# Patient Record
Sex: Male | Born: 1940
Health system: Southern US, Community
[De-identification: ages and names within clinical notes are randomized; demographics above are authoritative.]

## PROBLEM LIST (undated history)

## (undated) DIAGNOSIS — M549 Dorsalgia, unspecified: Secondary | ICD-10-CM

## (undated) DIAGNOSIS — G8929 Other chronic pain: Secondary | ICD-10-CM

## (undated) DIAGNOSIS — N189 Chronic kidney disease, unspecified: Secondary | ICD-10-CM

## (undated) DIAGNOSIS — I10 Essential (primary) hypertension: Secondary | ICD-10-CM

## (undated) DIAGNOSIS — I4891 Unspecified atrial fibrillation: Secondary | ICD-10-CM

## (undated) DIAGNOSIS — N182 Chronic kidney disease, stage 2 (mild): Secondary | ICD-10-CM

## (undated) DIAGNOSIS — G459 Transient cerebral ischemic attack, unspecified: Secondary | ICD-10-CM

## (undated) DIAGNOSIS — I639 Cerebral infarction, unspecified: Secondary | ICD-10-CM

## (undated) DIAGNOSIS — N200 Calculus of kidney: Secondary | ICD-10-CM

## (undated) HISTORY — PX: CARDIAC CATHETERIZATION: SHX172

## (undated) HISTORY — PX: TONSILLECTOMY: SUR1361

---

## 2002-10-30 ENCOUNTER — Inpatient Hospital Stay (HOSPITAL_COMMUNITY): Admission: AD | Admit: 2002-10-30 | Discharge: 2002-11-04 | Payer: Self-pay | Admitting: *Deleted

## 2002-11-01 HISTORY — PX: CARDIOVERSION: SHX1299

## 2003-01-18 ENCOUNTER — Ambulatory Visit (HOSPITAL_COMMUNITY): Admission: RE | Admit: 2003-01-18 | Discharge: 2003-01-18 | Payer: Self-pay | Admitting: *Deleted

## 2006-09-14 ENCOUNTER — Emergency Department (HOSPITAL_COMMUNITY): Admission: EM | Admit: 2006-09-14 | Discharge: 2006-09-14 | Payer: Self-pay | Admitting: Emergency Medicine

## 2009-06-24 ENCOUNTER — Ambulatory Visit (HOSPITAL_COMMUNITY): Admission: RE | Admit: 2009-06-24 | Discharge: 2009-06-24 | Payer: Self-pay | Admitting: Cardiology

## 2009-09-09 ENCOUNTER — Emergency Department (HOSPITAL_COMMUNITY): Admission: EM | Admit: 2009-09-09 | Discharge: 2009-09-09 | Payer: Self-pay | Admitting: Emergency Medicine

## 2010-05-17 LAB — URINALYSIS, ROUTINE W REFLEX MICROSCOPIC
Bilirubin Urine: NEGATIVE
Leukocytes, UA: NEGATIVE
Specific Gravity, Urine: 1.024 (ref 1.005–1.030)

## 2010-05-17 LAB — DIFFERENTIAL
Basophils Absolute: 0.1 10*3/uL (ref 0.0–0.1)
Basophils Relative: 1 % (ref 0–1)
Eosinophils Absolute: 0.1 10*3/uL (ref 0.0–0.7)
Lymphs Abs: 1.6 10*3/uL (ref 0.7–4.0)
Monocytes Absolute: 0.7 10*3/uL (ref 0.1–1.0)
Monocytes Relative: 8 % (ref 3–12)

## 2010-05-17 LAB — URINE MICROSCOPIC-ADD ON

## 2010-05-17 LAB — BASIC METABOLIC PANEL
BUN: 20 mg/dL (ref 6–23)
Chloride: 115 mEq/L — ABNORMAL HIGH (ref 96–112)
Creatinine, Ser: 1.66 mg/dL — ABNORMAL HIGH (ref 0.4–1.5)
GFR calc Af Amer: 50 mL/min — ABNORMAL LOW (ref >60)
Glucose, Bld: 118 mg/dL — ABNORMAL HIGH (ref 70–99)
Potassium: 4.6 mEq/L (ref 3.5–5.1)
Sodium: 143 mEq/L (ref 135–145)

## 2010-05-17 LAB — CBC
MCH: 33.8 pg (ref 26.0–34.0)
RBC: 4.42 MIL/uL (ref 4.22–5.81)
WBC: 9.7 10*3/uL (ref 4.0–10.5)

## 2010-07-17 NOTE — Cardiovascular Report (Signed)
NAME:  Ross Lewis, FREDIN NO.:  [id number]   MEDICAL RECORD NO.:  [id number]                   PATIENT TYPE:  AMB   LOCATION:  ENDO                                 FACILITY:  [facility]   PHYSICIAN:  Darlin Priestly, M.D.             DATE OF BIRTH:  08-18-1940   DATE OF PROCEDURE:  01/18/2003  DATE OF DISCHARGE:                              CARDIAC CATHETERIZATION   PROCEDURES PERFORMED:  TEE-guided DC cardioversion.   ATTENDING:  Darlin Priestly, M.D.   COMPLICATIONS:  None.   INDICATIONS:  [name] is a 70 year old male patient of Evelena Peat,  M.D. with history of atrial fibrillation, who has undergone a cardiac  catheterization with no significant CAD and mildly depressed EF.  He has  been rate controlled and maintained on anticoagulation.  He is now brought  in for TEE-guided DC cardioversion in attempt to establish normal sinus  rhythm.   DESCRIPTION OF OPERATION:  After giving informed written consent, patient  brought to endoscopy suite in a fasting state.  Then received 75 mg of  Demerol and 1 mg Versed.  Underwent a successful noncomplicated  transesophageal echocardiogram.  There was normal LV size with mildly  depressed LV systolic function, estimated EF of 40-45% with mild global  hypokinesis.  The aortic valve appears to be structurally normal with  trivial aortic regurgitation.  Structurally normal mitral valve with mild  mitral regurgitation.  Structurally normal tricuspid valve with mild  tricuspid regurgitation.  There is trivial pulmonic regurgitation.  There is  no evidence of intracardiac mass or thrombus noted.  There is a small atrial  septal defect noted with minimal right to left and minimal left to right  shunting by color and contrast echocardiogram.  Normal descending thoracic  aorta.   Following the TEE, biphasic defibrillator pads were then placed on the  anterior and posterior chest wall.  Anesthesia was then  administered by Dr.  Randa Evens.  The patient then received one DC shock at 100 joules with biphasic  defibrillator with conversion to sinus bradycardia at a rate of 68.  He  maintained that and was stable.  He awoke in satisfactory condition and was  transferred to recovery room.   CONCLUSION:  Successful TEE-guided DC cardioversion.                                               Darlin Priestly, M.D.    RHM/MEDQ  D:  01/18/2003  T:  01/18/2003  Job:  [id number]   cc:   Evelena Peat, M.D.  [address]  [place]  [place] [address]  Fax: [phone]

## 2010-07-17 NOTE — Discharge Summary (Signed)
NAME:  Ross Lewis, Ross Lewis NO.:  [id number]   MEDICAL RECORD NO.:  [id number]                   PATIENT TYPE:  INP   LOCATION:  2024                                 FACILITY:  [facility]   PHYSICIAN:  Ross Lewis, M.D.             DATE OF BIRTH:  [birth year]   DATE OF ADMISSION:  10/30/2002  DATE OF DISCHARGE:  11/04/2002                                 DISCHARGE SUMMARY   PRIMARY CARE PHYSICIAN:  Ross Lewis, M.D.   ADMISSION DIAGNOSES:  1. Atrial flutter, newly diagnosed.  2. Status post cardiac catheterization.   DISCHARGE DIAGNOSES:  1. Atrial flutter, newly diagnosed.  2. Status post cardiac catheterization on November 01, 2002, revealing     normal coronary arteries, normal left ventricular function.  3. Mitral regurgitation 2+ at cardiac catheterization.  4. Status post Coumadin load.   HISTORY OF PRESENT ILLNESS:  Ross Lewis is a 70 year old, married white male  who was seen by Ross Lewis in the office for initial evaluation on October 30, 2002.  At that time he was referred by Dr. Evelena Lewis.  At that  point he had no significant past medical history.  He had seen Ross Lewis  on that past Thursday for a routine physical exam and was noted to have an  irregular heart rate.  Rhythm strips were performed and confirmed that the  patient was in atrial flutter with ventricular response of approximately  100.  He was given a prescription for Toprol and Coumadin and an appointment  was set up with Ross Lewis on October 30, 2002.  However the patient elected  not to begin the medications since he had the upcoming appointment with a  cardiologist.  He did not remember the origin of symptoms and was completely  asymptomatic from a cardiovascular stand-point.  He denied any chest pain,  shortness of breath, orthopnea, paroxysmal nocturnal dyspnea.  He had no  history of syncope or presyncope.  There was no known history of coronary  artery  disease, no history of hypothyroidism or hyperlipidemia.  He did take  an aspirin a day.   PHYSICAL EXAMINATION:  GENERAL: He was hemodynamically stable.  VITAL SIGNS: Blood pressure 140/90, heart rate was 111 and irregular.  Exam  is notable for irregular heart rhythm but otherwise was not significant.  EKG revealed atrial fibrillation/flutter, rate of 111 beats per minute and  nonspecific ST-T changes.   At that point he was advised to be seen in the office.  It was felt that Mr.  Lewis had reverted back to his atrial fibrillation/flutter with rapid  ventricular response.  Ross Lewis had a long discussion with him and  discussed the fact that he felt we needed to admit him to [facility] for anticoagulation and probable cardiac catheterization prior to  the Coumadin load and rate control.  He would have labs performed as an  outpatient.  He had some lab work which Ross Lewis did review and it had  been essentially normal.  We discussed our plans for admission to [facility]  for IV heparin, cardiac catheterization, and Coumadin load.  The  risks and benefits of the procedure were discussed with the patient and his  wife and they were willing to proceed.   HOSPITAL COURSE:  On October 31, 2002, Ross Lewis was asymptomatic.  He was  feeling well and was clinically stable.  He remained in atrial flutter on  telemetry.  He was electing cardiac catheterization.   On November 01, 2002, Ross Lewis underwent cardiac catheterization by Dr.  Nanetta Lewis.  He was found to have normal coronary arteries, ejection  fraction was 40 to 45% and he had two plus mitral regurgitation.  It was  felt the LV dysfunction was secondary to his rhythm abnormalities.  Dr.  Allyson Lewis planned to continue IV heparin post-procedure with a Coumadin load.  Plans are for discharge home with INR greater than 2.  Will consider PE  cardioversion in six to eight weeks time.  The patient had no  complications.   The next day Ross Lewis remained stable.  He had no problems with his groin  site.  He remained hemodynamically stable.  He did remain in atrial flutter  in about the 70s.  Medications were adjusted for rate control.  It was found  his insurance would cover for Lovenox and was discussed with the patient and  his wife.  The possibility of being discharged home with Lovenox injections,  Coumadin was loaded.  Ultimately they decided this was the best option.   Therefore on November 04, 2002, he was stable.  Temperature 98.1, pulse 68,  blood pressure 148/80, oxygen saturation 97% on room air.  INR is at 1.3.  CBC stable.  He remains in atrial flutter at this time on telemetry.  Right  groin is well-healed without ecchymosis or hematoma.  At this point he was  seen and evaluated by Ross Lewis who deemed the patient was ready for  discharge home with Lovenox and Coumadin cross-over therapy.   HOSPITAL CONSULTATIONS:  None.   PROCEDURES:  Cardiac catheterization performed on November 01, 2002, by Dr.  Nanetta Lewis.  This revealed normal coronary arteries and 2+ mitral  regurgitation.  Ejection fraction was 40 to 45%.  The patient tolerated the  procedure well.  He had no complications.  It was felt that the mild left  ventricular dysfunction was probably related to the arrhythmia.  Plans for  anticoagulation process and to discharge home.  Possible TE cardioversion in  six to eight week's time.   LABORATORY DATA:  Total cholesterol 142, triglycerides 76, HDL 36, LDL 91.  Cardiac enzymes showed CKs of 54, 69 and 56, MB 1.6, 1.3, 1.2.  Troponins  0.02, 0.01, 0.02.  Sodium 143, potassium 4, chloride 114, CO2 22, glucose  103, BUN 17, creatinine 1.2.  Admission pro time was 14.5, INR 1.2, PTT was  elevated at 147 with intravenous heparin.  INRs were 1.2, 1.2, 1.2, and 1.3 at discharge.  White count 5100, hemoglobin 15.3, hematocrit 44.8, platelets  183,000 and these  remained within that range throughout the hospitalization  with no significant variation.   EKG on November 02, 2002 shows atrial flutter, 88 beats per minute.  No  specific ST-T changes.  Telemetry throughout the hospitalization continued to show atrial flutter.   DISCHARGE MEDICATIONS:  1.  Coumadin 5 mg once daily.  2. Toprol XL 50 mg once daily.  3. Cardizem CD 180 mg daily.  4. Lovenox 85 units subcu q.12h.   To have his blood drawn to check a PT/INR at 0800 hours on Tuesday,  November 06, 2002, get the results by Tuesday at noon for further dosing and  instructions of Coumadin.   On Tuesday call [phone] to make an appointment to see Ross Lewis in two  weeks.          Mary B. Easley, P.A.-C.                   Ross Lewis, M.D.    MBE/MEDQ  D:  11/14/2002  T:  11/15/2002  Job:  [id number]   cc:   Ross Lewis, M.D.  [address]  [place]  [place] [address]  Fax: [phone]

## 2010-07-17 NOTE — Cardiovascular Report (Signed)
NAME:  Ross Lewis, Ross Lewis NO.:  [id number]   MEDICAL RECORD NO.:  [id number]                   PATIENT TYPE:  INP   LOCATION:  2024                                 FACILITY:  [facility]   PHYSICIAN:  Nanetta Batty, M.D.                DATE OF BIRTH:  1940/12/19   DATE OF PROCEDURE:  DATE OF DISCHARGE:                              CARDIAC CATHETERIZATION   d   INDICATIONS:  [name] is a 70 year old gentleman who was admitted on  August 31 with new onset atrial flutter.  He denied chest pain.  He will be  ruled out for myocardial infarction.  He presents for diagnostic coronary  arteriography prior to Coumadin anticoagulation.   PROCEDURE DESCRIPTION:  The patient was brought to the second floor [facility] in the postabsorptive state.  He was  premedicated with p.o. Valium.  His right groin was prepped and shaved in  the usual sterile fashion.  1% Xylocaine was used for local anesthesia.  A 6-  French sheath was inserted into the right femoral artery using standard  Seldinger technique.  6-French right and left Judkins diagnostic catheters  along with a 6-French pigtail catheter were used for selective coronary  angiography, left ventriculography, respectively.  Omnipaque dye was used  for the entirety of the case.  Retrograde aorta, left ventricular, and  pullback pressures were recorded.   HEMODYNAMICS:  1. Aortic systolic pressure 124, diastolic pressure 77.  2. Left ventricular systolic pressure 125 and diastolic pressure 14.   SELECTIVE CORONARY ANGIOGRAPHY:  1. Left main:  Normal.  2. LAD:  Normal.  3. Left circumflex:  Nondominant and normal.  4. Right coronary artery:  Dominant and normal.   LEFT VENTRICULOGRAPHY:  RAO left ventriculogram was performed using 25 mL of  Omnipaque dye at 12 mL/second in each view.  The overall LV EF was estimated  approximately 40-45% with mild global hypokinesia and 2+  mitral  regurgitation.   IMPRESSION:  [name] has new onset atrial flutter, normal coronary  arteries, and mild left ventricular dysfunction with mitral regurgitation.  I believe his left ventricular dysfunction is most likely related to his  arrhythmia.  The sheaths were removed after an ACT was measured.  Pressure  was held on the groin to achieve  hemostasis.  The patient left the laboratory in stable condition.  Plans  will be to restart heparin in four hours and begin ___________  anticoagulation tonight.  He will be discharged home once his INR is greater  than 2.0.  Plans will be for outpatient TEE, DC cardioversion in six to  eight weeks.  He left the laboratory in stable condition.  Nanetta Batty, M.D.    Cordelia Pen  D:  11/01/2002  T:  11/02/2002  Job:  [id number]   cc:   Cath Lab   [facility]   Evelena Peat, M.D.  [address]  [place]  [place] [address]  Fax: [phone]

## 2010-12-14 LAB — URINE MICROSCOPIC-ADD ON

## 2010-12-14 LAB — URINALYSIS, ROUTINE W REFLEX MICROSCOPIC
Leukocytes, UA: NEGATIVE
Specific Gravity, Urine: 1.034 — ABNORMAL HIGH

## 2010-12-14 LAB — BASIC METABOLIC PANEL
BUN: 22
CO2: 24
Calcium: 9.1
Chloride: 104
Creatinine, Ser: 1.75 — ABNORMAL HIGH
GFR calc Af Amer: 47 — ABNORMAL LOW
GFR calc non Af Amer: 39 — ABNORMAL LOW
Glucose, Bld: 135 — ABNORMAL HIGH
Potassium: 4.4
Sodium: 135

## 2011-03-09 DIAGNOSIS — I4891 Unspecified atrial fibrillation: Secondary | ICD-10-CM | POA: Diagnosis not present

## 2011-03-09 DIAGNOSIS — Z7901 Long term (current) use of anticoagulants: Secondary | ICD-10-CM | POA: Diagnosis not present

## 2011-03-10 DIAGNOSIS — N281 Cyst of kidney, acquired: Secondary | ICD-10-CM | POA: Diagnosis not present

## 2011-03-10 DIAGNOSIS — N401 Enlarged prostate with lower urinary tract symptoms: Secondary | ICD-10-CM | POA: Diagnosis not present

## 2011-03-10 DIAGNOSIS — N400 Enlarged prostate without lower urinary tract symptoms: Secondary | ICD-10-CM | POA: Diagnosis not present

## 2011-04-20 DIAGNOSIS — Z7901 Long term (current) use of anticoagulants: Secondary | ICD-10-CM | POA: Diagnosis not present

## 2011-04-20 DIAGNOSIS — I4891 Unspecified atrial fibrillation: Secondary | ICD-10-CM | POA: Diagnosis not present

## 2011-05-13 DIAGNOSIS — E782 Mixed hyperlipidemia: Secondary | ICD-10-CM | POA: Diagnosis not present

## 2011-05-13 DIAGNOSIS — I4891 Unspecified atrial fibrillation: Secondary | ICD-10-CM | POA: Diagnosis not present

## 2011-05-13 DIAGNOSIS — I10 Essential (primary) hypertension: Secondary | ICD-10-CM | POA: Diagnosis not present

## 2011-06-07 DIAGNOSIS — Z7901 Long term (current) use of anticoagulants: Secondary | ICD-10-CM | POA: Diagnosis not present

## 2011-06-07 DIAGNOSIS — I4891 Unspecified atrial fibrillation: Secondary | ICD-10-CM | POA: Diagnosis not present

## 2011-07-19 DIAGNOSIS — Z7901 Long term (current) use of anticoagulants: Secondary | ICD-10-CM | POA: Diagnosis not present

## 2011-07-19 DIAGNOSIS — I4891 Unspecified atrial fibrillation: Secondary | ICD-10-CM | POA: Diagnosis not present

## 2011-08-30 DIAGNOSIS — I4891 Unspecified atrial fibrillation: Secondary | ICD-10-CM | POA: Diagnosis not present

## 2011-08-30 DIAGNOSIS — Z7901 Long term (current) use of anticoagulants: Secondary | ICD-10-CM | POA: Diagnosis not present

## 2011-09-24 ENCOUNTER — Ambulatory Visit (HOSPITAL_COMMUNITY)
Admission: RE | Admit: 2011-09-24 | Discharge: 2011-09-24 | Disposition: A | Discharge status: Home / Self Care | Payer: Medicare Other | Source: Ambulatory Visit | Attending: Internal Medicine | Admitting: Internal Medicine

## 2011-09-24 ENCOUNTER — Other Ambulatory Visit (HOSPITAL_COMMUNITY): Payer: Self-pay | Admitting: Internal Medicine

## 2011-09-24 DIAGNOSIS — R413 Other amnesia: Secondary | ICD-10-CM

## 2011-09-24 DIAGNOSIS — R42 Dizziness and giddiness: Secondary | ICD-10-CM | POA: Insufficient documentation

## 2011-09-24 DIAGNOSIS — I4891 Unspecified atrial fibrillation: Secondary | ICD-10-CM | POA: Diagnosis not present

## 2011-09-24 DIAGNOSIS — Z8673 Personal history of transient ischemic attack (TIA), and cerebral infarction without residual deficits: Secondary | ICD-10-CM | POA: Diagnosis not present

## 2011-09-24 DIAGNOSIS — Z7901 Long term (current) use of anticoagulants: Secondary | ICD-10-CM | POA: Diagnosis not present

## 2011-09-24 DIAGNOSIS — R51 Headache: Secondary | ICD-10-CM | POA: Insufficient documentation

## 2011-09-24 DIAGNOSIS — I6350 Cerebral infarction due to unspecified occlusion or stenosis of unspecified cerebral artery: Secondary | ICD-10-CM | POA: Diagnosis not present

## 2011-10-11 DIAGNOSIS — Z7901 Long term (current) use of anticoagulants: Secondary | ICD-10-CM | POA: Diagnosis not present

## 2011-10-11 DIAGNOSIS — I4891 Unspecified atrial fibrillation: Secondary | ICD-10-CM | POA: Diagnosis not present

## 2011-11-18 DIAGNOSIS — I4891 Unspecified atrial fibrillation: Secondary | ICD-10-CM | POA: Diagnosis not present

## 2011-11-18 DIAGNOSIS — G459 Transient cerebral ischemic attack, unspecified: Secondary | ICD-10-CM | POA: Diagnosis not present

## 2011-11-18 DIAGNOSIS — I10 Essential (primary) hypertension: Secondary | ICD-10-CM | POA: Diagnosis not present

## 2011-11-22 DIAGNOSIS — I4891 Unspecified atrial fibrillation: Secondary | ICD-10-CM | POA: Diagnosis not present

## 2011-11-22 DIAGNOSIS — Z7901 Long term (current) use of anticoagulants: Secondary | ICD-10-CM | POA: Diagnosis not present

## 2011-12-30 DIAGNOSIS — Z7901 Long term (current) use of anticoagulants: Secondary | ICD-10-CM | POA: Diagnosis not present

## 2011-12-30 DIAGNOSIS — I4891 Unspecified atrial fibrillation: Secondary | ICD-10-CM | POA: Diagnosis not present

## 2012-02-10 DIAGNOSIS — Z7901 Long term (current) use of anticoagulants: Secondary | ICD-10-CM | POA: Diagnosis not present

## 2012-02-10 DIAGNOSIS — I4891 Unspecified atrial fibrillation: Secondary | ICD-10-CM | POA: Diagnosis not present

## 2012-03-17 DIAGNOSIS — I4891 Unspecified atrial fibrillation: Secondary | ICD-10-CM | POA: Diagnosis not present

## 2012-03-17 DIAGNOSIS — Z7901 Long term (current) use of anticoagulants: Secondary | ICD-10-CM | POA: Diagnosis not present

## 2012-04-27 DIAGNOSIS — I4891 Unspecified atrial fibrillation: Secondary | ICD-10-CM | POA: Diagnosis not present

## 2012-04-27 DIAGNOSIS — Z7901 Long term (current) use of anticoagulants: Secondary | ICD-10-CM | POA: Diagnosis not present

## 2012-04-29 HISTORY — PX: TRANSTHORACIC ECHOCARDIOGRAM: SHX275

## 2012-05-01 ENCOUNTER — Emergency Department (HOSPITAL_COMMUNITY): Payer: Medicare Other

## 2012-05-01 ENCOUNTER — Encounter (HOSPITAL_COMMUNITY): Payer: Self-pay | Admitting: Vascular Surgery

## 2012-05-01 ENCOUNTER — Inpatient Hospital Stay (HOSPITAL_COMMUNITY)
Admission: EM | Admit: 2012-05-01 | Discharge: 2012-05-03 | DRG: 066 | Disposition: A | Discharge status: Home / Self Care | Payer: Medicare Other | Attending: Internal Medicine | Admitting: Internal Medicine

## 2012-05-01 DIAGNOSIS — M79609 Pain in unspecified limb: Secondary | ICD-10-CM | POA: Diagnosis not present

## 2012-05-01 DIAGNOSIS — M545 Low back pain, unspecified: Secondary | ICD-10-CM | POA: Diagnosis present

## 2012-05-01 DIAGNOSIS — E785 Hyperlipidemia, unspecified: Secondary | ICD-10-CM | POA: Diagnosis present

## 2012-05-01 DIAGNOSIS — I4891 Unspecified atrial fibrillation: Secondary | ICD-10-CM | POA: Diagnosis present

## 2012-05-01 DIAGNOSIS — Z7901 Long term (current) use of anticoagulants: Secondary | ICD-10-CM | POA: Diagnosis not present

## 2012-05-01 DIAGNOSIS — M6281 Muscle weakness (generalized): Secondary | ICD-10-CM | POA: Diagnosis not present

## 2012-05-01 DIAGNOSIS — Z79899 Other long term (current) drug therapy: Secondary | ICD-10-CM

## 2012-05-01 DIAGNOSIS — R5383 Other fatigue: Secondary | ICD-10-CM | POA: Diagnosis not present

## 2012-05-01 DIAGNOSIS — Z8673 Personal history of transient ischemic attack (TIA), and cerebral infarction without residual deficits: Secondary | ICD-10-CM

## 2012-05-01 DIAGNOSIS — I10 Essential (primary) hypertension: Secondary | ICD-10-CM | POA: Diagnosis present

## 2012-05-01 DIAGNOSIS — R29898 Other symptoms and signs involving the musculoskeletal system: Secondary | ICD-10-CM | POA: Diagnosis not present

## 2012-05-01 DIAGNOSIS — I6350 Cerebral infarction due to unspecified occlusion or stenosis of unspecified cerebral artery: Secondary | ICD-10-CM | POA: Diagnosis not present

## 2012-05-01 DIAGNOSIS — G459 Transient cerebral ischemic attack, unspecified: Secondary | ICD-10-CM

## 2012-05-01 DIAGNOSIS — I482 Chronic atrial fibrillation, unspecified: Secondary | ICD-10-CM

## 2012-05-01 DIAGNOSIS — R29818 Other symptoms and signs involving the nervous system: Secondary | ICD-10-CM | POA: Diagnosis not present

## 2012-05-01 DIAGNOSIS — R404 Transient alteration of awareness: Secondary | ICD-10-CM | POA: Diagnosis not present

## 2012-05-01 DIAGNOSIS — R5381 Other malaise: Secondary | ICD-10-CM | POA: Diagnosis not present

## 2012-05-01 HISTORY — DX: Cerebral infarction, unspecified: I63.9

## 2012-05-01 HISTORY — DX: Unspecified atrial fibrillation: I48.91

## 2012-05-01 HISTORY — DX: Other chronic pain: G89.29

## 2012-05-01 HISTORY — DX: Dorsalgia, unspecified: M54.9

## 2012-05-01 HISTORY — DX: Essential (primary) hypertension: I10

## 2012-05-01 HISTORY — DX: Transient cerebral ischemic attack, unspecified: G45.9

## 2012-05-01 HISTORY — DX: Calculus of kidney: N20.0

## 2012-05-01 LAB — POCT I-STAT TROPONIN I: Troponin i, poc: 0.01 ng/mL (ref 0.00–0.08)

## 2012-05-01 LAB — URINALYSIS, ROUTINE W REFLEX MICROSCOPIC
Glucose, UA: NEGATIVE mg/dL
Ketones, ur: NEGATIVE mg/dL
Leukocytes, UA: NEGATIVE
Nitrite: NEGATIVE
Protein, ur: NEGATIVE mg/dL
Urobilinogen, UA: 0.2 mg/dL (ref 0.0–1.0)

## 2012-05-01 LAB — PROTIME-INR: Prothrombin Time: 22.4 seconds — ABNORMAL HIGH (ref 11.6–15.2)

## 2012-05-01 LAB — CBC WITH DIFFERENTIAL/PLATELET
Eosinophils Absolute: 0.3 10*3/uL (ref 0.0–0.7)
Eosinophils Relative: 4 % (ref 0–5)
Hemoglobin: 14.5 g/dL (ref 13.0–17.0)
Lymphocytes Relative: 31 % (ref 12–46)
MCH: 32.5 pg (ref 26.0–34.0)
MCHC: 33.4 g/dL (ref 30.0–36.0)
Monocytes Absolute: 0.6 10*3/uL (ref 0.1–1.0)
Monocytes Relative: 8 % (ref 3–12)
Neutrophils Relative %: 56 % (ref 43–77)
Platelets: 168 10*3/uL (ref 150–400)
RBC: 4.46 MIL/uL (ref 4.22–5.81)

## 2012-05-01 LAB — GLUCOSE, CAPILLARY: Glucose-Capillary: 93 mg/dL (ref 70–99)

## 2012-05-01 LAB — COMPREHENSIVE METABOLIC PANEL
AST: 20 U/L (ref 0–37)
Albumin: 3.4 g/dL — ABNORMAL LOW (ref 3.5–5.2)
BUN: 16 mg/dL (ref 6–23)
Calcium: 9 mg/dL (ref 8.4–10.5)
Chloride: 106 mEq/L (ref 96–112)
Creatinine, Ser: 1.06 mg/dL (ref 0.50–1.35)
Total Protein: 6.6 g/dL (ref 6.0–8.3)

## 2012-05-01 LAB — APTT: aPTT: 39 seconds — ABNORMAL HIGH (ref 24–37)

## 2012-05-01 NOTE — ED Provider Notes (Signed)
Ross Lewis is a 72 y.o. male who states that he "trouble using his right leg" at 4:45 PM today, while at work. His wife feels that he had an unsteady gait, earlier this morning, before he went to work. That was around 6 AM. The patient went home, after he had the trouble at work. He came here by EMS, for evaluation. 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 There is no report of brain CVA, or TIA. The patient has been otherwise well. He ate breakfast and lunch.  Exam alert, calm, cooperative. Heart, irregular rhythm. No murmur. Lungs clear anteriorly. Abdomen soft, nontender. Neurologic alert, oriented x3, behavior and affect is appropriate. Cranial nerves 2-12 are grossly intact. Strength and sensation is normal in the arms, and legs bilaterally. On finger to nose testing there is no dysmetria. On heel-to-shin testing there is no discoordination.  21:00- he does not meet the criteria for code stroke; due to greater than 8 hours, since onset of symptoms.  00:05- he was unable to ambulate normally secondary to clumsiness of the right leg  Assessment: Right leg weakness, possibly due to to acute, left brain stroke. Patient needs further assessment with MRI, carotid Dopplers, and cardiac echo. We'll arrange for admission Findings discussed with family.  Medical screening examination/treatment/procedure(s) were conducted as a shared visit with non-physician practitioner(s) and myself.  I personally evaluated the patient during the encounter  Flint Melter, MD 05/03/12 2221

## 2012-05-01 NOTE — ED Notes (Signed)
Pt ambulated with assistance. Pt reports that he still felt off but was able to bear weight on affected leg.

## 2012-05-01 NOTE — ED Provider Notes (Signed)
History     CSN: [id number]  Arrival date & time 05/01/12  1906   First MD Initiated Contact with Patient 05/01/12 1910      Chief Complaint  Patient presents with  . Gait Problem    (Consider location/radiation/quality/duration/timing/severity/associated sxs/prior treatment) HPI Comments: Patient with history of A fib, TIA, and HTN presents after an episode of unsteady gait 3 hours ago. Patient states "I felt like my right leg wasn't going where it wanted to go and I was stumbling and bumping into things". Patient experienced a similar episode this AM after waking which resolved prior to leaving for work. Wife states patient was complaining of a foreign body sensation in his eye on Thursday which the patient states has resolved. Patient denies fevers, visual disturbances, tinnitus, difficulty swallowing, facial droop or dysphagia, CP, SOB, N/V/D, numbness or tingling in his extremities, extremity weakness, and urinary symptoms.  The history is provided by the patient. No language interpreter was used.    Past Medical History  Diagnosis Date  . TIA (transient ischemic attack)   . Atrial fibrillation   . Hypertension   . Stroke   . Nephrolithiasis   . Chronic back pain     Past Surgical History  Procedure Laterality Date  . Cardiac catheterization    . Cardioversion    . Tonsillectomy      No family history on file.  History  Substance Use Topics  . Smoking status: Never Smoker   . Smokeless tobacco: Not on file  . Alcohol Use: No    Review of Systems  Constitutional: Negative for fever and chills.  HENT: Negative for hearing loss, trouble swallowing, neck pain, neck stiffness, voice change and tinnitus.   Eyes: Negative for pain, discharge and visual disturbance.  Respiratory: Negative for chest tightness and shortness of breath.   Cardiovascular: Negative for chest pain and palpitations.  Gastrointestinal: Negative for nausea, vomiting and diarrhea.   Genitourinary: Negative.   Musculoskeletal: Negative for back pain.  Skin: Negative for color change.  Neurological: Negative for dizziness, syncope, speech difficulty, weakness, light-headedness, numbness and headaches.  Psychiatric/Behavioral: Negative for confusion and decreased concentration.    Allergies  Review of patient's allergies indicates no known allergies.  Home Medications   Current Outpatient Rx  Name  Route  Sig  Dispense  Refill  . aspirin EC 81 MG tablet   Oral   Take 81 mg by mouth every other day.          . diltiazem (CARDIZEM CD) 180 MG 24 hr capsule   Oral   Take 180 mg by mouth daily.         . metoprolol succinate (TOPROL-XL) 50 MG 24 hr tablet   Oral   Take 50 mg by mouth daily. Take with or immediately following a meal.         . warfarin (COUMADIN) 5 MG tablet   Oral   Take 5 mg by mouth daily.           BP 158/76  Pulse 107  Temp(Src) 99 F (37.2 C) (Oral)  Resp 19  SpO2 98%  Physical Exam  Nursing note and vitals reviewed. Constitutional: He is oriented to person, place, and time. He appears well-developed and well-nourished. No distress.  Gross hearing limited; can hear finger rubbing to 3 ft in R ear and to 1 ft in L ear  HENT:  Head: Normocephalic and atraumatic.  Right Ear: Tympanic membrane, external ear and ear canal  normal.  Left Ear: Tympanic membrane, external ear and ear canal normal.  Nose: Nose normal.  Mouth/Throat: Oropharynx is clear and moist. No oropharyngeal exudate.  Symmetric rise of uvula with phonation  Eyes: Conjunctivae and EOM are normal. Pupils are equal, round, and reactive to light. Right eye exhibits no discharge. Left eye exhibits no discharge. No scleral icterus.  Neck: Normal range of motion. Neck supple.  Cardiovascular: Normal rate and intact distal pulses.   Irregular rhythm  Pulmonary/Chest: Effort normal and breath sounds normal. No respiratory distress. He has no wheezes. He has no  rales.  Abdominal: Soft. Bowel sounds are normal. He exhibits no distension. There is no tenderness. There is no rebound and no guarding.  Musculoskeletal: Normal range of motion. He exhibits no edema and no tenderness.  Lymphadenopathy:    He has no cervical adenopathy.  Neurological: He is alert and oriented to person, place, and time. He has normal strength and normal reflexes. No cranial nerve deficit or sensory deficit. He exhibits normal muscle tone.  Can differentiate between sharp and dull; rapid alternating movements and point to point intact. Patient answers questions appropriately and follows simple commands.  Skin: Skin is warm and dry. No rash noted. He is not diaphoretic. No erythema.  Psychiatric: He has a normal mood and affect. His behavior is normal.    ED Course  Procedures (including critical care time)  Labs Reviewed  PROTIME-INR - Abnormal; Notable for the following:    Prothrombin Time 22.4 (*)    INR 2.06 (*)    All other components within normal limits  APTT - Abnormal; Notable for the following:    aPTT 39 (*)    All other components within normal limits  COMPREHENSIVE METABOLIC PANEL - Abnormal; Notable for the following:    Glucose, Bld 102 (*)    Albumin 3.4 (*)    GFR calc non Af Amer 68 (*)    GFR calc Af Amer 78 (*)    All other components within normal limits  CBC WITH DIFFERENTIAL  ETHANOL  URINALYSIS, ROUTINE W REFLEX MICROSCOPIC  GLUCOSE, CAPILLARY  POCT I-STAT TROPONIN I   Ct Head Wo Contrast  05/01/2012  *RADIOLOGY REPORT*  Clinical Data: Right-sided weakness  CT HEAD WITHOUT CONTRAST  Technique:  Contiguous axial images were obtained from the base of the skull through the vertex without contrast.  Comparison: 09/24/2011  Findings: There is no evidence for acute hemorrhage, hydrocephalus, mass lesion, or abnormal extra-axial fluid collection.  No definite CT evidence for acute infarction.  The visualized paranasal sinuses and mastoid air cells  are clear. Old small right cerebellar infarct is unchanged.  IMPRESSION: Stable.  No acute intracranial abnormality.   Original Report Authenticated By: Kennith Center, M.D.     Date: 05/01/2012  Rate: 54  Rhythm: sinus bradycardia  QRS Axis: normal  Intervals: normal  ST/T Wave abnormalities: nonspecific T wave changes  Conduction Disutrbances:none  Narrative Interpretation: Sinus bradycardia with premature atrial complexes and nonspecific T wave changes; no STEMI or T wave inversion  Old EKG Reviewed: unchanged I have personally viewed and interpreted this EKG.   1. Right leg weakness possibly due to acute, left brain stroke   MDM  Patient with hx of atrial fibrillation and HTN presents to ED after experiencing an episode of unsteady gait with his R leg not going where he wanted it to. Stroke order set ordered for work up which was normal. CT head without evidence of hemorrhage, hydrocephalus, or mass  lesion. EKG consistent with sinus bradycardia with PACs; no STEMI or T wave inversion. Will have patient ambulate and re-evaluate to see if stable for d/c.  Patient ambulates without assistance, but appears unsteady on his feet. Occasionally slides right foot across the floor rather than lifting it off the floor as with normal ambulation. Patient states he feels his symptoms have improved slightly, but wife states he is still far from baseline. Patient will be admitted for further monitoring and evaluation. Have discussed the patient, work up and ED management with Dr. Bernell List who is in agreement.     Antony Madura, PA-C 05/02/12 1454

## 2012-05-01 NOTE — ED Notes (Signed)
Patient arrived via [facility]. Wife states she noticed the patient had unsteady gait this morning and has complained of his right eye has felt like something has been in it since Thursday. Patient has had a CVA in the past and was unaware that he was having a CVA. EMS states patient is in afib which patient has a history of. Patient is A/O.

## 2012-05-02 ENCOUNTER — Observation Stay (HOSPITAL_COMMUNITY): Payer: Medicare Other

## 2012-05-02 ENCOUNTER — Encounter (HOSPITAL_COMMUNITY): Payer: Self-pay | Admitting: *Deleted

## 2012-05-02 DIAGNOSIS — R29898 Other symptoms and signs involving the musculoskeletal system: Secondary | ICD-10-CM

## 2012-05-02 DIAGNOSIS — I6350 Cerebral infarction due to unspecified occlusion or stenosis of unspecified cerebral artery: Secondary | ICD-10-CM | POA: Diagnosis not present

## 2012-05-02 DIAGNOSIS — R29818 Other symptoms and signs involving the nervous system: Secondary | ICD-10-CM | POA: Diagnosis not present

## 2012-05-02 DIAGNOSIS — G459 Transient cerebral ischemic attack, unspecified: Secondary | ICD-10-CM

## 2012-05-02 DIAGNOSIS — I10 Essential (primary) hypertension: Secondary | ICD-10-CM | POA: Diagnosis present

## 2012-05-02 DIAGNOSIS — M545 Low back pain: Secondary | ICD-10-CM | POA: Diagnosis present

## 2012-05-02 DIAGNOSIS — I482 Chronic atrial fibrillation, unspecified: Secondary | ICD-10-CM | POA: Diagnosis present

## 2012-05-02 LAB — PROTIME-INR
INR: 2.05 — ABNORMAL HIGH (ref 0.00–1.49)
Prothrombin Time: 22.3 seconds — ABNORMAL HIGH (ref 11.6–15.2)

## 2012-05-02 LAB — HEMOGLOBIN A1C
Hgb A1c MFr Bld: 6.1 % — ABNORMAL HIGH (ref <5.7)
Mean Plasma Glucose: 128 mg/dL — ABNORMAL HIGH (ref <117)

## 2012-05-02 MED ORDER — DILTIAZEM HCL ER COATED BEADS 120 MG PO CP24
120.0000 mg | ORAL_CAPSULE | Freq: Every day | ORAL | Status: DC
  Administered 2012-05-03: 120 mg via ORAL
  Filled 2012-05-02: qty 1

## 2012-05-02 MED ORDER — ATORVASTATIN CALCIUM 10 MG PO TABS
10.0000 mg | ORAL_TABLET | Freq: Every day | ORAL | Status: DC
  Administered 2012-05-02: 10 mg via ORAL
  Filled 2012-05-02 (×2): qty 1

## 2012-05-02 MED ORDER — ASPIRIN EC 81 MG PO TBEC
81.0000 mg | DELAYED_RELEASE_TABLET | ORAL | Status: DC
  Administered 2012-05-02: 81 mg via ORAL
  Filled 2012-05-02: qty 1

## 2012-05-02 MED ORDER — METOPROLOL SUCCINATE ER 50 MG PO TB24
50.0000 mg | ORAL_TABLET | Freq: Every day | ORAL | Status: DC
  Filled 2012-05-02: qty 1

## 2012-05-02 MED ORDER — WARFARIN SODIUM 5 MG PO TABS
5.0000 mg | ORAL_TABLET | Freq: Every day | ORAL | Status: DC
  Filled 2012-05-02 (×2): qty 1

## 2012-05-02 MED ORDER — DILTIAZEM HCL ER COATED BEADS 180 MG PO CP24
180.0000 mg | ORAL_CAPSULE | Freq: Every day | ORAL | Status: DC
  Filled 2012-05-02: qty 1

## 2012-05-02 MED ORDER — METOPROLOL SUCCINATE ER 25 MG PO TB24
25.0000 mg | ORAL_TABLET | Freq: Every day | ORAL | Status: DC
  Administered 2012-05-03: 25 mg via ORAL
  Filled 2012-05-02: qty 1

## 2012-05-02 MED ORDER — WARFARIN - PHARMACIST DOSING INPATIENT: Freq: Every day | Status: DC

## 2012-05-02 NOTE — Progress Notes (Signed)
UR Completed.  Brown, Sarah Jane [phone] 05/02/2012  

## 2012-05-02 NOTE — Progress Notes (Signed)
ANTICOAGULATION CONSULT NOTE - Initial Consult  Pharmacy Consult for Coumadin Indication: atrial fibrillation  No Known Allergies   Vital Signs: Temp: 99 F (37.2 C) (03/03 1941) Temp src: Oral (03/03 1941) BP: 141/51 mmHg (03/03 2200) Pulse Rate: 63 (03/03 2200)  Labs:  Recent Labs  05/01/12 2110  HGB 14.5  HCT 43.4  PLT 168  APTT 39*  LABPROT 22.4*  INR 2.06*  CREATININE 1.06    Medical History: Past Medical History  Diagnosis Date  . TIA (transient ischemic attack)   . Atrial fibrillation   . Hypertension   . Stroke   . Nephrolithiasis   . Chronic back pain      Assessment: 72yo male c/o unsteady gait and feeling as though foreign body is in R eye, has had h/o CVA, CT clear of acute abnormality, to continue Coumadin for Afib; admitted with therapeutic INR though at low end of goal.  Goal of Therapy:  INR 2-3   Plan:  Will continue home Coumadin dose of 5mg  daily and monitor INR for dose adjustments.  Vernard Gambles, PharmD, BCPS  05/02/2012,1:10 AM

## 2012-05-02 NOTE — Progress Notes (Signed)
Tele alert notified of bradycardia into 30's nonsustained. BP stable at 111/79. Pt resting comfortably.  HR returned to 50's. MD notified and received orders for doses of metoprolol and cardizem to be decreased tomorrow. HR continues to occasionally dip into 30's nonsustained.  Will continue to monitor pt closely.

## 2012-05-02 NOTE — Progress Notes (Signed)
Pt reported that he took his metoprolol, cardizem, and coumadin this AM prior to MRI. Wife had given him his medicine. Pt instructed to NOT take meds from his supply and was instructed to tell wife. Wife currently has meds. Will f/u with wife when she returns and provide education. Will continue to monitor pt closely.

## 2012-05-02 NOTE — Progress Notes (Signed)
Wife at bedside. Provided education regarding not using home meds while in hospital. Instructed wife to take meds home. Verbalized understanding.

## 2012-05-02 NOTE — Progress Notes (Signed)
ANTICOAGULATION CONSULT NOTE - Follow Up Consult  Pharmacy Consult for Coumadin Indication: atrial fibrillation   Dosing Weight: 90 kg  Labs:  Recent Labs  05/01/12 2110 05/02/12 0640  HGB 14.5  --   HCT 43.4  --   PLT 168  --   APTT 39*  --   LABPROT 22.4* 22.3*  INR 2.06* 2.05*  CREATININE 1.06  --    Lab Results  Component Value Date   INR 2.05* 05/02/2012   INR 2.06* 05/01/2012    Medications:  Medication Sig  . aspirin EC 81 MG tablet Take 81 mg by mouth every other day.   . diltiazem (CARDIZEM CD) 180 MG 24 hr capsule Take 180 mg by mouth daily.  . metoprolol succinate (TOPROL-XL) 50 MG 24 hr tablet Take 50 mg by mouth daily. Take with or immediately following a meal.  . warfarin (COUMADIN) 5 MG tablet Take 5 mg by mouth daily.    Assessment:  73 y/o male with history atrial fibrillation and CVA who is on chronic Coumadin therapy is admitted for work-up of presumed CVA.  INR therapeutic on admission.  INR today 2.05. No bleeding complications noted   Goal of Therapy:  Target INR 2-3    Plan:  Continue Coumadin 5 mg daily.  Change INR's to Tues, Thurs, Sat.   Stramoski, Deetta Perla.D 05/02/2012, 10:35 AM

## 2012-05-02 NOTE — Progress Notes (Addendum)
[facility] PROGRESS NOTE  Ross Lewis Ross Lewis:[id number] DOB: June 11, 1940 DOA: 05/01/2012 PCP: Ross Lewis, [place] Cardiologist: Ross Nose, MD  Assessment/Plan: Right lower extremity Improved. Presumed to be from TIA. Workup pending with MRI of the brain, 2D Echo, carotid dopplers. PT/OT evaluation pending. INR therapeutic. Continue ASA. LDL 100, start low dose statin.  Chronic a-fib Rate controlled. INR therapeutic. Patient sees Dr. Rennis Lewis, cardiology as outpatient.  Lumbago Stable.  HTN Stable.  Code Status: Full code. Family Communication: Wife who is a Engineer, civil (consulting) is at bedside, updated. Disposition Plan: Pending.  Addendum: Bradycardia Patient had taken his home diltiazem and metoprolol dose.  Decrease diltiazem to 120 mg daily from 180 mg, decrease metoprolol to 25 mg daily from 50 mg.  Hold parameters written for these medications.  Consultants:  None.  Procedures:  As above.  Antibiotics:  None.  HPI/Subjective: RLE weakness improved, patient is not sure if his weakness is back to baseline.  Objective: Filed Vitals:   05/02/12 0100 05/02/12 0200 05/02/12 0219 05/02/12 0256  BP: 137/71 130/69  125/72  Pulse: 41 97  53  Temp:   97.8 F (36.6 C) 97.7 F (36.5 C)  TempSrc:    Oral  Resp: 14 17  16   Height:    5\' 8"  (1.727 m)  Weight:    90.13 kg (198 lb 11.2 oz)  SpO2: 97% 98%  98%    Intake/Output Summary (Last 24 hours) at 05/02/12 0803 Last data filed at 05/01/12 2253  Gross per 24 hour  Intake      0 ml  Output    200 ml  Net   -200 ml   Filed Weights   05/02/12 0256  Weight: 90.13 kg (198 lb 11.2 oz)    Exam: Physical Exam: General: Awake, Oriented, No acute distress. HEENT: EOMI. Neck: Supple CV: S1 and S2 Lungs: Clear to ascultation bilaterally Abdomen: Soft, Nontender, Nondistended, +bowel sounds. Ext: Good pulses. Trace edema. 5/5 motor strength in lower extremities bilaterally.  Data Reviewed: Basic Metabolic  Panel:  Recent Labs Lab 05/01/12 2110  NA 139  K 4.0  CL 106  CO2 25  GLUCOSE 102*  BUN 16  CREATININE 1.06  CALCIUM 9.0   Liver Function Tests:  Recent Labs Lab 05/01/12 2110  AST 20  ALT 18  ALKPHOS 57  BILITOT 0.4  PROT 6.6  ALBUMIN 3.4*   No results found for this basename: LIPASE, AMYLASE,  in the last 168 hours No results found for this basename: AMMONIA,  in the last 168 hours CBC:  Recent Labs Lab 05/01/12 2110  WBC 7.2  NEUTROABS 4.0  HGB 14.5  HCT 43.4  MCV 97.3  PLT 168   Cardiac Enzymes: No results found for this basename: CKTOTAL, CKMB, CKMBINDEX, TROPONINI,  in the last 168 hours BNP (last 3 results) No results found for this basename: PROBNP,  in the last 8760 hours CBG:  Recent Labs Lab 05/01/12 2113  GLUCAP 93    No results found for this or any previous visit (from the past 240 hour(s)).   Studies: Ct Head Wo Contrast  05/01/2012  *RADIOLOGY REPORT*  Clinical Data: Right-sided weakness  CT HEAD WITHOUT CONTRAST  Technique:  Contiguous axial images were obtained from the base of the skull through the vertex without contrast.  Comparison: 09/24/2011  Findings: There is no evidence for acute hemorrhage, hydrocephalus, mass lesion, or abnormal extra-axial fluid collection.  No definite CT evidence for acute infarction.  The visualized paranasal sinuses and  mastoid air cells are clear. Old small right cerebellar infarct is unchanged.  IMPRESSION: Stable.  No acute intracranial abnormality.   Original Report Authenticated By: Kennith Center, M.D.     Scheduled Meds: . aspirin EC  81 mg Oral QODAY  . diltiazem  180 mg Oral Daily  . metoprolol succinate  50 mg Oral Daily  . warfarin  5 mg Oral q1800  . Warfarin - Pharmacist Dosing Inpatient   Does not apply q1800   Continuous Infusions:   Principal Problem:   TIA (transient ischemic attack) Active Problems:   Chronic a-fib   Lumbago without sciatica   HTN  (hypertension)    Ross Lewis A, MD  [facility] Pager [phone]. If 7PM-7AM, please contact night-coverage at [email or web address], password [facility] 05/02/2012, 8:03 AM  LOS: 1 day

## 2012-05-02 NOTE — Care Management Note (Unsigned)
    Page 1 of 1   05/02/2012     3:07:26 PM   CARE MANAGEMENT NOTE 05/02/2012  Patient:  Ross Lewis, Ross Lewis   Account Number:  [id number]  Date Initiated:  05/02/2012  Documentation initiated by:  AMERSON,JULIE  Subjective/Objective Assessment:   PT ADM ON 05/01/12 WITH TIA SYMPTOMS.  PTA, PT RESIDES AT HOME WITH WIFE.     Action/Plan:   WILL FOLLOW FOR HOME NEEDS AS PT PROGRESSES.   Anticipated DC Date:  05/03/2012   Anticipated DC Plan:  HOME/SELF CARE      DC Planning Services  CM consult      Choice offered to / List presented to:             Status of service:  In process, will continue to follow Medicare Important Message given?   (If response is "NO", the following Medicare IM given date fields will be blank) Date Medicare IM given:   Date Additional Medicare IM given:    Discharge Disposition:    Per UR Regulation:  Reviewed for med. necessity/level of care/duration of stay  If discussed at Long Length of Stay Meetings, dates discussed:    Comments:

## 2012-05-02 NOTE — Evaluation (Signed)
Physical Therapy Evaluation Patient Details Name: Ross Lewis MRN: [id number] DOB: 01/26/1941 Today's Date: 05/02/2012 Time: 6578-4696 PT Time Calculation (min): 21 min  PT Assessment / Plan / Recommendation Clinical Impression  Pt is a 72 y/o male admitted with suspected TIA.  Pt reports weakness in RLE and visual deficits yesterday.  Today pt reporting RLE weakness resolved but the RLE does not feel like it is "normal".  Pt clarified that RLE still feels a little weak. Vision back to normal.  Pt presents with mild balance deficits usually when pt is dependent on RLE for balance.  Pt also presents with limited ROM of Right Cervical Rotations.  Pt had not noticed this before PT eval and describes soreness in R side of neck when attempting to turn his head to the right.  Acute PT will continue to follow pt to progress balance and strength in RLE.      PT Assessment  Patient needs continued PT services    Follow Up Recommendations  Outpatient PT;Supervision - Intermittent (May need OPPT follow-up for balance)    Does the patient have the potential to tolerate intense rehabilitation      Barriers to Discharge None NONE    Equipment Recommendations  None recommended by PT    Recommendations for Other Services     Frequency Min 4X/week    Precautions / Restrictions Precautions Precautions: Fall Restrictions Weight Bearing Restrictions: No   Pertinent Vitals/Pain No c/o pain.        Mobility  Bed Mobility Bed Mobility: Supine to Sit;Sit to Supine Supine to Sit: 7: Independent;HOB flat Sit to Supine: 7: Independent;HOB flat Details for Bed Mobility Assistance: no assistance cueing or rails required.  Transfers Transfers: Sit to Stand;Stand to Sit;Stand Pivot Transfers Sit to Stand: 7: Independent;5: Supervision;From bed;Without upper extremity assist Stand to Sit: 7: Independent;5: Supervision;To bed;Without upper extremity assist Stand Pivot Transfers: 7: Independent;5:  Supervision Details for Transfer Assistance: Supervision for safety first trials of each.   Ambulation/Gait Ambulation/Gait Assistance: 7: Independent Ambulation Distance (Feet): 200 Feet Assistive device: None Ambulation/Gait Assistance Details: Supervision for safety.  Gait is a little slow but WFL.   Gait Pattern: Within Functional Limits Gait velocity: WFL General Gait Details: Pt is a little unsteady but no LOB or significant instability.  Stairs: No Wheelchair Mobility Wheelchair Mobility: No Modified Rankin (Stroke Patients Only) Pre-Morbid Rankin Score: No symptoms Modified Rankin: No significant disability    Exercises     PT Diagnosis: Other (comment);Generalized weakness  PT Problem List: Decreased strength;Decreased balance;Decreased range of motion PT Treatment Interventions: Balance training;Therapeutic activities;Therapeutic exercise;Patient/family education;Neuromuscular re-education   PT Goals Acute Rehab PT Goals PT Goal Formulation: With patient Time For Goal Achievement: 05/09/12 Potential to Achieve Goals: Good Additional Goals Additional Goal #1: Pt will perform standardized balance assessment Sharlene Motts, Tinnetti or DGI)WFL  PT Goal: Additional Goal #1 - Progress: Goal set today  Visit Information  Last PT Received On: 05/02/12 Assistance Needed: +1    Subjective Data  Subjective: I have already been up walking   Prior Functioning  Home Living Lives With: Spouse Available Help at Discharge: Family;Available PRN/intermittently Type of Home: House Home Access: Stairs to enter [employer] of Steps: 3 Entrance Stairs-Rails: None Home Layout: One level Bathroom Shower/Tub: Tub/shower unit;Door Dentist: None Prior Function Level of Independence: Independent Able to Take Stairs?: Yes Driving: Yes 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    Communication Communication: No  difficulties    Cognition  Cognition Overall Cognitive Status: Appears within functional limits for tasks assessed/performed Arousal/Alertness: Awake/alert Orientation Level: Oriented X4 / Intact Behavior During Session: [facility] for tasks performed    Extremity/Trunk Assessment Right Upper Extremity Assessment RUE ROM/Strength/Tone: Within functional levels Left Upper Extremity Assessment LUE ROM/Strength/Tone: Within functional levels Right Lower Extremity Assessment RLE ROM/Strength/Tone: Within functional levels (pt c/o LE feeling weaker than left. ) RLE Sensation: WFL - Light Touch;WFL - Proprioception RLE Coordination: WFL - gross/fine motor Left Lower Extremity Assessment LLE ROM/Strength/Tone: WFL for tasks assessed LLE Sensation: WFL - Proprioception;WFL - Light Touch LLE Coordination: WFL - gross/fine motor Trunk Assessment Trunk Assessment: Normal   Balance Balance Balance Assessed: Yes Static Standing Balance Static Standing - Comment/# of Minutes: Pt stood 1+ minutes with no LOB, Mild A-P sway noted.   Tandem Stance - Right Leg: 5 (Pt unable to maintain balance for longer than 5 seconds.  ) Tandem Stance - Left Leg: 10 (Pt with no LOB 10 seconds.) Rhomberg - Eyes Opened: 15 Rhomberg - Eyes Closed: 10 (increased A-P sway noted but pt able to maintain balance. ) Standardized Balance Assessment Standardized Balance Assessment: Berg Balance Test Berg Balance Test Sit to Stand: Able to stand without using hands and stabilize independently Standing Unsupported: Able to stand safely 2 minutes Sitting with Back Unsupported but Feet Supported on Floor or Stool: Able to sit safely and securely 2 minutes Stand to Sit: Sits safely with minimal use of hands Transfers: Able to transfer safely, minor use of hands Standing Unsupported with Eyes Closed: Able to stand 10 seconds safely Standing Ubsupported with Feet Together: Able to place feet  together independently and stand 1 minute safely From Standing, Reach Forward with Outstretched Arm: Can reach forward >12 cm safely (5") From Standing Position, Pick up Object from Floor: Able to pick up shoe safely and easily From Standing Position, Turn to Look Behind Over each Shoulder: Looks behind from both sides and weight shifts well Turn 360 Degrees: Able to turn 360 degrees safely in 4 seconds or less Standing Unsupported, Alternately Place Feet on Step/Stool: Able to stand independently and safely and complete 8 steps in 20 seconds Standing Unsupported, One Foot in Front: Loses balance while stepping or standing Standing on One Leg: Tries to lift leg/unable to hold 3 seconds but remains standing independently Total Score: 48 High Level Balance High Level Balance Activites: Direction changes;Turns;Sudden stops;Head turns;Backward walking;Side stepping High Level Balance Comments: No LOB with any high level activities.    End of Session PT - End of Session Equipment Utilized During Treatment: Gait belt Patient left: in bed;with call bell/phone within reach;with family/visitor present Nurse Communication: Mobility status  GP Functional Assessment Tool Used: Clinical judgement Functional Limitation: Mobility: Walking and moving around Mobility: Walking and Moving Around Current Status (W0981): At least 1 percent but less than 20 percent impaired, limited or restricted Mobility: Walking and Moving Around Goal Status [phone]): 0 percent impaired, limited or restricted   Ezekiah Massie 05/02/2012, 11:17 AM Senay Sistrunk L. Madyson Lukach DPT [phone]

## 2012-05-02 NOTE — Consult Note (Signed)
Referring Physician: Dr. Betti Cruz.    Chief Complaint: right leg weakness.  HPI:                                                                                                                                         Ross Lewis is an 72 y.o. male with a past medical history significant for hypertension, atrial fibrillation on coumadin, stroke without residual deficits, brought to the ED with new onset right leg weakness. Stated that he first notice the weakness of his right leg yesterday evening, but his wife said that around 6:30 am he was already " kind of dragging" the right leg when they were in the kitchen making coffee. Ross Lewis tells me that he last evening he was sitting in a couch reading a book and when he got up he was not able to use his right leg as he usually does, it was heavy and weak. Denies numbness -tingling involving the right side. No weakness of the right arm or face. No associated headache, vertigo, double vision, difficulty swallowing, language or vision impairment. INR therapeutic at 2.06  Brought to [facility] where he had CT brain that shoed no acute intracranial abnormality, but a subsequent MRI-DWI revealed a small infarction right parietal region. Carotid ultrasound unremarkable. TTE done, results pending. Total cholesterol 152, triglycerides 85, HDL 35, LDL 100. Mr. Horn stated that the right leg is getting stronger and his walking improving.  LSN: may be 6:30 am yesterday. tPA Given: no, late presentation.  Past Medical History  Diagnosis Date  . TIA (transient ischemic attack)   . Atrial fibrillation   . Hypertension   . Stroke   . Nephrolithiasis   . Chronic back pain     Past Surgical History  Procedure Laterality Date  . Cardiac catheterization    . Cardioversion    . Tonsillectomy      History reviewed. No pertinent family history. Social History:  reports that he has never smoked. He does not have any smokeless tobacco history on file. He  reports that he does not drink alcohol or use illicit drugs.  Allergies: No Known Allergies  Medications:                                                                                                                           I have reviewed the patient's  current medications.  ROS:                                                                                                                                       History obtained from the patient, wife, and chart review.  General ROS: negative for - chills, fatigue, fever, night sweats, weight gain or weight loss Psychological ROS: negative for - behavioral disorder, hallucinations, memory difficulties, mood swings or suicidal ideation Ophthalmic ROS: negative for - blurry vision, double vision, eye pain or loss of vision ENT ROS: negative for - epistaxis, nasal discharge, oral lesions, sore throat, tinnitus or vertigo Allergy and Immunology ROS: negative for - hives or itchy/watery eyes Hematological and Lymphatic ROS: negative for - bleeding problems, bruising or swollen lymph nodes Endocrine ROS: negative for - galactorrhea, hair pattern changes, polydipsia/polyuria or temperature intolerance Respiratory ROS: negative for - cough, hemoptysis, shortness of breath or wheezing Cardiovascular ROS: negative for - chest pain, dyspnea on exertion, edema or irregular heartbeat Gastrointestinal ROS: negative for - abdominal pain, diarrhea, hematemesis, nausea/vomiting or stool incontinence Genito-Urinary ROS: negative for - dysuria, hematuria, incontinence or urinary frequency/urgency Musculoskeletal ROS: negative for - joint swelling or muscular weakness Neurological ROS: as noted in HPI Dermatological ROS: negative for rash and skin lesion changes    Physical exam: pleasant male in no apparent distress. Blood pressure 111/55, pulse 53, temperature 98.8 F (37.1 C), temperature source Oral, resp. rate 18, height 5\' 8"  (1.727 m), weight  90.13 kg (198 lb 11.2 oz), SpO2 98.00%. Head: normocephalic. Neck: supple, no bruits, no JVD. Cardiac: no murmurs. Lungs: clear. Abdomen: soft, no tender, no mass. Extremities: no edema.   Neurologic Examination:                                                                                                         Mental Status: Alert, awake, oriented x 4, thought content appropriate.  Comprehension, naming, and repetition within normal limits. Speech fluent without evidence of aphasia.  Able to follow 3 step commands without difficulty. Cranial Nerves: II: Discs flat bilaterally; Visual fields grossly normal, pupils equal, round, reactive to light and accommodation III,IV, VI: ptosis not present, extra-ocular motions intact bilaterally V,VII: smile symmetric, facial light touch sensation normal bilaterally VIII: hearing normal bilaterally IX,X: gag reflex present XI: bilateral shoulder shrug XII: midline tongue extension Motor: Right : Upper extremity   5/5    Left:     Upper extremity   5/5  Lower extremity   5/5     Lower extremity   5/5 Tone and bulk:normal tone throughout; no atrophy noted Sensory: Pinprick and light touch intact throughout, bilaterally Deep Tendon Reflexes: 2+ and symmetric throughout Plantars: Right: downgoing   Left: downgoing Cerebellar: normal finger-to-nose,  normal heel-to-shin test Gait: mild dragging of the right leg. CV: pulses palpable throughout    Results for orders placed during the hospital encounter of 05/01/12 (from the past 48 hour(s))  CBC WITH DIFFERENTIAL     Status: None   Collection Time    05/01/12  9:10 PM      Result Value Range   WBC 7.2  4.0 - 10.5 K/uL   RBC 4.46  4.22 - 5.81 MIL/uL   Hemoglobin 14.5  13.0 - 17.0 g/dL   HCT 45.4  09.8 - 11.9 %   MCV 97.3  78.0 - 100.0 fL   MCH 32.5  26.0 - 34.0 pg   MCHC 33.4  30.0 - 36.0 g/dL   RDW 14.7  82.9 - 56.2 %   Platelets 168  150 - 400 K/uL   Neutrophils Relative 56  43 -  77 %   Neutro Abs 4.0  1.7 - 7.7 K/uL   Lymphocytes Relative 31  12 - 46 %   Lymphs Abs 2.3  0.7 - 4.0 K/uL   Monocytes Relative 8  3 - 12 %   Monocytes Absolute 0.6  0.1 - 1.0 K/uL   Eosinophils Relative 4  0 - 5 %   Eosinophils Absolute 0.3  0.0 - 0.7 K/uL   Basophils Relative 1  0 - 1 %   Basophils Absolute 0.0  0.0 - 0.1 K/uL  ETHANOL     Status: None   Collection Time    05/01/12  9:10 PM      Result Value Range   Alcohol, Ethyl (B) <11  0 - 11 mg/dL   Comment:            LOWEST DETECTABLE LIMIT FOR     SERUM ALCOHOL IS 11 mg/dL     FOR MEDICAL PURPOSES ONLY  PROTIME-INR     Status: Abnormal   Collection Time    05/01/12  9:10 PM      Result Value Range   Prothrombin Time 22.4 (*) 11.6 - 15.2 seconds   INR 2.06 (*) 0.00 - 1.49  APTT     Status: Abnormal   Collection Time    05/01/12  9:10 PM      Result Value Range   aPTT 39 (*) 24 - 37 seconds   Comment:            IF BASELINE aPTT IS ELEVATED,     SUGGEST PATIENT RISK ASSESSMENT     BE USED TO DETERMINE APPROPRIATE     ANTICOAGULANT THERAPY.  COMPREHENSIVE METABOLIC PANEL     Status: Abnormal   Collection Time    05/01/12  9:10 PM      Result Value Range   Sodium 139  135 - 145 mEq/L   Potassium 4.0  3.5 - 5.1 mEq/L   Chloride 106  96 - 112 mEq/L   CO2 25  19 - 32 mEq/L   Glucose, Bld 102 (*) 70 - 99 mg/dL   BUN 16  6 - 23 mg/dL   Creatinine, Ser 1.30  0.50 - 1.35 mg/dL   Calcium 9.0  8.4 - 86.5 mg/dL   Total Protein 6.6  6.0 - 8.3 g/dL  Albumin 3.4 (*) 3.5 - 5.2 g/dL   AST 20  0 - 37 U/L   ALT 18  0 - 53 U/L   Alkaline Phosphatase 57  39 - 117 U/L   Total Bilirubin 0.4  0.3 - 1.2 mg/dL   GFR calc non Af Amer 68 (*) >90 mL/min   GFR calc Af Amer 78 (*) >90 mL/min   Comment:            The eGFR has been calculated     using the CKD EPI equation.     This calculation has not been     validated in all clinical     situations.     eGFR's persistently     <90 mL/min signify     possible Chronic Kidney  Disease.  GLUCOSE, CAPILLARY     Status: None   Collection Time    05/01/12  9:13 PM      Result Value Range   Glucose-Capillary 93  70 - 99 mg/dL  URINALYSIS, ROUTINE W REFLEX MICROSCOPIC     Status: None   Collection Time    05/01/12  9:15 PM      Result Value Range   Color, Urine YELLOW  YELLOW   APPearance CLEAR  CLEAR   Specific Gravity, Urine 1.017  1.005 - 1.030   pH 5.0  5.0 - 8.0   Glucose, UA NEGATIVE  NEGATIVE mg/dL   Hgb urine dipstick NEGATIVE  NEGATIVE   Bilirubin Urine NEGATIVE  NEGATIVE   Ketones, ur NEGATIVE  NEGATIVE mg/dL   Protein, ur NEGATIVE  NEGATIVE mg/dL   Urobilinogen, UA 0.2  0.0 - 1.0 mg/dL   Nitrite NEGATIVE  NEGATIVE   Leukocytes, UA NEGATIVE  NEGATIVE   Comment: MICROSCOPIC NOT DONE ON URINES WITH NEGATIVE PROTEIN, BLOOD, LEUKOCYTES, NITRITE, OR GLUCOSE <1000 mg/dL.  POCT I-STAT TROPONIN I     Status: None   Collection Time    05/01/12  9:21 PM      Result Value Range   Troponin i, poc 0.01  0.00 - 0.08 ng/mL   Comment 3            Comment: Due to the release kinetics of cTnI,     a negative result within the first hours     of the onset of symptoms does not rule out     myocardial infarction with certainty.     If myocardial infarction is still suspected,     repeat the test at appropriate intervals.  HEMOGLOBIN A1C     Status: Abnormal   Collection Time    05/02/12  6:40 AM      Result Value Range   Hemoglobin A1C 6.1 (*) <5.7 %   Comment: (NOTE)                                                                               According to the ADA Clinical Practice Recommendations for 2011, when     HbA1c is used as a screening test:      >=6.5%   Diagnostic of Diabetes Mellitus               (if abnormal  result is confirmed)     5.7-6.4%   Increased risk of developing Diabetes Mellitus     References:Diagnosis and Classification of Diabetes Mellitus,Diabetes     Care,2011,34(Suppl 1):S62-S69 and Standards of Medical Care in              Diabetes - 2011,Diabetes Care,2011,34 (Suppl 1):S11-S61.   Mean Plasma Glucose 128 (*) <117 mg/dL  LIPID PANEL     Status: Abnormal   Collection Time    05/02/12  6:40 AM      Result Value Range   Cholesterol 152  0 - 200 mg/dL   Triglycerides 85  <161 mg/dL   HDL 35 (*) >09 mg/dL   Total CHOL/HDL Ratio 4.3     VLDL 17  0 - 40 mg/dL   LDL Cholesterol 604 (*) 0 - 99 mg/dL   Comment:            Total Cholesterol/HDL:CHD Risk     Coronary Heart Disease Risk Table                         Men   Women      1/2 Average Risk   3.4   3.3      Average Risk       5.0   4.4      2 X Average Risk   9.6   7.1      3 X Average Risk  23.4   11.0                Use the calculated Patient Ratio     above and the CHD Risk Table     to determine the patient's CHD Risk.                ATP III CLASSIFICATION (LDL):      <100     mg/dL   Optimal      540-981  mg/dL   Near or Above                        Optimal      130-159  mg/dL   Borderline      191-478  mg/dL   High      >295     mg/dL   Very High  PROTIME-INR     Status: Abnormal   Collection Time    05/02/12  6:40 AM      Result Value Range   Prothrombin Time 22.3 (*) 11.6 - 15.2 seconds   INR 2.05 (*) 0.00 - 1.49   Ct Head Wo Contrast  05/01/2012  *RADIOLOGY REPORT*  Clinical Data: Right-sided weakness  CT HEAD WITHOUT CONTRAST  Technique:  Contiguous axial images were obtained from the base of the skull through the vertex without contrast.  Comparison: 09/24/2011  Findings: There is no evidence for acute hemorrhage, hydrocephalus, mass lesion, or abnormal extra-axial fluid collection.  No definite CT evidence for acute infarction.  The visualized paranasal sinuses and mastoid air cells are clear. Old small right cerebellar infarct is unchanged.  IMPRESSION: Stable.  No acute intracranial abnormality.   Original Report Authenticated By: Kennith Center, M.D.    [facility] Wo Contrast  05/02/2012  *RADIOLOGY REPORT*  Clinical Data:  Stroke,  right leg weakness  MRI HEAD WITHOUT CONTRAST MRA HEAD WITHOUT CONTRAST  Technique:  Multiplanar, multiecho pulse sequences of the brain and surrounding structures were obtained without  intravenous contrast. Angiographic images of the head were obtained using MRA technique without contrast.  Comparison:  CT 05/01/2012  MRI HEAD  Findings:  Small area acute infarct in the left parietal white matter.  No other acute infarct is identified.  Ventricle size is normal.  Small chronic infarct in the right cerebellum.  No other significant chronic ischemia.  Brainstem is normal.  Negative for intracranial hemorrhage.  Negative for mass or edema. No shift of the midline structures.  Mild mucosal thickening in the maxillary sinus bilaterally  IMPRESSION: Small area of acute infarction left parietal lobe.  MRA HEAD  Findings: Both vertebral arteries are patent to the basilar.  The basilar is widely patent.  PICA is patent bilaterally.  Superior cerebellar and posterior cerebral arteries are patent bilaterally with mild irregularity which could be due to atherosclerotic disease or motion.  Internal carotid artery is patent bilaterally without significant stenosis.  Anterior and middle cerebral arteries are patent bilaterally.  Mild branch irregularity is similar to the prior study and may be due to atherosclerotic disease.  Negative for cerebral aneurysm.  IMPRESSION: Mild intracranial vascular irregularity in branch vessels probably due to atherosclerotic disease.  No large vessel occlusion.   Original Report Authenticated By: Janeece Riggers, M.D.    Mri Brain Without Contrast  05/02/2012  *RADIOLOGY REPORT*  Clinical Data:  Stroke, right leg weakness  MRI HEAD WITHOUT CONTRAST MRA HEAD WITHOUT CONTRAST  Technique:  Multiplanar, multiecho pulse sequences of the brain and surrounding structures were obtained without intravenous contrast. Angiographic images of the head were obtained using MRA technique without contrast.   Comparison:  CT 05/01/2012  MRI HEAD  Findings:  Small area acute infarct in the left parietal white matter.  No other acute infarct is identified.  Ventricle size is normal.  Small chronic infarct in the right cerebellum.  No other significant chronic ischemia.  Brainstem is normal.  Negative for intracranial hemorrhage.  Negative for mass or edema. No shift of the midline structures.  Mild mucosal thickening in the maxillary sinus bilaterally  IMPRESSION: Small area of acute infarction left parietal lobe.  MRA HEAD  Findings: Both vertebral arteries are patent to the basilar.  The basilar is widely patent.  PICA is patent bilaterally.  Superior cerebellar and posterior cerebral arteries are patent bilaterally with mild irregularity which could be due to atherosclerotic disease or motion.  Internal carotid artery is patent bilaterally without significant stenosis.  Anterior and middle cerebral arteries are patent bilaterally.  Mild branch irregularity is similar to the prior study and may be due to atherosclerotic disease.  Negative for cerebral aneurysm.  IMPRESSION: Mild intracranial vascular irregularity in branch vessels probably due to atherosclerotic disease.  No large vessel occlusion.   Original Report Authenticated By: Janeece Riggers, M.D.        Assessment: 72 y.o. male with hypertension, atrial fibrillation on coumadin, prior stroke without residual deficits, now with an acute, small right parietal infarct causing right leg weakness but not sensory abnormalities. Most likely cardio-embolism in the context of atrial fibrillation. TTE pending but carotid ultrasound showed no hemodynamically significant carotid disease. Therapeutic on coumadin. Also taking aspirin which in conjunction with coumadin could increase the risk of bleeding. Doing better now. Will ask the stroke team to see patient in the morning and make further recommendations about anticoagulation.  Wyatt Portela , MD [facility] [phone]  05/02/2012, 3:26 PM

## 2012-05-02 NOTE — H&P (Signed)
[facility] History and Physical  Ross Lewis NWG:[id number] DOB: May 06, 1940    PCP:   Chrystie Nose, MD   Chief Complaint: right leg weakness.  HPI: Ross Lewis is an 72 y.o. male with hx of afib on coumadin, prior CVA on ASA, HTN, chronic back pain, presents to the ER with right leg "dragging" earlier today.  He had trouble walking as it was weak.  He did not have any pain down his leg.  Denied slurred speech, facial numbness, upper extremity symptoms.   He did not have any bowel or urinary symptoms.  In the ER, he had improvement of his symptoms.  Evaluation in the ER showed CT head without any acute changes.  His serology was unremarkable as well.  Hospitalist was asked to admit him for stroke work up.    Rewiew of Systems:  Constitutional: Negative for malaise, fever and chills. No significant weight loss or weight gain Eyes: Negative for eye pain, redness and discharge, diplopia, visual changes, or flashes of light. ENMT: Negative for ear pain, hoarseness, nasal congestion, sinus pressure and sore throat. No headaches; tinnitus, drooling, or problem swallowing. Cardiovascular: Negative for chest pain, palpitations, diaphoresis, dyspnea and peripheral edema. ; No orthopnea, PND Respiratory: Negative for cough, hemoptysis, wheezing and stridor. No pleuritic chestpain. Gastrointestinal: Negative for nausea, vomiting, diarrhea, constipation, abdominal pain, melena, blood in stool, hematemesis, jaundice and rectal bleeding.    Genitourinary: Negative for frequency, dysuria, incontinence,flank pain and hematuria; Musculoskeletal: Negative for back pain and neck pain. Negative for swelling and trauma.;  Skin: . Negative for pruritus, rash, abrasions, bruising and skin lesion.; ulcerations Neuro: Negative for headache, lightheadedness and neck stiffness. Negative for weakness, altered level of consciousness , altered mental status,  burning feet, involuntary movement, seizure  and syncope.  Psych: negative for anxiety, depression, insomnia, tearfulness, panic attacks, hallucinations, paranoia, suicidal or homicidal ideation    Past Medical History  Diagnosis Date  . TIA (transient ischemic attack)   . Atrial fibrillation   . Hypertension   . Stroke   . Nephrolithiasis   . Chronic back pain     Past Surgical History  Procedure Laterality Date  . Cardiac catheterization    . Cardioversion    . Tonsillectomy      Medications:  HOME MEDS: Prior to Admission medications   Medication Sig Start Date End Date Taking? Authorizing Provider  aspirin EC 81 MG tablet Take 81 mg by mouth every other day.    Yes Historical Provider, MD  diltiazem (CARDIZEM CD) 180 MG 24 hr capsule Take 180 mg by mouth daily.   Yes Historical Provider, MD  metoprolol succinate (TOPROL-XL) 50 MG 24 hr tablet Take 50 mg by mouth daily. Take with or immediately following a meal.   Yes Historical Provider, MD  warfarin (COUMADIN) 5 MG tablet Take 5 mg by mouth daily.   Yes Historical Provider, MD     Allergies:  No Known Allergies  Social History:   reports that he has never smoked. He does not have any smokeless tobacco history on file. He reports that he does not drink alcohol or use illicit drugs.  Family History: No family history on file.   Physical Exam: Filed Vitals:   05/01/12 2000 05/01/12 2030 05/01/12 2100 05/01/12 2200  BP: 153/61 143/47 158/76 141/51  Pulse: 120 101 107 63  Temp:      TempSrc:      Resp: 19 18 19 15   SpO2: 99% 99% 98% 98%  Blood pressure 141/51, pulse 63, temperature 99 F (37.2 C), temperature source Oral, resp. rate 15, SpO2 98.00%.  GEN:  Pleasant  patient lying in the stretcher in no acute distress; cooperative with exam. PSYCH:  alert and oriented x4; does not appear anxious or depressed; affect is appropriate. HEENT: Mucous membranes pink and anicteric; PERRLA; EOM intact; no cervical lymphadenopathy nor thyromegaly or carotid  bruit; no JVD; There were no stridor. Neck is very supple. Breasts:: Not examined CHEST WALL: No tenderness CHEST: Normal respiration, clear to auscultation bilaterally.  HEART: Regular rate and rhythm.  There are no murmur, rub, or gallops.   BACK: No kyphosis or scoliosis; no CVA tenderness ABDOMEN: soft and non-tender; no masses, no organomegaly, normal abdominal bowel sounds; no pannus; no intertriginous candida. There is no rebound and no distention. Rectal Exam: Not done EXTREMITIES: No bone or joint deformity; age-appropriate arthropathy of the hands and knees; no edema; no ulcerations.  There is no calf tenderness. Genitalia: not examined PULSES: 2+ and symmetric SKIN: Normal hydration no rash or ulceration CNS: Cranial nerves 2-12 grossly intact no focal lateralizing neurologic deficit.  Speech is fluent; uvula elevated with phonation, facial symmetry and tongue midline. DTR are normal bilaterally, cerebella exam is intact, barbinski is negative and strengths are equaled bilaterally.  No sensory loss.   Labs on Admission:  Basic Metabolic Panel:  Recent Labs Lab 05/01/12 2110  NA 139  K 4.0  CL 106  CO2 25  GLUCOSE 102*  BUN 16  CREATININE 1.06  CALCIUM 9.0   Liver Function Tests:  Recent Labs Lab 05/01/12 2110  AST 20  ALT 18  ALKPHOS 57  BILITOT 0.4  PROT 6.6  ALBUMIN 3.4*   No results found for this basename: LIPASE, AMYLASE,  in the last 168 hours No results found for this basename: AMMONIA,  in the last 168 hours CBC:  Recent Labs Lab 05/01/12 2110  WBC 7.2  NEUTROABS 4.0  HGB 14.5  HCT 43.4  MCV 97.3  PLT 168   Cardiac Enzymes: No results found for this basename: CKTOTAL, CKMB, CKMBINDEX, TROPONINI,  in the last 168 hours  CBG:  Recent Labs Lab 05/01/12 2113  GLUCAP 93     Radiological Exams on Admission: Ct Head Wo Contrast  05/01/2012  *RADIOLOGY REPORT*  Clinical Data: Right-sided weakness  CT HEAD WITHOUT CONTRAST  Technique:   Contiguous axial images were obtained from the base of the skull through the vertex without contrast.  Comparison: 09/24/2011  Findings: There is no evidence for acute hemorrhage, hydrocephalus, mass lesion, or abnormal extra-axial fluid collection.  No definite CT evidence for acute infarction.  The visualized paranasal sinuses and mastoid air cells are clear. Old small right cerebellar infarct is unchanged.  IMPRESSION: Stable.  No acute intracranial abnormality.   Original Report Authenticated By: Kennith Center, M.D.     EKG: Independently reviewed. NSR no acute ST-T changes.   Assessment/Plan Present on Admission:  . TIA (transient ischemic attack) . Chronic a-fib . Lumbago without sciatica . HTN (hypertension)  PLAN:  Will admit him for TIA work up.  He had lower extremity weakness but no other neurological deficit.  His right leg is now very strong.  No sensory loss.  He is also on ASA and Coumadin, and I will continue them.  Will obtain MRI of the brain along with cardiac ECHO and carotid sonogram.  He is very stable, full code, and will be admitted to telemetry under [facility] service.  Thank  you for allowing me to participate in the care of your nice patient.  Other plans as per orders.  Code Status: FULL Unk Lightning, MD. [facility] Pager [phone] 7pm to 7am.  05/02/2012, 1:08 AM

## 2012-05-02 NOTE — Plan of Care (Signed)
Problem: Phase I Progression Outcomes Goal: Pt admitted to Stroke Unit Outcome: Not Met (add Reason) Admitted to observation status on Unit 2000

## 2012-05-02 NOTE — Progress Notes (Signed)
  Echocardiogram 2D Echocardiogram has been performed.  CHUNG, KWONG 05/02/2012, 2:09 PM

## 2012-05-02 NOTE — Progress Notes (Signed)
*  PRELIMINARY RESULTS* Vascular Ultrasound Carotid Duplex (Doppler) has been completed.  Preliminary findings: Bilateral:  No evidence of hemodynamically significant internal carotid artery stenosis.   Vertebral artery flow is antegrade.      Farrel Demark, RDMS, RVT  05/02/2012, 2:00 PM

## 2012-05-03 DIAGNOSIS — R29898 Other symptoms and signs involving the musculoskeletal system: Secondary | ICD-10-CM | POA: Diagnosis not present

## 2012-05-03 DIAGNOSIS — G459 Transient cerebral ischemic attack, unspecified: Secondary | ICD-10-CM

## 2012-05-03 LAB — CBC
MCH: 32.1 pg (ref 26.0–34.0)
MCHC: 34.1 g/dL (ref 30.0–36.0)
Platelets: 165 10*3/uL (ref 150–400)
RBC: 4.77 MIL/uL (ref 4.22–5.81)
RDW: 13.2 % (ref 11.5–15.5)

## 2012-05-03 LAB — GLUCOSE, CAPILLARY
Glucose-Capillary: 120 mg/dL — ABNORMAL HIGH (ref 70–99)
Glucose-Capillary: 117 mg/dL — ABNORMAL HIGH (ref 70–99)

## 2012-05-03 LAB — BASIC METABOLIC PANEL
Calcium: 9 mg/dL (ref 8.4–10.5)
GFR calc Af Amer: 73 mL/min — ABNORMAL LOW (ref >90)
GFR calc non Af Amer: 63 mL/min — ABNORMAL LOW (ref >90)
Sodium: 141 mEq/L (ref 135–145)

## 2012-05-03 MED ORDER — RIVAROXABAN 15 MG PO TABS: 15.0000 mg | ORAL_TABLET | Freq: Two times a day (BID) | ORAL | Status: DC

## 2012-05-03 MED ORDER — RIVAROXABAN 20 MG PO TABS: 20.0000 mg | ORAL_TABLET | Freq: Every day | ORAL | Status: DC

## 2012-05-03 MED ORDER — ATORVASTATIN CALCIUM 10 MG PO TABS: 10.0000 mg | ORAL_TABLET | Freq: Every day | ORAL | Status: DC

## 2012-05-03 MED ORDER — METOPROLOL SUCCINATE ER 25 MG PO TB24: 25.0000 mg | ORAL_TABLET | Freq: Every day | ORAL | Status: DC

## 2012-05-03 NOTE — Evaluation (Signed)
Occupational Therapy Evaluation Patient Details Name: Ross Lewis MRN: [id number] DOB: 1940-08-02 Today's Date: 05/03/2012 Time: 4098-1191 OT Time Calculation (min): 15 min  OT Assessment / Plan / Recommendation Clinical Impression  Pt admitted with RLE weakness.  MRI showed small acute infarct in left parietal lobe.  Pt performing BADLs at baseline level. Education completed and pt has no further acute OT needs.  will sign off.    OT Assessment  Patient does not need any further OT services    Follow Up Recommendations  No OT follow up;Supervision - Intermittent    Barriers to Discharge      Equipment Recommendations  None recommended by OT    Recommendations for Other Services    Frequency       Precautions / Restrictions Precautions Precautions: Fall   Pertinent Vitals/Pain HR in 70s during session.    ADL  Eating/Feeding: Performed;Independent Where Assessed - Eating/Feeding: Edge of bed Grooming: Performed;Wash/dry hands Where Assessed - Grooming: Unsupported standing Lower Body Dressing: Performed;Independent Where Assessed - Lower Body Dressing: Unsupported sit to stand Toilet Transfer: Simulated;Modified independent Toilet Transfer Method: Sit to Barista:  (bed, walked to sink and returned to bed) Transfers/Ambulation Related to ADLs: mod I without device. slightly increased time. ADL Comments: Pt reports he is at baseline with BADLs.  Although no LOB during session, pt reports he feels a little unsteady when up on his feet. Educated pt and wife on use of shower chair in tub to help minimize fall risk and conserve energy.   Pt verbalized undertanding and reports he may look into purchasing one in community.    OT Diagnosis:    OT Problem List:   OT Treatment Interventions:     OT Goals    Visit Information  Last OT Received On: 05/03/12 Assistance Needed: +1    Subjective Data      Prior Functioning     Home Living Lives  With: Spouse Available Help at Discharge: Family;Available PRN/intermittently Type of Home: House Home Access: Stairs to enter [employer] of Steps: 3 Entrance Stairs-Rails: None Home Layout: One level Bathroom Shower/Tub: Tub/shower unit;Door Dentist: None Prior Function Level of Independence: Independent Able to Take Stairs?: Yes Driving: Yes Vocation: Full time employment Comments: self employed in farming supplies Communication Communication: No difficulties         Vision/Perception Vision - History Baseline Vision: Wears glasses all the time Patient Visual Report: No change from baseline Vision - Assessment Additional Comments: Pt reports at baseline has been experiencing decreased vision over time and no longer drives at night.  No report of change other than baseline deficit.   Cognition  Cognition Overall Cognitive Status: Appears within functional limits for tasks assessed/performed Arousal/Alertness: Awake/alert Orientation Level: Oriented X4 / Intact Behavior During Session: [facility] for tasks performed    Extremity/Trunk Assessment Right Upper Extremity Assessment RUE ROM/Strength/Tone: Within functional levels Left Upper Extremity Assessment LUE ROM/Strength/Tone: Within functional levels     Mobility Bed Mobility Bed Mobility: Supine to Sit;Sitting - Scoot to Edge of Bed;Sit to Supine Supine to Sit: 7: Independent;HOB flat Sitting - Scoot to Edge of Bed: 7: Independent Sit to Supine: 7: Independent;HOB flat Transfers Transfers: Sit to Stand;Stand to Sit Sit to Stand: 7: Independent;From bed;With upper extremity assist Stand to Sit: 7: Independent;To bed;With upper extremity assist     Exercise     Balance     End of Session OT - End of Session  Activity Tolerance: Patient tolerated treatment well Patient left: in bed;with family/visitor present;with call bell/phone within reach  GO Functional  Assessment Tool Used: clinical judgement Functional Limitation: Self care Self Care Current Status (Z6109): 0 percent impaired, limited or restricted Self Care Goal Status (U0454): 0 percent impaired, limited or restricted Self Care Discharge Status [phone]): 0 percent impaired, limited or restricted  05/03/2012 Cipriano Mile OTR/L Pager [phone] Office [phone]  Cipriano Mile 05/03/2012, 9:46 AM

## 2012-05-03 NOTE — Progress Notes (Signed)
CMT text paged that pt was in v-tach, went into room, pt was standing at sink brushing teeth. Pt asyomptomatic and wife is at bedside. Will continue to monitor.

## 2012-05-03 NOTE — Progress Notes (Addendum)
Pt is to be discharged home today. Pt is in NAD, IV is out, all paperwork has been reviewed/discussed with patient, and there are no questions/concerns at this time. Assessment is unchanged from this morning. Pt is to be accompanied downstairs by staff and family, Pt refused wheelchair and elected to ambulate downstairs with family.

## 2012-05-03 NOTE — Progress Notes (Signed)
**Note Ross-Identified via Obfuscation** Stroke Team Progress Note  HISTORY Ross Lewis is an 72 y.o. male with a past medical history significant for hypertension, atrial fibrillation on coumadin, stroke without residual deficits, brought to the ED with new onset right leg weakness. Stated that he first notice the weakness of his right leg yesterday 05/01/2012 evening, but his wife said that around 6:30 am he was already " kind of dragging" the right leg when they were in the kitchen making coffee. Ross Lewis tells me that he last evening he was sitting in a couch reading a book and when he got up he was not able to use his right leg as he usually does, it was heavy and weak. Denies numbness -tingling involving the right side. No weakness of the right arm or face. No associated headache, vertigo, double vision, difficulty swallowing, language or vision impairment. INR therapeutic at 2.06 Brought to [facility] where he had CT brain that shoed no acute intracranial abnormality, but a subsequent MRI-DWI revealed a small infarction right parietal region.  Carotid ultrasound unremarkable. TTE done, results pending. Total cholesterol 152, triglycerides 85, HDL 35, LDL 100. Ross Lewis stated that the right leg is getting stronger and his walking improving.   Patient was not a TPA candidate secondary to delay in arrival. He was admitted for further evaluation and treatment.  SUBJECTIVE His wife is at the bedside.  Overall he feels his condition is gradually improving.   OBJECTIVE Most recent Vital Signs: Filed Vitals:   05/02/12 1329 05/02/12 1610 05/02/12 2014 05/03/12 0436  BP: 111/55 111/79 122/54 119/74  Pulse: 53  98 59  Temp: 98.8 F (37.1 C)  98.7 F (37.1 C) 97.7 F (36.5 C)  TempSrc: Oral  Oral Oral  Resp: 18  18 18   Height:      Weight:      SpO2: 98%  97% 99%   CBG (last 3)   Recent Labs  05/01/12 2113 05/02/12 2313 05/03/12 0549  GLUCAP 93 120* 117*    IV Fluid Intake:     MEDICATIONS  . aspirin EC  81 mg Oral  QODAY  . atorvastatin  10 mg Oral q1800  . diltiazem  120 mg Oral Daily  . metoprolol succinate  25 mg Oral Daily  . warfarin  5 mg Oral q1800  . Warfarin - Pharmacist Dosing Inpatient   Does not apply q1800   PRN:    Diet:  Cardiac thin liquids Activity:  Bathroom privileges with assistance DVT Prophylaxis:  warfarin  CLINICALLY SIGNIFICANT STUDIES Basic Metabolic Panel:  Recent Labs Lab 05/01/12 2110 05/03/12 0640  NA 139 141  K 4.0 4.0  CL 106 111  CO2 25 23  GLUCOSE 102* 130*  BUN 16 17  CREATININE 1.06 1.13  CALCIUM 9.0 9.0   Liver Function Tests:  Recent Labs Lab 05/01/12 2110  AST 20  ALT 18  ALKPHOS 57  BILITOT 0.4  PROT 6.6  ALBUMIN 3.4*   CBC:  Recent Labs Lab 05/01/12 2110 05/03/12 0640  WBC 7.2 6.7  NEUTROABS 4.0  --   HGB 14.5 15.3  HCT 43.4 44.9  MCV 97.3 94.1  PLT 168 165   Coagulation:  Recent Labs Lab 05/01/12 2110 05/02/12 0640  LABPROT 22.4* 22.3*  INR 2.06* 2.05*   Cardiac Enzymes: No results found for this basename: CKTOTAL, CKMB, CKMBINDEX, TROPONINI,  in the last 168 hours Urinalysis:  Recent Labs Lab 05/01/12 2115  COLORURINE YELLOW  LABSPEC 1.017  PHURINE 5.0  GLUCOSEU  NEGATIVE  HGBUR NEGATIVE  BILIRUBINUR NEGATIVE  KETONESUR NEGATIVE  PROTEINUR NEGATIVE  UROBILINOGEN 0.2  NITRITE NEGATIVE  LEUKOCYTESUR NEGATIVE   Lipid Panel    Component Value Date/Time   CHOL 152 05/02/2012 0640   TRIG 85 05/02/2012 0640   HDL 35* 05/02/2012 0640   CHOLHDL 4.3 05/02/2012 0640   VLDL 17 05/02/2012 0640   LDLCALC 100* 05/02/2012 0640   HgbA1C  Lab Results  Component Value Date   HGBA1C 6.1* 05/02/2012    Urine Drug Screen:   No results found for this basename: labopia, cocainscrnur, labbenz, amphetmu, thcu, labbarb    Alcohol Level:  Recent Labs Lab 05/01/12 2110  ETH <11    CT of the brain  05/01/2012   Stable.  No acute intracranial abnormality.    MRI of the brain  05/02/2012 Small area of acute infarction left parietal  lobe.   MRA of the brain  05/02/2012  Mild intracranial vascular irregularity in branch vessels probably due to atherosclerotic disease.  No large vessel occlusion.  2D Echocardiogram  05/02/2012 EF 50-65% with no source of embolus seen. Patient with atrial fibrillation   Carotid Doppler  05/02/2012 No evidence of hemodynamically significant internal carotid artery stenosis. Vertebral artery flow is antegrade.   CXR    EKG  normal sinus rhythm.   Therapy Recommendations OP PT  Physical Exam   Pleasant middle aged Caucasian male Awake alert. Afebrile. Head is nontraumatic. Neck is supple without bruit. Hearing is normal. Cardiac exam no murmur or gallop. Lungs are clear to auscultation. Distal pulses are well felt. Neurological Exam :Awake  Alert oriented x 3. Normal speech and language.eye movements full without nystagmus.Fundi not visualized. Vision acuity and fields appear normal. Face symmetric. Tongue midline. Normal strength, tone, reflexes and coordination. Normal sensation. Gait deferred. ASSESSMENT Ross Lewis is a 71 y.o. male presenting with new onset right leg weakness. Imaging confirms a left parietal infarct. Infarct felt to be embolic secondary to known atrial fibrillation. On warfarin prior to admission. INR therapeutic at 2.06 on arrival. Now on warfarin for secondary stroke prevention. Patient with resultant tingling and heaviness in right leg as well as mild ataxia. Work up completed.  atrial fibrillation Hx TIA Hx stroke Hypertension Hyperlipidemia, LDL 100, not on statin PTA, on atorvastatin now, goal LDL < 100  Hospital day # 2  TREATMENT/PLAN  Stop aspirin. Given stroke on therapeutic warfarin, change warfarin to xarelto for secondary stroke prevention. Hold coumadin today. Plan starting xarelto in am. Or as per pharmacy.  OP PT Patient has a 10-15% risk of having another stroke over the next year, the highest risk is within 2 weeks of the most recent  stroke/TIA (risk of having a stroke following a stroke or TIA is the same). Ongoing risk factor control by Primary Care Physician Stroke Service will sign off. Please call should any needs arise. Follow up with Dr. Pearlean Brownie, Stroke Clinic, in 2 months.   Annie Main, MSN, RN, ANVP-BC, ANP-BC, [facility] Pager: [phone] 05/03/2012 8:21 AM  I have personally obtained a history, examined the patient, evaluated imaging results, and formulated the assessment and plan of care. I agree with the above. Delia Heady, MD

## 2012-05-03 NOTE — Discharge Summary (Addendum)
Physician Discharge Summary  Ross Lewis MRN: [id number] DOB/AGE: [birth year] 72 y.o.  PCP: Chrystie Nose, MD   Admit date: 05/01/2012 Discharge date: 05/03/2012  Discharge Diagnoses:    CVA Active Problems:   Chronic a-fib   Lumbago without sciatica   HTN (hypertension)      Medication List    STOP taking these medications       aspirin EC 81 MG tablet     warfarin 5 MG tablet  Commonly known as:  COUMADIN      TAKE these medications       atorvastatin 10 MG tablet  Commonly known as:  LIPITOR  Take 1 tablet (10 mg total) by mouth daily at 6 PM.     diltiazem 180 MG 24 hr capsule  Commonly known as:  CARDIZEM CD  Take 180 mg by mouth daily.     metoprolol succinate 25MG  24 hr tablet  Commonly known as:  TOPROL-XL  Take 25mg  by mouth daily. Take with or immediately following a meal.                 Rivaroxaban 20 MG Tabs  Commonly known as:  XARELTO  Take 1 tablet (20 mg total) by mouth daily.  Start taking on:  05/04/2012        Discharge Condition: Stable   Disposition:  home   Consults:  Neurology   Significant Diagnostic Studies: Ct Head Wo Contrast  05/01/2012  *RADIOLOGY REPORT*  Clinical Data: Right-sided weakness  CT HEAD WITHOUT CONTRAST  Technique:  Contiguous axial images were obtained from the base of the skull through the vertex without contrast.  Comparison: 09/24/2011  Findings: There is no evidence for acute hemorrhage, hydrocephalus, mass lesion, or abnormal extra-axial fluid collection.  No definite CT evidence for acute infarction.  The visualized paranasal sinuses and mastoid air cells are clear. Old small right cerebellar infarct is unchanged.  IMPRESSION: Stable.  No acute intracranial abnormality.   Original Report Authenticated By: Kennith Center, M.D.    [facility] Wo Contrast  05/02/2012  *RADIOLOGY REPORT*  Clinical Data:  Stroke, right leg weakness  MRI HEAD WITHOUT CONTRAST MRA HEAD WITHOUT CONTRAST  Technique:   Multiplanar, multiecho pulse sequences of the brain and surrounding structures were obtained without intravenous contrast. Angiographic images of the head were obtained using MRA technique without contrast.  Comparison:  CT 05/01/2012  MRI HEAD  Findings:  Small area acute infarct in the left parietal white matter.  No other acute infarct is identified.  Ventricle size is normal.  Small chronic infarct in the right cerebellum.  No other significant chronic ischemia.  Brainstem is normal.  Negative for intracranial hemorrhage.  Negative for mass or edema. No shift of the midline structures.  Mild mucosal thickening in the maxillary sinus bilaterally  IMPRESSION: Small area of acute infarction left parietal lobe.  MRA HEAD  Findings: Both vertebral arteries are patent to the basilar.  The basilar is widely patent.  PICA is patent bilaterally.  Superior cerebellar and posterior cerebral arteries are patent bilaterally with mild irregularity which could be due to atherosclerotic disease or motion.  Internal carotid artery is patent bilaterally without significant stenosis.  Anterior and middle cerebral arteries are patent bilaterally.  Mild branch irregularity is similar to the prior study and may be due to atherosclerotic disease.  Negative for cerebral aneurysm.  IMPRESSION: Mild intracranial vascular irregularity in branch vessels probably due to atherosclerotic disease.  No large vessel  occlusion.   Original Report Authenticated By: Janeece Riggers, M.D.    Mri Brain Without Contrast  05/02/2012  *RADIOLOGY REPORT*  Clinical Data:  Stroke, right leg weakness  MRI HEAD WITHOUT CONTRAST MRA HEAD WITHOUT CONTRAST  Technique:  Multiplanar, multiecho pulse sequences of the brain and surrounding structures were obtained without intravenous contrast. Angiographic images of the head were obtained using MRA technique without contrast.  Comparison:  CT 05/01/2012  MRI HEAD  Findings:  Small area acute infarct in the left  parietal white matter.  No other acute infarct is identified.  Ventricle size is normal.  Small chronic infarct in the right cerebellum.  No other significant chronic ischemia.  Brainstem is normal.  Negative for intracranial hemorrhage.  Negative for mass or edema. No shift of the midline structures.  Mild mucosal thickening in the maxillary sinus bilaterally  IMPRESSION: Small area of acute infarction left parietal lobe.  MRA HEAD  Findings: Both vertebral arteries are patent to the basilar.  The basilar is widely patent.  PICA is patent bilaterally.  Superior cerebellar and posterior cerebral arteries are patent bilaterally with mild irregularity which could be due to atherosclerotic disease or motion.  Internal carotid artery is patent bilaterally without significant stenosis.  Anterior and middle cerebral arteries are patent bilaterally.  Mild branch irregularity is similar to the prior study and may be due to atherosclerotic disease.  Negative for cerebral aneurysm.  IMPRESSION: Mild intracranial vascular irregularity in branch vessels probably due to atherosclerotic disease.  No large vessel occlusion.   Original Report Authenticated By: Janeece Riggers, M.D.     2-D echo LV EF: 50% - 65%  ------------------------------------------------------------ Indications: TIA 435.9.  ------------------------------------------------------------ History: PMH: Atrial fibrillation. Risk factors: Lumbago. Hypertension.  ------------------------------------------------------------ Study Conclusions  - Left ventricle: The cavity size was normal. Wall thickness was normal. Systolic function was normal. The estimated ejection fraction was in the range of 50% to 65% with beat to beat variation. Although no diagnostic regional wall motion abnormality was identified, this possibility cannot be completely excluded on the basis of this study. - Ventricular septum: Septal motion showed mild dyssynergy. - Left  atrium: The atrium was moderately to severely dilated. - Right ventricle: The cavity size was mildly dilated. Wall thickness was normal. - Right atrium: The atrium was moderately dilated. - Atrial septum: The septum was thickened. No defect or patent foramen ovale was identified. Impressions:  - An atrial fibrillation rhythm was noted on this study, making this study technically difficult for the assessment of cardiac function.    Microbiology: No results found for this or any previous visit (from the past 240 hour(s)).   Labs: Results for orders placed during the hospital encounter of 05/01/12 (from the past 48 hour(s))  CBC WITH DIFFERENTIAL     Status: None   Collection Time    05/01/12  9:10 PM      Result Value Range   WBC 7.2  4.0 - 10.5 K/uL   RBC 4.46  4.22 - 5.81 MIL/uL   Hemoglobin 14.5  13.0 - 17.0 g/dL   HCT 16.1  09.6 - 04.5 %   MCV 97.3  78.0 - 100.0 fL   MCH 32.5  26.0 - 34.0 pg   MCHC 33.4  30.0 - 36.0 g/dL   RDW 40.9  81.1 - 91.4 %   Platelets 168  150 - 400 K/uL   Neutrophils Relative 56  43 - 77 %   Neutro Abs 4.0  1.7 - 7.7  K/uL   Lymphocytes Relative 31  12 - 46 %   Lymphs Abs 2.3  0.7 - 4.0 K/uL   Monocytes Relative 8  3 - 12 %   Monocytes Absolute 0.6  0.1 - 1.0 K/uL   Eosinophils Relative 4  0 - 5 %   Eosinophils Absolute 0.3  0.0 - 0.7 K/uL   Basophils Relative 1  0 - 1 %   Basophils Absolute 0.0  0.0 - 0.1 K/uL  ETHANOL     Status: None   Collection Time    05/01/12  9:10 PM      Result Value Range   Alcohol, Ethyl (B) <11  0 - 11 mg/dL   Comment:            LOWEST DETECTABLE LIMIT FOR     SERUM ALCOHOL IS 11 mg/dL     FOR MEDICAL PURPOSES ONLY  PROTIME-INR     Status: Abnormal   Collection Time    05/01/12  9:10 PM      Result Value Range   Prothrombin Time 22.4 (*) 11.6 - 15.2 seconds   INR 2.06 (*) 0.00 - 1.49  APTT     Status: Abnormal   Collection Time    05/01/12  9:10 PM      Result Value Range   aPTT 39 (*) 24 - 37  seconds   Comment:            IF BASELINE aPTT IS ELEVATED,     SUGGEST PATIENT RISK ASSESSMENT     BE USED TO DETERMINE APPROPRIATE     ANTICOAGULANT THERAPY.  COMPREHENSIVE METABOLIC PANEL     Status: Abnormal   Collection Time    05/01/12  9:10 PM      Result Value Range   Sodium 139  135 - 145 mEq/L   Potassium 4.0  3.5 - 5.1 mEq/L   Chloride 106  96 - 112 mEq/L   CO2 25  19 - 32 mEq/L   Glucose, Bld 102 (*) 70 - 99 mg/dL   BUN 16  6 - 23 mg/dL   Creatinine, Ser 1.61  0.50 - 1.35 mg/dL   Calcium 9.0  8.4 - 09.6 mg/dL   Total Protein 6.6  6.0 - 8.3 g/dL   Albumin 3.4 (*) 3.5 - 5.2 g/dL   AST 20  0 - 37 U/L   ALT 18  0 - 53 U/L   Alkaline Phosphatase 57  39 - 117 U/L   Total Bilirubin 0.4  0.3 - 1.2 mg/dL   GFR calc non Af Amer 68 (*) >90 mL/min   GFR calc Af Amer 78 (*) >90 mL/min   Comment:            The eGFR has been calculated     using the CKD EPI equation.     This calculation has not been     validated in all clinical     situations.     eGFR's persistently     <90 mL/min signify     possible Chronic Kidney Disease.  GLUCOSE, CAPILLARY     Status: None   Collection Time    05/01/12  9:13 PM      Result Value Range   Glucose-Capillary 93  70 - 99 mg/dL  URINALYSIS, ROUTINE W REFLEX MICROSCOPIC     Status: None   Collection Time    05/01/12  9:15 PM      Result Value Range  Color, Urine YELLOW  YELLOW   APPearance CLEAR  CLEAR   Specific Gravity, Urine 1.017  1.005 - 1.030   pH 5.0  5.0 - 8.0   Glucose, UA NEGATIVE  NEGATIVE mg/dL   Hgb urine dipstick NEGATIVE  NEGATIVE   Bilirubin Urine NEGATIVE  NEGATIVE   Ketones, ur NEGATIVE  NEGATIVE mg/dL   Protein, ur NEGATIVE  NEGATIVE mg/dL   Urobilinogen, UA 0.2  0.0 - 1.0 mg/dL   Nitrite NEGATIVE  NEGATIVE   Leukocytes, UA NEGATIVE  NEGATIVE   Comment: MICROSCOPIC NOT DONE ON URINES WITH NEGATIVE PROTEIN, BLOOD, LEUKOCYTES, NITRITE, OR GLUCOSE <1000 mg/dL.  POCT I-STAT TROPONIN I     Status: None    Collection Time    05/01/12  9:21 PM      Result Value Range   Troponin i, poc 0.01  0.00 - 0.08 ng/mL   Comment 3            Comment: Due to the release kinetics of cTnI,     a negative result within the first hours     of the onset of symptoms does not rule out     myocardial infarction with certainty.     If myocardial infarction is still suspected,     repeat the test at appropriate intervals.  HEMOGLOBIN A1C     Status: Abnormal   Collection Time    05/02/12  6:40 AM      Result Value Range   Hemoglobin A1C 6.1 (*) <5.7 %   Comment: (NOTE)                                                                               According to the ADA Clinical Practice Recommendations for 2011, when     HbA1c is used as a screening test:      >=6.5%   Diagnostic of Diabetes Mellitus               (if abnormal result is confirmed)     5.7-6.4%   Increased risk of developing Diabetes Mellitus     References:Diagnosis and Classification of Diabetes Mellitus,Diabetes     Care,2011,34(Suppl 1):S62-S69 and Standards of Medical Care in             Diabetes - 2011,Diabetes Care,2011,34 (Suppl 1):S11-S61.   Mean Plasma Glucose 128 (*) <117 mg/dL  LIPID PANEL     Status: Abnormal   Collection Time    05/02/12  6:40 AM      Result Value Range   Cholesterol 152  0 - 200 mg/dL   Triglycerides 85  <161 mg/dL   HDL 35 (*) >09 mg/dL   Total CHOL/HDL Ratio 4.3     VLDL 17  0 - 40 mg/dL   LDL Cholesterol 604 (*) 0 - 99 mg/dL   Comment:            Total Cholesterol/HDL:CHD Risk     Coronary Heart Disease Risk Table                         Men   Women      1/2 Average Risk  3.4   3.3      Average Risk       5.0   4.4      2 X Average Risk   9.6   7.1      3 X Average Risk  23.4   11.0                Use the calculated Patient Ratio     above and the CHD Risk Table     to determine the patient's CHD Risk.                ATP III CLASSIFICATION (LDL):      <100     mg/dL   Optimal      244-010   mg/dL   Near or Above                        Optimal      130-159  mg/dL   Borderline      272-536  mg/dL   High      >644     mg/dL   Very High  PROTIME-INR     Status: Abnormal   Collection Time    05/02/12  6:40 AM      Result Value Range   Prothrombin Time 22.3 (*) 11.6 - 15.2 seconds   INR 2.05 (*) 0.00 - 1.49  GLUCOSE, CAPILLARY     Status: Abnormal   Collection Time    05/02/12 11:13 PM      Result Value Range   Glucose-Capillary 120 (*) 70 - 99 mg/dL  GLUCOSE, CAPILLARY     Status: Abnormal   Collection Time    05/03/12  5:49 AM      Result Value Range   Glucose-Capillary 117 (*) 70 - 99 mg/dL  CBC     Status: None   Collection Time    05/03/12  6:40 AM      Result Value Range   WBC 6.7  4.0 - 10.5 K/uL   RBC 4.77  4.22 - 5.81 MIL/uL   Hemoglobin 15.3  13.0 - 17.0 g/dL   HCT 03.4  74.2 - 59.5 %   MCV 94.1  78.0 - 100.0 fL   MCH 32.1  26.0 - 34.0 pg   MCHC 34.1  30.0 - 36.0 g/dL   RDW 63.8  75.6 - 43.3 %   Platelets 165  150 - 400 K/uL  BASIC METABOLIC PANEL     Status: Abnormal   Collection Time    05/03/12  6:40 AM      Result Value Range   Sodium 141  135 - 145 mEq/L   Potassium 4.0  3.5 - 5.1 mEq/L   Chloride 111  96 - 112 mEq/L   CO2 23  19 - 32 mEq/L   Glucose, Bld 130 (*) 70 - 99 mg/dL   BUN 17  6 - 23 mg/dL   Creatinine, Ser 2.95  0.50 - 1.35 mg/dL   Calcium 9.0  8.4 - 18.8 mg/dL   GFR calc non Af Amer 63 (*) >90 mL/min   GFR calc Af Amer 73 (*) >90 mL/min   Comment:            The eGFR has been calculated     using the CKD EPI equation.     This calculation has not been     validated in all clinical     situations.  eGFR's persistently     <90 mL/min signify     possible Chronic Kidney Disease.     HPI : 71 y.o. male with a past medical history significant for hypertension, atrial fibrillation on coumadin, stroke without residual deficits, brought to the ED with new onset right leg weakness. Stated that he first notice the weakness of  his right leg yesterday 05/01/2012 evening, but his wife said that around 6:30 am he was already " kind of dragging" the right leg when they were in the kitchen making coffee. Ross. Lewis tells me that he last evening he was sitting in a couch reading a book and when he got up he was not able to use his right leg as he usually does, it was heavy and weak. Denies numbness -tingling involving the right side. No weakness of the right arm or face. No associated headache, vertigo, double vision, difficulty swallowing, language or vision impairment. INR therapeutic at 2.06 Brought to [facility] where he had CT brain that shoed no acute intracranial abnormality, but a subsequent MRI-DWI revealed a small infarction right parietal region.  Carotid ultrasound unremarkable. TTE done, results pending. Total cholesterol 152, triglycerides 85, HDL 35, LDL 100. Ross Lewis stated that the right leg is getting stronger and his walking improving.  Patient was not a TPA candidate secondary to delay in arrival. He was admitted for further evaluation and treatment   HOSPITAL COURSE:  #1 acute CVA Workup included CT of the brain 05/01/2012 Stable. No acute intracranial abnormality.  MRI of the brain 05/02/2012 Small area of acute infarction left parietal lobe.  MRA of the brain 05/02/2012 Mild intracranial vascular irregularity in branch vessels probably due to atherosclerotic disease. No large vessel occlusion.  2D Echocardiogram 05/02/2012 EF 50-65% with no source of embolus seen. Patient with atrial fibrillation  Carotid Doppler 05/02/2012 No evidence of hemodynamically significant internal carotid artery stenosis. Vertebral artery flow is antegrade.  CXR  EKG normal sinus rhythm.    discussed with neurology, INR was 2.06 upon arrival, technically the patient has failed aspirin and Coumadin the recommendation is to discontinue aspirin and Coumadin and switch the patient to xarelto, starting tomorrow Followup with Dr. Pearlean Brownie, Stroke  Clinic, in 2 months    Discharge Exam:   Blood pressure 119/74, pulse 59, temperature 97.7 F (36.5 C), temperature source Oral, resp. rate 18, height 5\' 8"  (1.727 m), weight 90.13 kg (198 lb 11.2 oz), SpO2 99.00%.           Follow-up Information   Follow up with Your Cardiologist In 1 day.      Follow up with HILTY,Kenneth C, MD In 1 day.   Contact information:   [address] [place] [address] [phone]       Follow up with [facility] EMERGENCY DEPARTMENT. (If symptoms worsen)    Contact information:   [address] [id number] [place] [address] [phone]      Follow up with Gates Rigg, MD. Schedule an appointment as soon as possible for a visit in 2 months. (stroke clinic)    Contact information:   [address] [facility] [place] [address] [phone]       Signed: Richarda Overlie 05/03/2012, 11:02 AM

## 2012-05-12 DIAGNOSIS — G459 Transient cerebral ischemic attack, unspecified: Secondary | ICD-10-CM | POA: Diagnosis not present

## 2012-05-12 DIAGNOSIS — I4891 Unspecified atrial fibrillation: Secondary | ICD-10-CM | POA: Diagnosis not present

## 2012-05-12 DIAGNOSIS — E782 Mixed hyperlipidemia: Secondary | ICD-10-CM | POA: Diagnosis not present

## 2012-05-17 ENCOUNTER — Ambulatory Visit: Payer: Self-pay | Admitting: Internal Medicine

## 2012-05-17 DIAGNOSIS — Z7901 Long term (current) use of anticoagulants: Secondary | ICD-10-CM

## 2012-05-17 DIAGNOSIS — I482 Chronic atrial fibrillation, unspecified: Secondary | ICD-10-CM

## 2012-06-07 DIAGNOSIS — G459 Transient cerebral ischemic attack, unspecified: Secondary | ICD-10-CM | POA: Diagnosis not present

## 2012-06-07 DIAGNOSIS — I4891 Unspecified atrial fibrillation: Secondary | ICD-10-CM | POA: Diagnosis not present

## 2012-06-26 DIAGNOSIS — Z7901 Long term (current) use of anticoagulants: Secondary | ICD-10-CM | POA: Diagnosis not present

## 2012-06-26 DIAGNOSIS — I4891 Unspecified atrial fibrillation: Secondary | ICD-10-CM | POA: Diagnosis not present

## 2012-07-10 DIAGNOSIS — Z7901 Long term (current) use of anticoagulants: Secondary | ICD-10-CM | POA: Diagnosis not present

## 2012-07-10 DIAGNOSIS — I4891 Unspecified atrial fibrillation: Secondary | ICD-10-CM | POA: Diagnosis not present

## 2012-08-09 ENCOUNTER — Ambulatory Visit (INDEPENDENT_AMBULATORY_CARE_PROVIDER_SITE_OTHER): Payer: Medicare Other | Admitting: Pharmacist Clinician (PhC)/ Clinical Pharmacy Specialist

## 2012-08-09 VITALS — BP 130/62 | HR 68

## 2012-08-09 DIAGNOSIS — I482 Chronic atrial fibrillation, unspecified: Secondary | ICD-10-CM

## 2012-08-09 DIAGNOSIS — I4891 Unspecified atrial fibrillation: Secondary | ICD-10-CM | POA: Diagnosis not present

## 2012-08-09 DIAGNOSIS — Z7901 Long term (current) use of anticoagulants: Secondary | ICD-10-CM

## 2012-09-20 ENCOUNTER — Ambulatory Visit: Payer: Medicare Other | Admitting: Pharmacist Clinician (PhC)/ Clinical Pharmacy Specialist

## 2012-09-20 ENCOUNTER — Ambulatory Visit (INDEPENDENT_AMBULATORY_CARE_PROVIDER_SITE_OTHER): Payer: Medicare Other | Admitting: Neurology

## 2012-09-20 ENCOUNTER — Encounter: Payer: Self-pay | Admitting: Neurology

## 2012-09-20 VITALS — BP 126/64 | HR 54 | Ht 68.0 in | Wt 202.0 lb

## 2012-09-20 DIAGNOSIS — I6350 Cerebral infarction due to unspecified occlusion or stenosis of unspecified cerebral artery: Secondary | ICD-10-CM

## 2012-09-20 NOTE — Progress Notes (Signed)
[facility] [address] [place]. [place] [address] [phone]       OFFICE FOLLOW-UP NOTE  [name] Date of Birth:  1940-04-14 Medical Record Number:  [id number]   HPI: Ross Lewis is a 72 year old gentleman who was admitted on 05/01/12 with sudden onset of right leg weakness which he noticed the evening before but he yet went to bed. He woke up the next day and noticed right leg was dragging while walking into the kitchen to make coffee. He denied associated headache, vertigo, double vision. He had history of atrial fibrillation and was on warfarin and INR was therapeutic at 2.06  On admission. CT scan of the head showed no acute abnormality. MRI scan of the brain showed small acute infarct in the left parietal region. Carotid ultrasound was unremarkable. Transthoraxic echo showed normal ejection fraction. Total cholesterol was 152, LDL borderline at 100 mg percent. Patient was seen by physical occupational therapy. A1c was 6.1. Urine drug screen was negative. He was changed to Xarelto for stroke prevention and dischargd home and has done well with full recovery in his right leg strength. He however states he was unable to afford Xarelto due to cost and has switched back warfarin. His cardiologist Dr. Rennis Golden has also added a baby aspirin every other day. He seems to be tolerating this combination well without significant bleeding or bruising. He has also decreased his metoprolol dose to 25 mg. He states his blood pressure is under good control and is 126 /64 in office today.  ROS:   14 system review of systems is positive for easy bruising only  PMH:  Past Medical History  Diagnosis Date  . TIA (transient ischemic attack)   . Atrial fibrillation   . Hypertension   . Stroke   . Nephrolithiasis   . Chronic back pain     Social History:  History   Social History  . Marital Status: Married    Spouse Name: N/A    Number of Children: N/A  . Years of Education:  N/A   Occupational History  . Not on file.   Social History Main Topics  . Smoking status: Never Smoker   . Smokeless tobacco: Not on file  . Alcohol Use: No  . Drug Use: No  . Sexually Active: Not on file   Other Topics Concern  . Not on file   Social History Narrative  . No narrative on file    Medications:   Current Outpatient Prescriptions on File Prior to Visit  Medication Sig Dispense Refill  . diltiazem (CARDIZEM CD) 180 MG 24 hr capsule Take 180 mg by mouth daily.      . metoprolol succinate (TOPROL-XL) 25 MG 24 hr tablet Take 1 tablet (25 mg total) by mouth daily.  30 tablet  0   No current facility-administered medications on file prior to visit.    Allergies:  No Known Allergies Filed Vitals:   09/20/12 1528  BP: 126/64  Pulse: 54    Physical Exam General: well developed, well nourished, seated, in no evident distress Head: head normocephalic and atraumatic. Orohparynx benign Neck: supple with no carotid or supraclavicular bruits Cardiovascular: regular rate and rhythm, no murmurs Musculoskeletal: no deformity Skin:  no rash/petichiae Vascular:  Normal pulses all extremities  Neurologic Exam Mental Status: Awake and fully alert. Oriented to place and time. Recent and remote memory intact. Attention span, concentration and fund of knowledge appropriate. Mood and affect appropriate.  Cranial Nerves: Fundoscopic  exam reveals sharp disc margins. Pupils equal, briskly reactive to light. Extraocular movements full without nystagmus. Visual fields full to confrontation. Hearing intact. Facial sensation intact. Face, tongue, palate moves normally and symmetrically.  Motor: Normal bulk and tone. Normal strength in all tested extremity muscles. Sensory.: intact to tough and pinprick and vibratory.  Coordination: Rapid alternating movements normal in all extremities. Finger-to-nose and heel-to-shin performed accurately bilaterally. Gait and Station: Arises from chair  without difficulty. Stance is normal. Gait demonstrates normal stride length and balance . Able to heel, toe and tandem walk without difficulty.  Reflexes: 1+ and symmetric. Toes downgoing.     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 Patient was therapeutic on warfarin which was changed to xarelto  but patient cannot afford it and has switched back to warfarin again. Vascular risk factors of a perforation, hypertension, hyperlipidemia and cerebrovascular disease.    PLAN:  Continue warfarin for secondary prevention as patient is unable to afford them nor will you have anticoagulants. Discontinue aspirin as I see no added benefit in stroke prevention and increased risk for bleeding. Maintain strict control of hypertension with blood pressure goal below 130/90 tubes with LDL cholesterol goal below 100 mg percent. Return for followup in 4 months with exam, NP.

## 2012-09-20 NOTE — Patient Instructions (Addendum)
Continue warfarin for secondary prevention as patient is unable to afford them nor will you have anticoagulants. Discontinue aspirin as I see no added benefit in stroke prevention and increased risk for bleeding. Maintain strict control of hypertension with blood pressure goal below 130/90 tubes with LDL cholesterol goal below 100 mg percent. Return for followup in 4 months with exam, NP.

## 2012-09-21 ENCOUNTER — Ambulatory Visit (INDEPENDENT_AMBULATORY_CARE_PROVIDER_SITE_OTHER): Payer: Medicare Other | Admitting: Pharmacist Clinician (PhC)/ Clinical Pharmacy Specialist

## 2012-09-21 VITALS — BP 140/72 | HR 68

## 2012-09-21 DIAGNOSIS — Z7901 Long term (current) use of anticoagulants: Secondary | ICD-10-CM

## 2012-09-21 DIAGNOSIS — I4891 Unspecified atrial fibrillation: Secondary | ICD-10-CM

## 2012-09-21 DIAGNOSIS — I482 Chronic atrial fibrillation, unspecified: Secondary | ICD-10-CM

## 2012-09-21 MED ORDER — WARFARIN SODIUM 5 MG PO TABS: 5.0000 mg | ORAL_TABLET | Freq: Every day | ORAL | Status: DC

## 2012-11-01 DIAGNOSIS — H348392 Tributary (branch) retinal vein occlusion, unspecified eye, stable: Secondary | ICD-10-CM | POA: Diagnosis not present

## 2012-11-01 DIAGNOSIS — H2510 Age-related nuclear cataract, unspecified eye: Secondary | ICD-10-CM | POA: Diagnosis not present

## 2012-11-01 DIAGNOSIS — H43819 Vitreous degeneration, unspecified eye: Secondary | ICD-10-CM | POA: Diagnosis not present

## 2012-11-02 ENCOUNTER — Ambulatory Visit (INDEPENDENT_AMBULATORY_CARE_PROVIDER_SITE_OTHER): Payer: Medicare Other | Admitting: Pharmacist Clinician (PhC)/ Clinical Pharmacy Specialist

## 2012-11-02 VITALS — BP 120/66 | HR 56

## 2012-11-02 DIAGNOSIS — I4891 Unspecified atrial fibrillation: Secondary | ICD-10-CM | POA: Diagnosis not present

## 2012-11-02 DIAGNOSIS — Z7901 Long term (current) use of anticoagulants: Secondary | ICD-10-CM

## 2012-11-02 DIAGNOSIS — I482 Chronic atrial fibrillation, unspecified: Secondary | ICD-10-CM

## 2012-12-14 ENCOUNTER — Ambulatory Visit (INDEPENDENT_AMBULATORY_CARE_PROVIDER_SITE_OTHER): Payer: Medicare Other | Admitting: Pharmacist Clinician (PhC)/ Clinical Pharmacy Specialist

## 2012-12-14 VITALS — BP 120/82 | HR 64

## 2012-12-14 DIAGNOSIS — I4891 Unspecified atrial fibrillation: Secondary | ICD-10-CM | POA: Diagnosis not present

## 2012-12-14 DIAGNOSIS — I482 Chronic atrial fibrillation, unspecified: Secondary | ICD-10-CM

## 2012-12-14 DIAGNOSIS — Z7901 Long term (current) use of anticoagulants: Secondary | ICD-10-CM

## 2012-12-14 LAB — POCT INR: INR: 2.4

## 2013-01-04 DIAGNOSIS — H348392 Tributary (branch) retinal vein occlusion, unspecified eye, stable: Secondary | ICD-10-CM | POA: Diagnosis not present

## 2013-01-04 DIAGNOSIS — H3581 Retinal edema: Secondary | ICD-10-CM | POA: Diagnosis not present

## 2013-01-04 DIAGNOSIS — H2510 Age-related nuclear cataract, unspecified eye: Secondary | ICD-10-CM | POA: Diagnosis not present

## 2013-01-19 ENCOUNTER — Encounter: Payer: Self-pay | Admitting: Nurse Practitioner

## 2013-01-19 ENCOUNTER — Ambulatory Visit (INDEPENDENT_AMBULATORY_CARE_PROVIDER_SITE_OTHER): Payer: Medicare Other | Admitting: Nurse Practitioner

## 2013-01-19 VITALS — BP 129/81 | HR 64 | Temp 98.5°F | Ht 68.0 in | Wt 202.0 lb

## 2013-01-19 DIAGNOSIS — I6350 Cerebral infarction due to unspecified occlusion or stenosis of unspecified cerebral artery: Secondary | ICD-10-CM

## 2013-01-19 NOTE — Progress Notes (Signed)
PATIENT: Ross Lewis DOB: 1941-02-21   REASON FOR VISIT: follow up for stroke HISTORY FROM: patient  HISTORY OF PRESENT ILLNESS: Ross Lewis is a 72 year old gentleman who was admitted on 05/01/12 with sudden onset of right leg weakness which he noticed the evening before but he yet went to bed. He woke up the next day and noticed right leg was dragging while walking into the kitchen to make coffee. He denied associated headache, vertigo, double vision. He had history of atrial fibrillation and was on warfarin and INR was therapeutic at 2.06 On admission. CT scan of the head showed no acute abnormality. MRI scan of the brain showed small acute infarct in the left parietal region. Carotid ultrasound was unremarkable. Transthoracic echo showed normal ejection fraction. Total cholesterol was 152, LDL borderline at 100 mg percent. Patient was seen by physical occupational therapy. A1c was 6.1. Urine drug screen was negative. He was changed to Xarelto for stroke prevention and dischargd home and has done well with full recovery in his right leg strength. He however states he was unable to afford Xarelto due to cost and has switched back warfarin. His cardiologist Dr. Rennis Golden has also added a baby aspirin every other day. He seems to be tolerating this combination well without significant bleeding or bruising. He has also decreased his metoprolol dose to 25 mg. He states his blood pressure is under good control and is 126 /64 in office today.   UPDATE 01/19/13 (LL):  Ross Lewis comes to the office today for stroke follow up.  He states he has been well and has no residual deficits from the stroke.  He is tolerating Coumadin well, states his goal is between 2.5-3.  He denies significant bleeding or bruising. He states his blood pressure is under good control and is 129/81 in office today.   ROS:  14 system review of systems is positive for easy bruising only  ALLERGIES: No Known Allergies  HOME  MEDICATIONS: Outpatient Prescriptions Prior to Visit  Medication Sig Dispense Refill  . diltiazem (CARDIZEM CD) 180 MG 24 hr capsule Take 180 mg by mouth daily.      . metoprolol succinate (TOPROL-XL) 25 MG 24 hr tablet Take 1 tablet (25 mg total) by mouth daily.  30 tablet  0  . warfarin (COUMADIN) 5 MG tablet Take 1 tablet (5 mg total) by mouth daily.  90 tablet  2  . aspirin 81 MG tablet Take 81 mg by mouth daily.       No facility-administered medications prior to visit.    PAST MEDICAL HISTORY: Past Medical History  Diagnosis Date  . TIA (transient ischemic attack)   . Atrial fibrillation   . Hypertension   . Stroke   . Nephrolithiasis   . Chronic back pain     PAST SURGICAL HISTORY: Past Surgical History  Procedure Laterality Date  . Cardiac catheterization    . Cardioversion    . Tonsillectomy      FAMILY HISTORY: No family history on file.  SOCIAL HISTORY: History   Social History  . Marital Status: Married    Spouse Name: N/A    Number of Children: N/A  . Years of Education: N/A   Occupational History  . Not on file.   Social History Main Topics  . Smoking status: Never Smoker   . Smokeless tobacco: Not on file  . Alcohol Use: No  . Drug Use: No  . Sexual Activity: Not on file  Other Topics Concern  . Not on file   Social History Narrative  . No narrative on file     PHYSICAL EXAM  Filed Vitals:   01/19/13 1340  BP: 129/81  Pulse: 64  Temp: 98.5 F (36.9 C)  TempSrc: Oral  Height: 5\' 8"  (1.727 m)  Weight: 202 lb (91.627 kg)   Body mass index is 30.72 kg/(m^2).  Physical Exam  General: well developed, well nourished, seated, in no evident distress  Head: head normocephalic and atraumatic. Orohparynx benign  Neck: supple with no carotid or supraclavicular bruits  Cardiovascular: regular rate and rhythm, no murmurs  Musculoskeletal: no deformity  Skin: no rash/petichiae  Vascular: Normal pulses all extremities   Neurologic Exam   Mental Status: Awake and fully alert. Oriented to place and time. Recent and remote memory intact. Attention span, concentration and fund of knowledge appropriate. Mood and affect appropriate.  Cranial Nerves: Pupils equal, briskly reactive to light. Extraocular movements full without nystagmus. Visual fields full to confrontation. Hearing intact. Facial sensation intact. Face, tongue, palate moves normally and symmetrically.  Motor: Normal bulk and tone. Normal strength in all tested extremity muscles.  Sensory: intact to touch and pinprick and vibratory.  Coordination: Rapid alternating movements normal in all extremities. Finger-to-nose and heel-to-shin performed accurately bilaterally.  Gait and Station: Arises from chair without difficulty. Stance is normal. Gait demonstrates normal stride length and balance . Able to heel, toe and tandem walk without difficulty.  Reflexes: 1+ and symmetric. Toes downgoing.   DIAGNOSTIC DATA (LABS, IMAGING, TESTING) - I reviewed patient records, labs, notes, testing and imaging myself where available.  Lab Results  Component Value Date   WBC 6.7 05/03/2012   HGB 15.3 05/03/2012   HCT 44.9 05/03/2012   MCV 94.1 05/03/2012   PLT 165 05/03/2012      Component Value Date/Time   NA 141 05/03/2012 0640   K 4.0 05/03/2012 0640   CL 111 05/03/2012 0640   CO2 23 05/03/2012 0640   GLUCOSE 130* 05/03/2012 0640   BUN 17 05/03/2012 0640   CREATININE 1.13 05/03/2012 0640   CALCIUM 9.0 05/03/2012 0640   PROT 6.6 05/01/2012 2110   ALBUMIN 3.4* 05/01/2012 2110   AST 20 05/01/2012 2110   ALT 18 05/01/2012 2110   ALKPHOS 57 05/01/2012 2110   BILITOT 0.4 05/01/2012 2110   GFRNONAA 63* 05/03/2012 0640   GFRAA 73* 05/03/2012 0640   Lab Results  Component Value Date   CHOL 152 05/02/2012   HDL 35* 05/02/2012   LDLCALC 100* 05/02/2012   TRIG 85 05/02/2012   CHOLHDL 4.3 05/02/2012   Lab Results  Component Value Date   HGBA1C 6.1* 05/02/2012    ASSESSMENT AND PLAN 72 year old patient with left parietal  infarct in March 2014 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 Patient was therapeutic on warfarin which was changed to xarelto but patient cannot afford it and has switched back to warfarin again. Vascular risk factors of a perforation, hypertension, hyperlipidemia and cerebrovascular disease.   PLAN:  Continue warfarin for secondary prevention and maintain strict control of hypertension with blood pressure goal below 130/90  with LDL cholesterol goal below 100 mg percent.  Return for followup in 6 months with NP.  Ronal Fear, MSN, NP-C 01/19/2013, 2:28 PM [facility] [address] [place], [place] [address] [phone]  Note: This document was prepared with digital dictation and possible smart phrase technology. Any transcriptional errors that  result from this process are unintentional.

## 2013-01-19 NOTE — Patient Instructions (Signed)
PLAN:  Continue warfarin for secondary prevention and maintain strict control of hypertension with blood pressure goal below 130/90  with LDL cholesterol goal below 100 mg percent.  Return for followup in 6 months with NP.

## 2013-01-24 ENCOUNTER — Ambulatory Visit (INDEPENDENT_AMBULATORY_CARE_PROVIDER_SITE_OTHER): Payer: Medicare Other | Admitting: Pharmacist Clinician (PhC)/ Clinical Pharmacy Specialist

## 2013-01-24 VITALS — BP 120/70 | HR 54

## 2013-01-24 DIAGNOSIS — Z7901 Long term (current) use of anticoagulants: Secondary | ICD-10-CM | POA: Diagnosis not present

## 2013-01-24 DIAGNOSIS — I4891 Unspecified atrial fibrillation: Secondary | ICD-10-CM | POA: Diagnosis not present

## 2013-01-24 DIAGNOSIS — I482 Chronic atrial fibrillation, unspecified: Secondary | ICD-10-CM

## 2013-03-05 ENCOUNTER — Ambulatory Visit (INDEPENDENT_AMBULATORY_CARE_PROVIDER_SITE_OTHER): Payer: Medicare Other | Admitting: Pharmacist Clinician (PhC)/ Clinical Pharmacy Specialist

## 2013-03-05 VITALS — BP 144/68 | HR 64

## 2013-03-05 DIAGNOSIS — Z7901 Long term (current) use of anticoagulants: Secondary | ICD-10-CM

## 2013-03-05 DIAGNOSIS — I4891 Unspecified atrial fibrillation: Secondary | ICD-10-CM | POA: Diagnosis not present

## 2013-03-05 DIAGNOSIS — Z5181 Encounter for therapeutic drug level monitoring: Secondary | ICD-10-CM | POA: Diagnosis not present

## 2013-03-05 DIAGNOSIS — I482 Chronic atrial fibrillation, unspecified: Secondary | ICD-10-CM

## 2013-03-05 LAB — POCT INR: INR: 2.7

## 2013-04-16 ENCOUNTER — Telehealth: Payer: Self-pay | Admitting: Pharmacist Clinician (PhC)/ Clinical Pharmacy Specialist

## 2013-04-16 ENCOUNTER — Ambulatory Visit (INDEPENDENT_AMBULATORY_CARE_PROVIDER_SITE_OTHER): Payer: Medicare Other | Admitting: Pharmacist Clinician (PhC)/ Clinical Pharmacy Specialist

## 2013-04-16 VITALS — BP 138/70 | HR 60

## 2013-04-16 DIAGNOSIS — I482 Chronic atrial fibrillation, unspecified: Secondary | ICD-10-CM

## 2013-04-16 DIAGNOSIS — Z7901 Long term (current) use of anticoagulants: Secondary | ICD-10-CM

## 2013-04-16 DIAGNOSIS — I4891 Unspecified atrial fibrillation: Secondary | ICD-10-CM | POA: Diagnosis not present

## 2013-04-16 LAB — POCT INR: INR: 2.5

## 2013-04-16 MED ORDER — DILTIAZEM HCL ER COATED BEADS 180 MG PO CP24: 180.0000 mg | ORAL_CAPSULE | Freq: Every day | ORAL | Status: DC

## 2013-04-16 NOTE — Telephone Encounter (Signed)
Spoke with dental assistant ant Dr. Mikey CollegeBoyte's office, pt needs to have root canal on 2 teeth, with potential to remove if problems arise.  Want to know about holding warfarin.  Dr. Hoyle SauerBoyte will defer to cardiology recommendation.  Reviewed with Dr. Rennis GoldenHilty.  Pt has hx of TIA while on therapeutic warfarin approx 1 year ago.  Will recommend to hold warfarin for 2 days prior to dental work, to restart that night and resume at normal dose.    Letter sent to DDS

## 2013-05-20 ENCOUNTER — Encounter: Payer: Self-pay | Admitting: *Deleted

## 2013-05-22 ENCOUNTER — Encounter: Payer: Self-pay | Admitting: Internal Medicine

## 2013-05-24 ENCOUNTER — Ambulatory Visit (INDEPENDENT_AMBULATORY_CARE_PROVIDER_SITE_OTHER): Payer: Medicare Other | Admitting: *Deleted

## 2013-05-24 ENCOUNTER — Encounter: Payer: Self-pay | Admitting: Internal Medicine

## 2013-05-24 ENCOUNTER — Telehealth: Payer: Self-pay | Admitting: Pharmacist Clinician (PhC)/ Clinical Pharmacy Specialist

## 2013-05-24 ENCOUNTER — Ambulatory Visit (INDEPENDENT_AMBULATORY_CARE_PROVIDER_SITE_OTHER): Payer: Medicare Other | Admitting: Internal Medicine

## 2013-05-24 VITALS — BP 118/68 | HR 49 | Ht 68.0 in | Wt 204.7 lb

## 2013-05-24 DIAGNOSIS — I4891 Unspecified atrial fibrillation: Secondary | ICD-10-CM

## 2013-05-24 DIAGNOSIS — I482 Chronic atrial fibrillation, unspecified: Secondary | ICD-10-CM

## 2013-05-24 DIAGNOSIS — Z7901 Long term (current) use of anticoagulants: Secondary | ICD-10-CM

## 2013-05-24 DIAGNOSIS — G459 Transient cerebral ischemic attack, unspecified: Secondary | ICD-10-CM | POA: Diagnosis not present

## 2013-05-24 DIAGNOSIS — Z79899 Other long term (current) drug therapy: Secondary | ICD-10-CM

## 2013-05-24 DIAGNOSIS — E785 Hyperlipidemia, unspecified: Secondary | ICD-10-CM

## 2013-05-24 DIAGNOSIS — I10 Essential (primary) hypertension: Secondary | ICD-10-CM | POA: Diagnosis not present

## 2013-05-24 LAB — POCT INR: INR: 2.6

## 2013-05-24 MED ORDER — DILTIAZEM HCL ER COATED BEADS 120 MG PO CP24: 120.0000 mg | ORAL_CAPSULE | Freq: Every day | ORAL | Status: DC

## 2013-05-24 MED ORDER — METOPROLOL SUCCINATE ER 25 MG PO TB24: 25.0000 mg | ORAL_TABLET | Freq: Every day | ORAL | Status: DC

## 2013-05-24 MED ORDER — WARFARIN SODIUM 5 MG PO TABS: ORAL_TABLET | ORAL | Status: DC

## 2013-05-24 NOTE — Patient Instructions (Signed)
Your physician recommends that you return for lab work at your earliest convenience.   Your physician has recommended you make the following change in your medication: decrease Cardizem to 120mg  once daily.   Your physician wants you to follow-up in: 1 year. You will receive a reminder letter in the mail two months in advance. If you don't receive a letter, please call our office to schedule the follow-up appointment.

## 2013-05-24 NOTE — Progress Notes (Signed)
OFFICE NOTE  Chief Complaint:  Routine follow-up  Primary Care Physician: Pcp Not In System  HPI:  Ross Lewis  is a 73 year old gentleman, who has a history of paroxysmal atrial fibrillation and a prior right cerebellar infarct. He has been on chronic Coumadin therapy. Recently, he has been in sinus rhythm. He had had headaches recently and there was concern about a possible stroke. He then presented to the hospital with an episode of foot drag on the right foot and some heaviness and was found to have a new TIA. This resolved fairly quickly without any further treatments; however, he was seen by neuro stroke service, Dr. Pearlean Brownie and recommended that he switch from Coumadin to Xarelto and was subsequently discharged. Since going home, he has felt fairly well; however, has had some problems with bradycardia. His Toprol XL was decreased to 25 mg daily during the hospitalization. He was maintained on Cardizem and as mentioned has switched to Xarelto. His main concern today is the cost of the Xarelto, which is about $322 a month since he does not have any supplemental drug coverage. But, otherwise, he has had no chest pain symptoms or worsening new stroke symptoms, shortness of breath, palpitations, presyncope, or syncopal symptoms.   He has since been switched back to warfarin and has done fairly well. He occasionally gets some shortness of breath and lightheadedness and dizziness with change in position.  PMHx:  Past Medical History  Diagnosis Date  . TIA (transient ischemic attack)   . Atrial fibrillation   . Hypertension   . Stroke     right cerebellar infarct  . Nephrolithiasis   . Chronic back pain     Past Surgical History  Procedure Laterality Date  . Cardiac catheterization    . Cardioversion  11/01/2002    normal L main, normal LAD, L Cfx normal & nondominant; RCA normal & dominant (Dr. Erlene Quan)  . Tonsillectomy    . Transthoracic echocardiogram  04/2012    EF 50-65%, LA  mod-severely dilated; RV mildly dilated; RA mod dilated    FAMHx:  History reviewed. No pertinent family history.  SOCHx:   reports that he has never smoked. He has never used smokeless tobacco. He reports that he does not drink alcohol or use illicit drugs.  ALLERGIES:  No Known Allergies  ROS: A comprehensive review of systems was negative except for: Respiratory: positive for dyspnea on exertion  HOME MEDS: Current Outpatient Prescriptions  Medication Sig Dispense Refill  . metoprolol succinate (TOPROL-XL) 25 MG 24 hr tablet Take 1 tablet (25 mg total) by mouth daily.  90 tablet  3  . warfarin (COUMADIN) 5 MG tablet Take as directed per INR.  100 tablet  1  . diltiazem (CARDIZEM CD) 120 MG 24 hr capsule Take 1 capsule (120 mg total) by mouth daily.  90 capsule  3   No current facility-administered medications for this visit.    LABS/IMAGING: No results found for this or any previous visit (from the past 48 hour(s)). No results found.  VITALS: BP 118/68  Pulse 49  Ht 5\' 8"  (1.727 m)  Wt 204 lb 11.2 oz (92.851 kg)  BMI 31.13 kg/m2  EXAM: General appearance: alert and no distress Neck: no carotid bruit and no JVD Lungs: clear to auscultation bilaterally Heart: regular rate and rhythm, S1, S2 normal, no murmur, click, rub or gallop Abdomen: soft, non-tender; bowel sounds normal; no masses,  no organomegaly Extremities: extremities normal, atraumatic, no cyanosis  or edema Pulses: 2+ and symmetric Skin: Skin color, texture, turgor normal. No rashes or lesions Neurologic: Grossly normal PSych: Mood, affect normal  EKG: Atrial fibrillation with slow ventricular response at 49  ASSESSMENT: 1. PAF on warfarin 2. History of stroke and TIA 3. Hypertension 4. Bradycardic ventricular response  PLAN: 1.   [name] is doing fairly well and has managed to be fairly therapeutic on warfarin. Unfortunately could not afford Xarelto or other novel oral anticoagulants. We'll  plan to continue warfarin. Of note his heart rate is slow somewhat today. I would recommend decreasing his diltiazem CD to 120 mg daily. This should allow slightly higher heart rate. Plan to see him back annually or sooner as necessary.  Chrystie NoseKenneth C. Ariyah Sedlack, MD, [facility] Hospital[facility]ACC Attending Cardiologist [facility]  [name] 06/05/2013, 10:09 PM

## 2013-05-24 NOTE — Telephone Encounter (Signed)
Updated INR order

## 2013-05-25 LAB — LIPID PANEL
CHOL/HDL RATIO: 4.5 ratio
CHOLESTEROL: 171 mg/dL (ref 0–200)
HDL: 38 mg/dL — ABNORMAL LOW (ref >39)
LDL CALC: 114 mg/dL — AB (ref 0–99)
TRIGLYCERIDES: 97 mg/dL (ref <150)
VLDL: 19 mg/dL (ref 0–40)

## 2013-06-05 ENCOUNTER — Encounter: Payer: Self-pay | Admitting: Internal Medicine

## 2013-07-09 ENCOUNTER — Ambulatory Visit (INDEPENDENT_AMBULATORY_CARE_PROVIDER_SITE_OTHER): Payer: Medicare Other | Admitting: Pharmacist Clinician (PhC)/ Clinical Pharmacy Specialist

## 2013-07-09 DIAGNOSIS — Z79899 Other long term (current) drug therapy: Secondary | ICD-10-CM | POA: Diagnosis not present

## 2013-07-09 DIAGNOSIS — Z7901 Long term (current) use of anticoagulants: Secondary | ICD-10-CM

## 2013-07-09 DIAGNOSIS — I4891 Unspecified atrial fibrillation: Secondary | ICD-10-CM

## 2013-07-09 DIAGNOSIS — I482 Chronic atrial fibrillation, unspecified: Secondary | ICD-10-CM

## 2013-07-09 LAB — POCT INR: INR: 2.7

## 2013-07-20 ENCOUNTER — Ambulatory Visit: Payer: Medicare Other | Admitting: Nurse Practitioner

## 2013-07-25 ENCOUNTER — Encounter: Payer: Self-pay | Admitting: Nurse Practitioner

## 2013-07-25 ENCOUNTER — Ambulatory Visit (INDEPENDENT_AMBULATORY_CARE_PROVIDER_SITE_OTHER): Payer: Medicare Other | Admitting: Nurse Practitioner

## 2013-07-25 VITALS — BP 119/69 | HR 70 | Wt 200.0 lb

## 2013-07-25 DIAGNOSIS — I6350 Cerebral infarction due to unspecified occlusion or stenosis of unspecified cerebral artery: Secondary | ICD-10-CM

## 2013-07-25 NOTE — Patient Instructions (Addendum)
Continue warfarin for secondary prevention and maintain strict control of hypertension with blood pressure goal below 130/90 with LDL cholesterol goal below 100 mg percent.  Return for followup in 6 months with Dr. Pearlean Brownie.

## 2013-07-25 NOTE — Progress Notes (Addendum)
PATIENT: Ross Lewis DOB: Nov 11, 1940  REASON FOR VISIT: follow up for stroke HISTORY FROM: patient  HISTORY OF PRESENT ILLNESS: [name] is a 73 year old gentleman who was admitted on 05/01/12 with sudden onset of right leg weakness which he noticed the evening before but he yet went to bed. He woke up the next day and noticed right leg was dragging while walking into the kitchen to make coffee. He denied associated headache, vertigo, double vision. He had history of atrial fibrillation and was on warfarin and INR was therapeutic at 2.06 On admission. CT scan of the head showed no acute abnormality. MRI scan of the brain showed small acute infarct in the left parietal region. Carotid ultrasound was unremarkable. Transthoracic echo showed normal ejection fraction. Total cholesterol was 152, LDL borderline at 100 mg percent. Patient was seen by physical occupational therapy. A1c was 6.1. Urine drug screen was negative. He was changed to Xarelto for stroke prevention and dischargd home and has done well with full recovery in his right leg strength. He however states he was unable to afford Xarelto due to cost and has switched back warfarin. His cardiologist Dr. Rennis GoldenHilty has also added a baby aspirin every other day. He seems to be tolerating this combination well without significant bleeding or bruising. He has also decreased his metoprolol dose to 25 mg. He states his blood pressure is under good control and is 126 /64 in office today.   UPDATE 01/19/13 (LL): [name] comes to the office today for stroke follow up. He states he has been well and has no residual deficits from the stroke. He is tolerating Coumadin well, states his goal is between 2.5-3. He denies significant bleeding or bruising. He states his blood pressure is under good control and is 129/81 in office today.   UPDATE 07/25/13 (LL): Since last visit, patient has been doing well. No residual deficits from stroke. His Coumadin level  stays in the 2.6-2.9 range, he denies significant bleeding or bruising.  His blood pressure is under good control, it is 199/69 in the office today.  He states his HR was 46 and 56 and two different times this past weekend, but he was not having any problems associated with it. Last lipid panel showed total cholesterol 171 and LDL 114.  No new complaints.  ROS:  14 system review of systems is positive for back pain, dizziness, activity change, fatigue only  ALLERGIES: No Known Allergies  HOME MEDICATIONS: Outpatient Prescriptions Prior to Visit  Medication Sig Dispense Refill  . diltiazem (CARDIZEM CD) 120 MG 24 hr capsule Take 1 capsule (120 mg total) by mouth daily.  90 capsule  3  . metoprolol succinate (TOPROL-XL) 25 MG 24 hr tablet Take 1 tablet (25 mg total) by mouth daily.  90 tablet  3  . warfarin (COUMADIN) 5 MG tablet Take as directed per INR.  100 tablet  1   No facility-administered medications prior to visit.    PHYSICAL EXAM  Filed Vitals:   07/25/13 1342  BP: 119/69  Pulse: 70  Weight: 200 lb (90.719 kg)   Body mass index is 30.42 kg/(m^2). No exam data present   Physical Exam  General: well developed, well nourished, seated, in no evident distress  Head: head normocephalic and atraumatic. Orohparynx benign  Neck: supple with no carotid or supraclavicular bruits  Cardiovascular: regular rate and rhythm, no murmurs  Musculoskeletal: no deformity  Skin: no rash/petichiae  Vascular: Normal pulses all extremities  Neurologic Exam  Mental Status: Awake and fully alert. Oriented to place and time. Recent and remote memory intact. Attention span, concentration and fund of knowledge appropriate. Mood and affect appropriate.  Cranial Nerves: Pupils equal, briskly reactive to light. Extraocular movements full without nystagmus. Visual fields full to confrontation. Hearing intact. Facial sensation intact. Face, tongue, palate moves normally and symmetrically.  Motor:  Normal bulk and tone. Normal strength in all tested extremity muscles.  Sensory: intact to touch and pinprick and vibratory.  Coordination: Rapid alternating movements normal in all extremities. Finger-to-nose and heel-to-shin performed accurately bilaterally.  Gait and Station: Arises from chair without difficulty. Stance is normal. Gait demonstrates normal stride length and balance . Able to heel, toe and tandem walk without difficulty.  Reflexes: 1+ and symmetric.  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 Patient was therapeutic on warfarin which was changed to xarelto but patient cannot afford it and has switched back to warfarin again. Vascular risk factors of hypertension, hyperlipidemia and cerebrovascular disease.   PLAN:  Continue warfarin for secondary prevention and maintain strict control of hypertension with blood pressure goal below 130/90 with LDL cholesterol goal below 100 mg percent.  Return for followup in 6 months with Dr. Ma Hillock Rovena Hearld, MSN, NP-C 07/25/2013, 1:45 PM [facility] [address], [address] [place], [place] [address] [phone]  Note: This document was prepared with digital dictation and possible smart phrase technology. Any transcriptional errors that result from this process are unintentional.  I have reviewed this note, pertinent data and agree with the plan Delia Heady, MD

## 2013-08-13 NOTE — Progress Notes (Signed)
I agree with above 

## 2013-08-20 ENCOUNTER — Ambulatory Visit (INDEPENDENT_AMBULATORY_CARE_PROVIDER_SITE_OTHER): Payer: Medicare Other | Admitting: Pharmacist Clinician (PhC)/ Clinical Pharmacy Specialist

## 2013-08-20 DIAGNOSIS — I4891 Unspecified atrial fibrillation: Secondary | ICD-10-CM | POA: Diagnosis not present

## 2013-08-20 DIAGNOSIS — Z7901 Long term (current) use of anticoagulants: Secondary | ICD-10-CM

## 2013-08-20 DIAGNOSIS — Z79899 Other long term (current) drug therapy: Secondary | ICD-10-CM | POA: Diagnosis not present

## 2013-08-20 DIAGNOSIS — I482 Chronic atrial fibrillation, unspecified: Secondary | ICD-10-CM

## 2013-08-20 LAB — POCT INR: INR: 3.1

## 2013-10-01 ENCOUNTER — Ambulatory Visit (INDEPENDENT_AMBULATORY_CARE_PROVIDER_SITE_OTHER): Payer: Medicare Other | Admitting: Pharmacist Clinician (PhC)/ Clinical Pharmacy Specialist

## 2013-10-01 VITALS — BP 132/72 | HR 60

## 2013-10-01 DIAGNOSIS — I482 Chronic atrial fibrillation, unspecified: Secondary | ICD-10-CM

## 2013-10-01 DIAGNOSIS — Z79899 Other long term (current) drug therapy: Secondary | ICD-10-CM | POA: Diagnosis not present

## 2013-10-01 DIAGNOSIS — Z7901 Long term (current) use of anticoagulants: Secondary | ICD-10-CM

## 2013-10-01 DIAGNOSIS — I4891 Unspecified atrial fibrillation: Secondary | ICD-10-CM | POA: Diagnosis not present

## 2013-10-01 LAB — POCT INR: INR: 3

## 2013-11-12 ENCOUNTER — Ambulatory Visit (INDEPENDENT_AMBULATORY_CARE_PROVIDER_SITE_OTHER): Payer: Medicare Other | Admitting: Pharmacist Clinician (PhC)/ Clinical Pharmacy Specialist

## 2013-11-12 DIAGNOSIS — Z7901 Long term (current) use of anticoagulants: Secondary | ICD-10-CM

## 2013-11-12 DIAGNOSIS — Z79899 Other long term (current) drug therapy: Secondary | ICD-10-CM

## 2013-11-12 DIAGNOSIS — I482 Chronic atrial fibrillation, unspecified: Secondary | ICD-10-CM

## 2013-11-12 DIAGNOSIS — I4891 Unspecified atrial fibrillation: Secondary | ICD-10-CM

## 2013-11-12 LAB — POCT INR: INR: 3

## 2013-11-12 MED ORDER — WARFARIN SODIUM 5 MG PO TABS: ORAL_TABLET | ORAL | Status: DC

## 2013-11-23 ENCOUNTER — Telehealth: Payer: Self-pay | Admitting: Neurology

## 2013-11-23 ENCOUNTER — Telehealth: Payer: Self-pay | Admitting: Internal Medicine

## 2013-11-23 NOTE — Telephone Encounter (Signed)
Patient's wife is calling.  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 Wife does not know what to do because she has talked to her husband's sons but that has not helped. She says it is affecting patient's health. Please call and advise. If no answer please leave telephone# and she will call back.

## 2013-11-23 NOTE — Telephone Encounter (Signed)
Please see note below. 

## 2013-11-23 NOTE — Telephone Encounter (Signed)
Wife wanted you to know that pt is working in environment where his family are smoking. She wanted it notated that she is really concerns and pt does not want her to say anything to his son and grandson.She does not know what to do,he have had 3 strokes and is blind in one eye.

## 2013-11-23 NOTE — Telephone Encounter (Signed)
Returned call to patient's wife no answer.LMTC. 

## 2013-11-24 ENCOUNTER — Other Ambulatory Visit: Payer: Self-pay | Admitting: Internal Medicine

## 2013-12-05 NOTE — Telephone Encounter (Signed)
Attempted to contact patient's wife. VM was available, but not name-verified. Message noted and will be sent to Dr. Rennis GoldenHilty as Lorain ChildesFYI of wife's concerns regarding patient being around smoking

## 2013-12-13 ENCOUNTER — Telehealth: Payer: Self-pay | Admitting: Neurology

## 2013-12-13 ENCOUNTER — Encounter: Payer: Self-pay | Admitting: Neurology

## 2013-12-13 NOTE — Telephone Encounter (Signed)
Left message for patient regarding moving appointment time on 04/17/14 per Dr. Sethi's schedule, printed and mailed letter with new appointment time.  °

## 2013-12-24 ENCOUNTER — Ambulatory Visit (INDEPENDENT_AMBULATORY_CARE_PROVIDER_SITE_OTHER): Payer: Medicare Other | Admitting: Pharmacist Clinician (PhC)/ Clinical Pharmacy Specialist

## 2013-12-24 DIAGNOSIS — I4891 Unspecified atrial fibrillation: Secondary | ICD-10-CM

## 2013-12-24 DIAGNOSIS — I482 Chronic atrial fibrillation, unspecified: Secondary | ICD-10-CM

## 2013-12-24 DIAGNOSIS — Z79899 Other long term (current) drug therapy: Secondary | ICD-10-CM | POA: Diagnosis not present

## 2013-12-24 LAB — POCT INR: INR: 2.6

## 2013-12-26 DIAGNOSIS — M545 Low back pain: Secondary | ICD-10-CM | POA: Diagnosis not present

## 2013-12-26 DIAGNOSIS — R1032 Left lower quadrant pain: Secondary | ICD-10-CM | POA: Diagnosis not present

## 2013-12-26 DIAGNOSIS — R350 Frequency of micturition: Secondary | ICD-10-CM | POA: Diagnosis not present

## 2013-12-26 DIAGNOSIS — I482 Chronic atrial fibrillation: Secondary | ICD-10-CM | POA: Diagnosis not present

## 2014-01-29 ENCOUNTER — Ambulatory Visit: Payer: Medicare Other | Admitting: Neurology

## 2014-02-04 ENCOUNTER — Ambulatory Visit (INDEPENDENT_AMBULATORY_CARE_PROVIDER_SITE_OTHER): Payer: Medicare Other | Admitting: Pharmacist Clinician (PhC)/ Clinical Pharmacy Specialist

## 2014-02-04 DIAGNOSIS — I482 Chronic atrial fibrillation, unspecified: Secondary | ICD-10-CM

## 2014-02-04 DIAGNOSIS — I4891 Unspecified atrial fibrillation: Secondary | ICD-10-CM

## 2014-02-04 DIAGNOSIS — Z79899 Other long term (current) drug therapy: Secondary | ICD-10-CM

## 2014-02-04 LAB — POCT INR: INR: 2.8

## 2014-03-18 ENCOUNTER — Ambulatory Visit (INDEPENDENT_AMBULATORY_CARE_PROVIDER_SITE_OTHER): Payer: Medicare Other | Admitting: Pharmacist Clinician (PhC)/ Clinical Pharmacy Specialist

## 2014-03-18 DIAGNOSIS — Z79899 Other long term (current) drug therapy: Secondary | ICD-10-CM

## 2014-03-18 DIAGNOSIS — I4891 Unspecified atrial fibrillation: Secondary | ICD-10-CM

## 2014-03-18 DIAGNOSIS — I482 Chronic atrial fibrillation, unspecified: Secondary | ICD-10-CM

## 2014-03-18 LAB — POCT INR: INR: 3.2

## 2014-04-16 ENCOUNTER — Ambulatory Visit: Payer: Self-pay | Admitting: Adult Health

## 2014-04-17 ENCOUNTER — Ambulatory Visit: Payer: Medicare Other | Admitting: Neurology

## 2014-04-17 ENCOUNTER — Encounter: Payer: Self-pay | Admitting: Adult Health

## 2014-04-17 ENCOUNTER — Ambulatory Visit (INDEPENDENT_AMBULATORY_CARE_PROVIDER_SITE_OTHER): Payer: Medicare Other | Admitting: Adult Health

## 2014-04-17 VITALS — BP 114/73 | HR 74 | Ht 69.0 in | Wt 204.0 lb

## 2014-04-17 DIAGNOSIS — Z8673 Personal history of transient ischemic attack (TIA), and cerebral infarction without residual deficits: Secondary | ICD-10-CM

## 2014-04-17 DIAGNOSIS — I482 Chronic atrial fibrillation, unspecified: Secondary | ICD-10-CM

## 2014-04-17 NOTE — Progress Notes (Signed)
I reviewed note and agree with plan.   Suanne MarkerVIKRAM R. PENUMALLI, MD 04/17/2014, 4:30 PM Certified in Neurology, Neurophysiology and Neuroimaging  [facility] [address], [address] [place], [place] [address] [phone]

## 2014-04-17 NOTE — Progress Notes (Signed)
PATIENT: Ross Lewis DOB: Sep 06, 1940  REASON FOR VISIT: follow up- history of cva, a. fib HISTORY FROM: patient  HISTORY OF PRESENT ILLNESS: [name] is a 74 year old male with a history of stroke. He returns today for follow-up. He is currently on Coumadin and tolerating it well. He denies any significant bruising or bleeding. His BP has been controlled metoprolol. Patient has a history of atrial fibrillation and is on Cardizem. Today his BP is 114/73. At the last visit his LDL was elevated- he has an appointment with his cardiologist next month and they plan recheck his lipids. Patient was no left with any residual symptoms after the stroke. He denies any stroke like symptoms since the last visit. No new medical issues since last seen.    HISTORY: [name] is a 74 year old gentleman who was admitted on 05/01/12 with sudden onset of right leg weakness which he noticed the evening before but he yet went to bed. He woke up the next day and noticed right leg was dragging while walking into the kitchen to make coffee. He denied associated headache, vertigo, double vision. He had history of atrial fibrillation and was on warfarin and INR was therapeutic at 2.06 On admission. CT scan of the head showed no acute abnormality. MRI scan of the brain showed small acute infarct in the left parietal region. Carotid ultrasound was unremarkable. Transthoracic echo showed normal ejection fraction. Total cholesterol was 152, LDL borderline at 100 mg percent. Patient was seen by physical occupational therapy. A1c was 6.1. Urine drug screen was negative. He was changed to Xarelto for stroke prevention and dischargd home and has done well with full recovery in his right leg strength. He however states he was unable to afford Xarelto due to cost and has switched back warfarin. His cardiologist Dr. Rennis GoldenHilty has also added a baby aspirin every other day. He seems to be tolerating this combination well without  significant bleeding or bruising. He has also decreased his metoprolol dose to 25 mg. He states his blood pressure is under good control and is 126 /64 in office today.   UPDATE 01/19/13 (LL): [name] comes to the office today for stroke follow up. He states he has been well and has no residual deficits from the stroke. He is tolerating Coumadin well, states his goal is between 2.5-3. He denies significant bleeding or bruising. He states his blood pressure is under good control and is 129/81 in office today.   UPDATE 07/25/13 (LL): Since last visit, patient has been doing well. No residual deficits from stroke. His Coumadin level stays in the 2.6-2.9 range, he denies significant bleeding or bruising.  His blood pressure is under good control, it is 199/69 in the office today.  He states his HR was 46 and 56 and two different times this past weekend, but he was not having any problems associated with it. Last lipid panel showed total cholesterol 171 and LDL 114.  No new complaints.  REVIEW OF SYSTEMS: Out of a complete 14 system review of symptoms, the patient complains only of the following symptoms, and all other reviewed systems are negative.  See HPI  ALLERGIES: No Known Allergies  HOME MEDICATIONS: Outpatient Prescriptions Prior to Visit  Medication Sig Dispense Refill  . diltiazem (CARDIZEM CD) 120 MG 24 hr capsule Take 1 capsule (120 mg total) by mouth daily. 90 capsule 3  . metoprolol succinate (TOPROL-XL) 25 MG 24 hr tablet Take 1 tablet (25 mg total)  by mouth daily. 90 tablet 3  . warfarin (COUMADIN) 5 MG tablet Take 1 to 1.5 tablets by mouth daily as directed by coumadin clinic 120 tablet 1   No facility-administered medications prior to visit.    PAST MEDICAL HISTORY: Past Medical History  Diagnosis Date  . TIA (transient ischemic attack)   . Atrial fibrillation   . Hypertension   . Stroke     right cerebellar infarct  . Nephrolithiasis   . Chronic back pain     PAST  SURGICAL HISTORY: Past Surgical History  Procedure Laterality Date  . Cardiac catheterization    . Cardioversion  11/01/2002    normal L main, normal LAD, L Cfx normal & nondominant; RCA normal & dominant (Dr. Erlene Quan)  . Tonsillectomy    . Transthoracic echocardiogram  04/2012    EF 50-65%, LA mod-severely dilated; RV mildly dilated; RA mod dilated    FAMILY HISTORY: History reviewed. No pertinent family history.  SOCIAL HISTORY: History   Social History  . Marital Status: Married    Spouse Name: N/A  . Number of Children: N/A  . Years of Education: N/A   Occupational History  . Not on file.   Social History Main Topics  . Smoking status: Never Smoker   . Smokeless tobacco: Never Used  . Alcohol Use: No  . Drug Use: No  . Sexual Activity:    Partners: Female   Other Topics Concern  . Not on file   Social History Narrative      PHYSICAL EXAM  Filed Vitals:   04/17/14 1529  BP: 114/73  Pulse: 74  Height:  (1.753 m)  Weight: 204 lb (92.534 kg)   Body mass index is 30.11 kg/(m^2).  Generalized: Well developed, in no acute distress   Neurological examination  Mentation: Alert oriented to time, place, history taking. Follows all commands speech and language fluent Cranial nerve II-XII: Pupils were equal round reactive to light. Extraocular movements were full, visual field were full on confrontational test. Facial sensation and strength were normal. Uvula tongue midline. Head turning and shoulder shrug  were normal and symmetric. Motor: The motor testing reveals 5 over 5 strength of all 4 extremities. Good symmetric motor tone is noted throughout.  Sensory: Sensory testing is intact to soft touch on all 4 extremities. No evidence of extinction is noted.  Coordination: Cerebellar testing reveals good finger-nose-finger and heel-to-shin bilaterally.  Gait and station: Gait is normal. Tandem gait is normal. Romberg is negative. No drift is seen.  Reflexes:  Deep tendon reflexes are symmetric and normal bilaterally.    DIAGNOSTIC DATA (LABS, IMAGING, TESTING) - I reviewed patient records, labs, notes, testing and imaging myself where available.  ASSESSMENT AND PLAN 74 y.o. year old male  has a past medical history of TIA (transient ischemic attack); Atrial fibrillation; Hypertension; Stroke; Nephrolithiasis; and Chronic back pain. here with:  1. Hx of stroke 2. Atrial Fibrillation  Continue warfarin for secondary prevention and maintain strict control of hypertension with blood pressure goal below 130/90 with LDL cholesterol goal below 100 mg percent. Patient advised that if he has any stroke like symptoms he should go to the ED immediately. F/U in 6 months with Dr. Pearlean Brownie.   Butch Penny, MSN, NP-C 04/17/2014, 3:30 PM [facility] [address], [address] [place], [place] [address] [phone]  Note: This document was prepared with digital dictation and possible smart phrase technology. Any transcriptional errors that result from this process are  unintentional.

## 2014-04-17 NOTE — Patient Instructions (Signed)
Continue warfarin for secondary prevention and maintain strict control of hypertension with blood pressure goal below 130/90 with LDL cholesterol goal below 100 mg percent

## 2014-04-19 NOTE — Progress Notes (Signed)
I agree with the above plan 

## 2014-04-29 ENCOUNTER — Ambulatory Visit (INDEPENDENT_AMBULATORY_CARE_PROVIDER_SITE_OTHER): Payer: Medicare Other | Admitting: Pharmacist Clinician (PhC)/ Clinical Pharmacy Specialist

## 2014-04-29 DIAGNOSIS — I482 Chronic atrial fibrillation, unspecified: Secondary | ICD-10-CM

## 2014-04-29 DIAGNOSIS — I4891 Unspecified atrial fibrillation: Secondary | ICD-10-CM

## 2014-04-29 DIAGNOSIS — Z79899 Other long term (current) drug therapy: Secondary | ICD-10-CM

## 2014-04-29 LAB — POCT INR: INR: 2.8

## 2014-05-13 ENCOUNTER — Ambulatory Visit (INDEPENDENT_AMBULATORY_CARE_PROVIDER_SITE_OTHER): Payer: Medicare Other | Admitting: Internal Medicine

## 2014-05-13 ENCOUNTER — Encounter: Payer: Self-pay | Admitting: Internal Medicine

## 2014-05-13 VITALS — BP 118/74 | HR 54 | Ht 68.0 in | Wt 202.1 lb

## 2014-05-13 DIAGNOSIS — I482 Chronic atrial fibrillation, unspecified: Secondary | ICD-10-CM

## 2014-05-13 DIAGNOSIS — I10 Essential (primary) hypertension: Secondary | ICD-10-CM

## 2014-05-13 DIAGNOSIS — Z7901 Long term (current) use of anticoagulants: Secondary | ICD-10-CM | POA: Diagnosis not present

## 2014-05-13 MED ORDER — METOPROLOL SUCCINATE ER 25 MG PO TB24: 25.0000 mg | ORAL_TABLET | Freq: Every day | ORAL | Status: DC

## 2014-05-13 MED ORDER — DILTIAZEM HCL ER COATED BEADS 120 MG PO CP24
120.0000 mg | ORAL_CAPSULE | Freq: Every day | ORAL | Status: DC
Start: 2014-05-13 — End: 2015-05-16

## 2014-05-13 NOTE — Progress Notes (Signed)
OFFICE NOTE  Chief Complaint:  Routine follow-up, no complaints  Primary Care Physician: Pcp Not In System  HPI:  Ross Lewis  is a 74 year old gentleman, who has a history of paroxysmal atrial fibrillation and a prior right cerebellar infarct. He has been on chronic Coumadin therapy. Recently, he has been in sinus rhythm. He had had headaches recently and there was concern about a possible stroke. He then presented to the hospital with an episode of foot drag on the right foot and some heaviness and was found to have a new TIA. This resolved fairly quickly without any further treatments; however, he was seen by neuro stroke service, Dr. Pearlean Brownie and recommended that he switch from Coumadin to Xarelto and was subsequently discharged. Since going home, he has felt fairly well; however, has had some problems with bradycardia. His Toprol XL was decreased to 25 mg daily during the hospitalization. He was maintained on Cardizem and as mentioned has switched to Xarelto. His main concern today is the cost of the Xarelto, which is about $322 a month since he does not have any supplemental drug coverage. But, otherwise, he has had no chest pain symptoms or worsening new stroke symptoms, shortness of breath, palpitations, presyncope, or syncopal symptoms.   I saw Ross Lewis back in the office today. He is maintained on warfarin due to cost issues but seems to be well-controlled. His last 2 INRs were therapeutic. He's had no bleeding complications. He maintains a heart rate in the 50s after decreasing his diltiazem at his last visit. He is asymptomatic with this and reports a heart rate increase with exercise. Generally he feels well. He denies any chest pain or shortness of breath. He is currently in atrial fibrillation with a slow ventricular response.  PMHx:  Past Medical History  Diagnosis Date  . TIA (transient ischemic attack)   . Atrial fibrillation   . Hypertension   . Stroke     right  cerebellar infarct  . Nephrolithiasis   . Chronic back pain     Past Surgical History  Procedure Laterality Date  . Cardiac catheterization    . Cardioversion  11/01/2002    normal L main, normal LAD, L Cfx normal & nondominant; RCA normal & dominant (Dr. Erlene Quan)  . Tonsillectomy    . Transthoracic echocardiogram  04/2012    EF 50-65%, LA mod-severely dilated; RV mildly dilated; RA mod dilated    FAMHx:  No family history on file.  SOCHx:   reports that he has never smoked. He has never used smokeless tobacco. He reports that he does not drink alcohol or use illicit drugs.  ALLERGIES:  No Known Allergies  ROS: A comprehensive review of systems was negative.  HOME MEDS: Current Outpatient Prescriptions  Medication Sig Dispense Refill  . diltiazem (CARDIZEM CD) 120 MG 24 hr capsule Take 1 capsule (120 mg total) by mouth daily. 90 capsule 3  . metoprolol succinate (TOPROL-XL) 25 MG 24 hr tablet Take 1 tablet (25 mg total) by mouth daily. 90 tablet 3  . warfarin (COUMADIN) 5 MG tablet Take 1 to 1.5 tablets by mouth daily as directed by coumadin clinic 120 tablet 1   No current facility-administered medications for this visit.    LABS/IMAGING: No results found for this or any previous visit (from the past 48 hour(s)). No results found.  VITALS: BP 118/74 mmHg  Pulse 54  Ht  (1.727 m)  Wt 202 lb 1.6 oz (91.672 kg)  BMI 30.74 kg/m2  EXAM: General appearance: alert and no distress Neck: no carotid bruit and no JVD Lungs: clear to auscultation bilaterally Heart: irregularly irregular rhythm Abdomen: soft, non-tender; bowel sounds normal; no masses,  no organomegaly Extremities: extremities normal, atraumatic, no cyanosis or edema Pulses: 2+ and symmetric Skin: Skin color, texture, turgor normal. No rashes or lesions Neurologic: Grossly normal PSych: Mood, affect normal  EKG: Atrial fibrillation with slow ventricular response at 54  ASSESSMENT: 1. PAF  (possibly more persistent A. fib) on warfarin 2. History of stroke and TIA 3. Hypertension 4. Bradycardic ventricular response  PLAN: 1.   Ross Lewis is doing fairly well and has managed to be fairly therapeutic on warfarin. He's had no further TIA or stroke. Blood pressure is well-controlled. He's had no bleeding complications on warfarin. Heart rate is low but he has a heart rate increase with exercise and denies any worsening shortness of breath, exercise intolerance, presyncope or syncopal episodes. Overall is doing very well we'll plan to see him back annually or sooner as necessary.  Chrystie NoseKenneth C. Hilty, MD, [facility] Attending Cardiologist [facility]  HILTY,Kenneth C 05/13/2014, 8:28 AM

## 2014-05-13 NOTE — Patient Instructions (Signed)
Your physician wants you to follow-up in: 1 year or sooner if needed with Dr. Rennis GoldenHilty. You will receive a reminder letter in the mail two months in advance. If you don't receive a letter, please call our office to schedule the follow-up appointment.

## 2014-06-10 ENCOUNTER — Ambulatory Visit (INDEPENDENT_AMBULATORY_CARE_PROVIDER_SITE_OTHER): Payer: Medicare Other | Admitting: Pharmacist Clinician (PhC)/ Clinical Pharmacy Specialist

## 2014-06-10 DIAGNOSIS — I482 Chronic atrial fibrillation, unspecified: Secondary | ICD-10-CM

## 2014-06-10 DIAGNOSIS — Z79899 Other long term (current) drug therapy: Secondary | ICD-10-CM | POA: Diagnosis not present

## 2014-06-10 DIAGNOSIS — Z7901 Long term (current) use of anticoagulants: Secondary | ICD-10-CM | POA: Diagnosis not present

## 2014-06-10 DIAGNOSIS — I4891 Unspecified atrial fibrillation: Secondary | ICD-10-CM | POA: Diagnosis not present

## 2014-06-10 LAB — POCT INR: INR: 2.6

## 2014-07-25 ENCOUNTER — Ambulatory Visit (INDEPENDENT_AMBULATORY_CARE_PROVIDER_SITE_OTHER): Payer: Medicare Other | Admitting: Pharmacist Clinician (PhC)/ Clinical Pharmacy Specialist

## 2014-07-25 DIAGNOSIS — I482 Chronic atrial fibrillation, unspecified: Secondary | ICD-10-CM

## 2014-07-25 DIAGNOSIS — I4891 Unspecified atrial fibrillation: Secondary | ICD-10-CM

## 2014-07-25 DIAGNOSIS — Z79899 Other long term (current) drug therapy: Secondary | ICD-10-CM

## 2014-07-25 DIAGNOSIS — Z7901 Long term (current) use of anticoagulants: Secondary | ICD-10-CM

## 2014-07-25 LAB — POCT INR: INR: 3.4

## 2014-07-25 MED ORDER — WARFARIN SODIUM 5 MG PO TABS: ORAL_TABLET | ORAL | Status: DC

## 2014-08-28 ENCOUNTER — Ambulatory Visit: Payer: Medicare Other | Admitting: Pharmacist Clinician (PhC)/ Clinical Pharmacy Specialist

## 2014-09-04 ENCOUNTER — Ambulatory Visit (INDEPENDENT_AMBULATORY_CARE_PROVIDER_SITE_OTHER): Payer: Medicare Other | Admitting: Pharmacist Clinician (PhC)/ Clinical Pharmacy Specialist

## 2014-09-04 DIAGNOSIS — I482 Chronic atrial fibrillation, unspecified: Secondary | ICD-10-CM

## 2014-09-04 DIAGNOSIS — Z7901 Long term (current) use of anticoagulants: Secondary | ICD-10-CM | POA: Diagnosis not present

## 2014-09-04 DIAGNOSIS — Z79899 Other long term (current) drug therapy: Secondary | ICD-10-CM

## 2014-09-04 DIAGNOSIS — I4891 Unspecified atrial fibrillation: Secondary | ICD-10-CM

## 2014-09-04 LAB — POCT INR: INR: 2.7

## 2014-10-14 ENCOUNTER — Ambulatory Visit (INDEPENDENT_AMBULATORY_CARE_PROVIDER_SITE_OTHER): Payer: Medicare Other | Admitting: Pharmacist Clinician (PhC)/ Clinical Pharmacy Specialist

## 2014-10-14 DIAGNOSIS — Z7901 Long term (current) use of anticoagulants: Secondary | ICD-10-CM

## 2014-10-14 DIAGNOSIS — Z79899 Other long term (current) drug therapy: Secondary | ICD-10-CM

## 2014-10-14 DIAGNOSIS — I4891 Unspecified atrial fibrillation: Secondary | ICD-10-CM

## 2014-10-14 DIAGNOSIS — I482 Chronic atrial fibrillation, unspecified: Secondary | ICD-10-CM

## 2014-10-14 LAB — POCT INR: INR: 3.4

## 2014-10-16 ENCOUNTER — Encounter: Payer: Self-pay | Admitting: Neurology

## 2014-10-16 ENCOUNTER — Ambulatory Visit (INDEPENDENT_AMBULATORY_CARE_PROVIDER_SITE_OTHER): Payer: Medicare Other | Admitting: Neurology

## 2014-10-16 VITALS — BP 127/72 | HR 61 | Ht 68.0 in | Wt 202.2 lb

## 2014-10-16 DIAGNOSIS — I6990 Unspecified sequelae of unspecified cerebrovascular disease: Secondary | ICD-10-CM | POA: Diagnosis not present

## 2014-10-16 NOTE — Progress Notes (Signed)
PATIENT: Ross Lewis DOB: 06/09/1940  REASON FOR VISIT: follow up- history of cva, a. fib HISTORY FROM: patient  HISTORY OF PRESENT ILLNESS: [name] is a 74 year old male with a history of stroke. He returns today for follow-up. He is currently on Coumadin and tolerating it well. He denies any significant bruising or bleeding. His BP has been controlled metoprolol. Patient has a history of atrial fibrillation and is on Cardizem. Today his BP is 114/73. At the last visit his LDL was elevated- he has an appointment with his cardiologist next month and they plan recheck his lipids. Patient was no left with any residual symptoms after the stroke. He denies any stroke like symptoms since the last visit. No new medical issues since last seen.    HISTORY: [name] is a 74 year old gentleman who was admitted on 05/01/12 with sudden onset of right leg weakness which he noticed the evening before but he yet went to bed. He woke up the next day and noticed right leg was dragging while walking into the kitchen to make coffee. He denied associated headache, vertigo, double vision. He had history of atrial fibrillation and was on warfarin and INR was therapeutic at 2.06 On admission. CT scan of the head showed no acute abnormality. MRI scan of the brain showed small acute infarct in the left parietal region. Carotid ultrasound was unremarkable. Transthoracic echo showed normal ejection fraction. Total cholesterol was 152, LDL borderline at 100 mg percent. Patient was seen by physical occupational therapy. A1c was 6.1. Urine drug screen was negative. He was changed to Xarelto for stroke prevention and dischargd home and has done well with full recovery in his right leg strength. He however states he was unable to afford Xarelto due to cost and has switched back warfarin. His cardiologist Dr. Rennis Golden has also added a baby aspirin every other day. He seems to be tolerating this combination well without  significant bleeding or bruising. He has also decreased his metoprolol dose to 25 mg. He states his blood pressure is under good control and is 126 /64 in office today.   UPDATE 01/19/13 (LL): Mr. Recore comes to the office today for stroke follow up. He states he has been well and has no residual deficits from the stroke. He is tolerating Coumadin well, states his goal is between 2.5-3. He denies significant bleeding or bruising. He states his blood pressure is under good control and is 129/81 in office today.   UPDATE 07/25/13 (LL): Since last visit, patient has been doing well. No residual deficits from stroke. His Coumadin level stays in the 2.6-2.9 range, he denies significant bleeding or bruising.  His blood pressure is under good control, it is 199/69 in the office today.  He states his HR was 46 and 56 and two different times this past weekend, but he was not having any problems associated with it. Last lipid panel showed total cholesterol 171 and LDL 114.  No new complaints. Update 10/16/2014 : He returns for follow-up after last visit more than a year ago. Continues to do well and has not had recurrent neurovascular symptoms now for last 2-3 years. He remains on warfarin which is tolerating well with only occasional bruising but no bleeding episodes. His INR has been quite stable and needs to be checked only every 6 weeks. Patient remains on metoprolol as well as Cardizem and is tolerating them without any side effects. His blood pressure is well controlled and is 127/72 .  He has not had carotid ultrasound checked in couple of years. REVIEW OF SYSTEMS: Out of a complete 14 system review of symptoms, the patient complains only of the following symptoms, and all other reviewed systems are negative. No complaints See HPI  ALLERGIES: No Known Allergies  HOME MEDICATIONS: Outpatient Prescriptions Prior to Visit  Medication Sig Dispense Refill  . diltiazem (CARDIZEM CD) 120 MG 24 hr capsule Take 1  capsule (120 mg total) by mouth daily. 90 capsule 3  . metoprolol succinate (TOPROL-XL) 25 MG 24 hr tablet Take 1 tablet (25 mg total) by mouth daily. 90 tablet 3  . warfarin (COUMADIN) 5 MG tablet Take 1 to 1.5 tablets by mouth daily as directed by coumadin clinic 120 tablet 1   No facility-administered medications prior to visit.    PAST MEDICAL HISTORY: Past Medical History  Diagnosis Date  . TIA (transient ischemic attack)   . Atrial fibrillation   . Hypertension   . Stroke     right cerebellar infarct  . Nephrolithiasis   . Chronic back pain     PAST SURGICAL HISTORY: Past Surgical History  Procedure Laterality Date  . Cardiac catheterization    . Cardioversion  11/01/2002    normal L main, normal LAD, L Cfx normal & nondominant; RCA normal & dominant (Dr. Erlene Quan)  . Tonsillectomy    . Transthoracic echocardiogram  04/2012    EF 50-65%, LA mod-severely dilated; RV mildly dilated; RA mod dilated    FAMILY HISTORY: History reviewed. No pertinent family history.  SOCIAL HISTORY: Social History   Social History  . Marital Status: Married    Spouse Name: N/A  . Number of Children: N/A  . Years of Education: N/A   Occupational History  . Not on file.   Social History Main Topics  . Smoking status: Never Smoker   . Smokeless tobacco: Never Used  . Alcohol Use: No  . Drug Use: No  . Sexual Activity:    Partners: Female   Other Topics Concern  . Not on file   Social History Narrative      PHYSICAL EXAM  Filed Vitals:   10/16/14 0904  BP: 127/72  Pulse: 61  Height:  (1.727 m)  Weight: 202 lb 3.2 oz (91.717 kg)   Body mass index is 30.75 kg/(m^2).  Generalized: Well developed, in no acute distress   Neurological examination  Mentation: Alert oriented to time, place, history taking. Follows all commands speech and language fluent Cranial nerve II-XII: Pupils were equal round reactive to light. Extraocular movements were full, visual field were  full on confrontational test. Facial sensation and strength were normal. Uvula tongue midline. Head turning and shoulder shrug  were normal and symmetric. Motor: The motor testing reveals 5 over 5 strength of all 4 extremities. Good symmetric motor tone is noted throughout.  Sensory: Sensory testing is intact to soft touch on all 4 extremities. No evidence of extinction is noted.  Coordination: Cerebellar testing reveals good finger-nose-finger and heel-to-shin bilaterally.  Gait and station: Gait is normal. Tandem gait is slightly difficult. Romberg is negative. No drift is seen.  Reflexes: Deep tendon reflexes are symmetric and normal bilaterally.    DIAGNOSTIC DATA (LABS, IMAGING, TESTING) - I reviewed patient records, labs, notes, testing and imaging myself where available.  ASSESSMENT AND PLAN 74 y.o. year old male  has a past medical history of TIA (transient ischemic attack); Atrial fibrillation; Hypertension; Stroke; Nephrolithiasis; and Chronic back pain. here with:  1. Hx of stroke 2. Atrial Fibrillation  Continue warfarin for secondary prevention and maintain strict control of hypertension with blood pressure goal below 130/90 with LDL cholesterol goal below 100 mg percent. Check screening carotid ultrasound study. Since patient has been free from neurovascular symptoms for the last 2-3 years no routine follow-up appointment is necessary with me. Greater than 50% of time of this 25 minutes visit was spent in counseling and coordination of care. Patient can be referred back in the future only as necessary. Delia Heady, MD  10/16/2014, 6:22 PM [facility] [address] [place], [place] [address] [phone]  Note: This document was prepared with digital dictation and possible smart phrase technology. Any transcriptional errors that result from this process are unintentional.

## 2014-11-18 ENCOUNTER — Telehealth: Payer: Self-pay | Admitting: Internal Medicine

## 2014-11-18 NOTE — Telephone Encounter (Signed)
Routed to [place] as [employer]

## 2014-11-18 NOTE — Telephone Encounter (Signed)
She wanted you to know they have switched manufacturer on the Warfarin.

## 2014-11-25 ENCOUNTER — Ambulatory Visit (INDEPENDENT_AMBULATORY_CARE_PROVIDER_SITE_OTHER): Payer: Medicare Other | Admitting: Pharmacist Clinician (PhC)/ Clinical Pharmacy Specialist

## 2014-11-25 DIAGNOSIS — I482 Chronic atrial fibrillation, unspecified: Secondary | ICD-10-CM

## 2014-11-25 DIAGNOSIS — Z79899 Other long term (current) drug therapy: Secondary | ICD-10-CM | POA: Diagnosis not present

## 2014-11-25 DIAGNOSIS — Z7901 Long term (current) use of anticoagulants: Secondary | ICD-10-CM | POA: Diagnosis not present

## 2014-11-25 DIAGNOSIS — I4891 Unspecified atrial fibrillation: Secondary | ICD-10-CM

## 2014-11-25 LAB — POCT INR: INR: 3

## 2014-12-26 NOTE — Telephone Encounter (Signed)
Error

## 2015-01-06 ENCOUNTER — Ambulatory Visit (INDEPENDENT_AMBULATORY_CARE_PROVIDER_SITE_OTHER): Payer: Medicare Other | Admitting: Pharmacist Clinician (PhC)/ Clinical Pharmacy Specialist

## 2015-01-06 DIAGNOSIS — Z79899 Other long term (current) drug therapy: Secondary | ICD-10-CM | POA: Diagnosis not present

## 2015-01-06 DIAGNOSIS — I482 Chronic atrial fibrillation, unspecified: Secondary | ICD-10-CM

## 2015-01-06 DIAGNOSIS — Z7901 Long term (current) use of anticoagulants: Secondary | ICD-10-CM

## 2015-01-06 DIAGNOSIS — I4891 Unspecified atrial fibrillation: Secondary | ICD-10-CM | POA: Diagnosis not present

## 2015-01-06 LAB — POCT INR: INR: 2.9

## 2015-01-28 DIAGNOSIS — J069 Acute upper respiratory infection, unspecified: Secondary | ICD-10-CM | POA: Diagnosis not present

## 2015-02-17 ENCOUNTER — Ambulatory Visit (INDEPENDENT_AMBULATORY_CARE_PROVIDER_SITE_OTHER): Payer: Medicare Other | Admitting: Pharmacist Clinician (PhC)/ Clinical Pharmacy Specialist

## 2015-02-17 DIAGNOSIS — Z7901 Long term (current) use of anticoagulants: Secondary | ICD-10-CM | POA: Diagnosis not present

## 2015-02-17 DIAGNOSIS — Z79899 Other long term (current) drug therapy: Secondary | ICD-10-CM | POA: Diagnosis not present

## 2015-02-17 DIAGNOSIS — I4891 Unspecified atrial fibrillation: Secondary | ICD-10-CM | POA: Diagnosis not present

## 2015-02-17 DIAGNOSIS — I482 Chronic atrial fibrillation, unspecified: Secondary | ICD-10-CM

## 2015-02-17 LAB — POCT INR: INR: 3.7

## 2015-02-17 MED ORDER — WARFARIN SODIUM 5 MG PO TABS: ORAL_TABLET | ORAL | Status: DC

## 2015-03-10 ENCOUNTER — Encounter: Payer: Medicare Other | Admitting: Pharmacist Clinician (PhC)/ Clinical Pharmacy Specialist

## 2015-03-13 ENCOUNTER — Ambulatory Visit (INDEPENDENT_AMBULATORY_CARE_PROVIDER_SITE_OTHER): Payer: Medicare Other | Admitting: Pharmacist Clinician (PhC)/ Clinical Pharmacy Specialist

## 2015-03-13 DIAGNOSIS — I482 Chronic atrial fibrillation, unspecified: Secondary | ICD-10-CM

## 2015-03-13 DIAGNOSIS — Z7901 Long term (current) use of anticoagulants: Secondary | ICD-10-CM

## 2015-03-13 DIAGNOSIS — I4891 Unspecified atrial fibrillation: Secondary | ICD-10-CM

## 2015-03-13 DIAGNOSIS — Z79899 Other long term (current) drug therapy: Secondary | ICD-10-CM

## 2015-03-13 LAB — POCT INR: INR: 3.5

## 2015-04-04 ENCOUNTER — Ambulatory Visit (INDEPENDENT_AMBULATORY_CARE_PROVIDER_SITE_OTHER): Payer: Medicare Other | Admitting: Pharmacist Clinician (PhC)/ Clinical Pharmacy Specialist

## 2015-04-04 DIAGNOSIS — I482 Chronic atrial fibrillation, unspecified: Secondary | ICD-10-CM

## 2015-04-04 DIAGNOSIS — Z79899 Other long term (current) drug therapy: Secondary | ICD-10-CM

## 2015-04-04 DIAGNOSIS — I4891 Unspecified atrial fibrillation: Secondary | ICD-10-CM

## 2015-04-04 DIAGNOSIS — Z7901 Long term (current) use of anticoagulants: Secondary | ICD-10-CM | POA: Diagnosis not present

## 2015-04-04 LAB — POCT INR: INR: 3.2

## 2015-04-18 ENCOUNTER — Ambulatory Visit (INDEPENDENT_AMBULATORY_CARE_PROVIDER_SITE_OTHER): Payer: Medicare Other | Admitting: Pharmacist Clinician (PhC)/ Clinical Pharmacy Specialist

## 2015-04-18 DIAGNOSIS — I4891 Unspecified atrial fibrillation: Secondary | ICD-10-CM | POA: Diagnosis not present

## 2015-04-18 DIAGNOSIS — Z7901 Long term (current) use of anticoagulants: Secondary | ICD-10-CM

## 2015-04-18 DIAGNOSIS — I482 Chronic atrial fibrillation, unspecified: Secondary | ICD-10-CM

## 2015-04-18 DIAGNOSIS — Z79899 Other long term (current) drug therapy: Secondary | ICD-10-CM | POA: Diagnosis not present

## 2015-04-18 LAB — POCT INR: INR: 2.4

## 2015-05-16 ENCOUNTER — Ambulatory Visit (INDEPENDENT_AMBULATORY_CARE_PROVIDER_SITE_OTHER): Payer: Medicare Other | Admitting: Pharmacist Clinician (PhC)/ Clinical Pharmacy Specialist

## 2015-05-16 ENCOUNTER — Other Ambulatory Visit: Payer: Self-pay | Admitting: Internal Medicine

## 2015-05-16 DIAGNOSIS — I482 Chronic atrial fibrillation, unspecified: Secondary | ICD-10-CM

## 2015-05-16 DIAGNOSIS — Z7901 Long term (current) use of anticoagulants: Secondary | ICD-10-CM | POA: Diagnosis not present

## 2015-05-16 DIAGNOSIS — Z79899 Other long term (current) drug therapy: Secondary | ICD-10-CM

## 2015-05-16 DIAGNOSIS — I4891 Unspecified atrial fibrillation: Secondary | ICD-10-CM

## 2015-05-16 LAB — POCT INR: INR: 2.2

## 2015-05-16 NOTE — Telephone Encounter (Signed)
Rx request sent to pharmacy.  

## 2015-06-02 ENCOUNTER — Observation Stay (HOSPITAL_COMMUNITY)
Admission: EM | Admit: 2015-06-02 | Discharge: 2015-06-04 | Disposition: A | Discharge status: Home / Self Care | Payer: Medicare Other | Attending: Internal Medicine | Admitting: Internal Medicine

## 2015-06-02 ENCOUNTER — Emergency Department (HOSPITAL_COMMUNITY): Payer: Medicare Other

## 2015-06-02 ENCOUNTER — Encounter (HOSPITAL_COMMUNITY): Payer: Self-pay | Admitting: Emergency Medicine

## 2015-06-02 DIAGNOSIS — N200 Calculus of kidney: Secondary | ICD-10-CM | POA: Diagnosis not present

## 2015-06-02 DIAGNOSIS — Z7901 Long term (current) use of anticoagulants: Secondary | ICD-10-CM

## 2015-06-02 DIAGNOSIS — I482 Chronic atrial fibrillation, unspecified: Secondary | ICD-10-CM | POA: Diagnosis present

## 2015-06-02 DIAGNOSIS — Z8249 Family history of ischemic heart disease and other diseases of the circulatory system: Secondary | ICD-10-CM | POA: Insufficient documentation

## 2015-06-02 DIAGNOSIS — N201 Calculus of ureter: Secondary | ICD-10-CM | POA: Insufficient documentation

## 2015-06-02 DIAGNOSIS — R319 Hematuria, unspecified: Secondary | ICD-10-CM | POA: Insufficient documentation

## 2015-06-02 DIAGNOSIS — I4891 Unspecified atrial fibrillation: Secondary | ICD-10-CM

## 2015-06-02 DIAGNOSIS — R310 Gross hematuria: Secondary | ICD-10-CM | POA: Diagnosis not present

## 2015-06-02 DIAGNOSIS — G8929 Other chronic pain: Secondary | ICD-10-CM | POA: Insufficient documentation

## 2015-06-02 DIAGNOSIS — M545 Low back pain, unspecified: Secondary | ICD-10-CM | POA: Diagnosis present

## 2015-06-02 DIAGNOSIS — Z79899 Other long term (current) drug therapy: Secondary | ICD-10-CM | POA: Diagnosis not present

## 2015-06-02 DIAGNOSIS — N132 Hydronephrosis with renal and ureteral calculous obstruction: Secondary | ICD-10-CM | POA: Diagnosis not present

## 2015-06-02 DIAGNOSIS — N401 Enlarged prostate with lower urinary tract symptoms: Secondary | ICD-10-CM | POA: Insufficient documentation

## 2015-06-02 DIAGNOSIS — Z5181 Encounter for therapeutic drug level monitoring: Secondary | ICD-10-CM

## 2015-06-02 DIAGNOSIS — N189 Chronic kidney disease, unspecified: Secondary | ICD-10-CM | POA: Diagnosis present

## 2015-06-02 DIAGNOSIS — Z87442 Personal history of urinary calculi: Secondary | ICD-10-CM | POA: Diagnosis not present

## 2015-06-02 DIAGNOSIS — N1330 Unspecified hydronephrosis: Secondary | ICD-10-CM | POA: Diagnosis present

## 2015-06-02 DIAGNOSIS — Z8673 Personal history of transient ischemic attack (TIA), and cerebral infarction without residual deficits: Secondary | ICD-10-CM | POA: Insufficient documentation

## 2015-06-02 DIAGNOSIS — I129 Hypertensive chronic kidney disease with stage 1 through stage 4 chronic kidney disease, or unspecified chronic kidney disease: Secondary | ICD-10-CM | POA: Insufficient documentation

## 2015-06-02 DIAGNOSIS — N179 Acute kidney failure, unspecified: Secondary | ICD-10-CM | POA: Diagnosis not present

## 2015-06-02 DIAGNOSIS — G459 Transient cerebral ischemic attack, unspecified: Secondary | ICD-10-CM | POA: Diagnosis present

## 2015-06-02 DIAGNOSIS — I10 Essential (primary) hypertension: Secondary | ICD-10-CM | POA: Diagnosis present

## 2015-06-02 HISTORY — DX: Chronic kidney disease, stage 2 (mild): N18.2

## 2015-06-02 LAB — URINALYSIS, ROUTINE W REFLEX MICROSCOPIC
BILIRUBIN URINE: NEGATIVE
Glucose, UA: NEGATIVE mg/dL
KETONES UR: NEGATIVE mg/dL
Leukocytes, UA: NEGATIVE
Nitrite: NEGATIVE
PH: 5 (ref 5.0–8.0)
Protein, ur: NEGATIVE mg/dL
SPECIFIC GRAVITY, URINE: 1.026 (ref 1.005–1.030)

## 2015-06-02 LAB — PROTIME-INR
INR: 2.3 — AB (ref 0.00–1.49)
PROTHROMBIN TIME: 25.1 s — AB (ref 11.6–15.2)

## 2015-06-02 LAB — CBC
HCT: 49.6 % (ref 39.0–52.0)
Hemoglobin: 16.6 g/dL (ref 13.0–17.0)
MCH: 32.6 pg (ref 26.0–34.0)
MCHC: 33.5 g/dL (ref 30.0–36.0)
MCV: 97.4 fL (ref 78.0–100.0)
Platelets: 209 10*3/uL (ref 150–400)
RBC: 5.09 MIL/uL (ref 4.22–5.81)
RDW: 13.6 % (ref 11.5–15.5)
WBC: 9.7 10*3/uL (ref 4.0–10.5)

## 2015-06-02 LAB — URINE MICROSCOPIC-ADD ON

## 2015-06-02 LAB — BASIC METABOLIC PANEL
Anion gap: 7 (ref 5–15)
BUN: 17 mg/dL (ref 6–20)
CHLORIDE: 110 mmol/L (ref 101–111)
CO2: 25 mmol/L (ref 22–32)
CREATININE: 1.5 mg/dL — AB (ref 0.61–1.24)
Calcium: 9.4 mg/dL (ref 8.9–10.3)
GFR calc Af Amer: 50 mL/min — ABNORMAL LOW (ref >60)
GFR calc non Af Amer: 43 mL/min — ABNORMAL LOW (ref >60)
GLUCOSE: 117 mg/dL — AB (ref 65–99)
Potassium: 4.6 mmol/L (ref 3.5–5.1)
Sodium: 142 mmol/L (ref 135–145)

## 2015-06-02 LAB — DIFFERENTIAL
BASOS ABS: 0 10*3/uL (ref 0.0–0.1)
BASOS PCT: 0 %
EOS ABS: 0.2 10*3/uL (ref 0.0–0.7)
Eosinophils Relative: 2 %
Lymphocytes Relative: 28 %
Lymphs Abs: 2.7 10*3/uL (ref 0.7–4.0)
Monocytes Absolute: 1 10*3/uL (ref 0.1–1.0)
Monocytes Relative: 10 %
NEUTROS PCT: 60 %
Neutro Abs: 5.7 10*3/uL (ref 1.7–7.7)

## 2015-06-02 LAB — APTT: APTT: 40 s — AB (ref 24–37)

## 2015-06-02 MED ORDER — ONDANSETRON HCL 4 MG/2ML IJ SOLN
4.0000 mg | Freq: Once | INTRAMUSCULAR | Status: AC
  Administered 2015-06-02: 4 mg via INTRAVENOUS
  Filled 2015-06-02: qty 2

## 2015-06-02 MED ORDER — HYDRALAZINE HCL 20 MG/ML IJ SOLN: 5.0000 mg | INTRAMUSCULAR | Status: DC | PRN

## 2015-06-02 MED ORDER — ACETAMINOPHEN 500 MG PO TABS: 500.0000 mg | ORAL_TABLET | Freq: Every day | ORAL | Status: DC | PRN

## 2015-06-02 MED ORDER — HYDROCODONE-ACETAMINOPHEN 5-325 MG PO TABS
1.0000 | ORAL_TABLET | Freq: Once | ORAL | Status: AC
  Administered 2015-06-02: 1 via ORAL
  Filled 2015-06-02: qty 1

## 2015-06-02 MED ORDER — SODIUM CHLORIDE 0.9 % IV BOLUS (SEPSIS)
500.0000 mL | Freq: Once | INTRAVENOUS | Status: AC
  Administered 2015-06-02: 500 mL via INTRAVENOUS

## 2015-06-02 MED ORDER — SODIUM CHLORIDE 0.9% FLUSH
3.0000 mL | Freq: Two times a day (BID) | INTRAVENOUS | Status: DC
  Administered 2015-06-02 – 2015-06-03 (×2): 3 mL via INTRAVENOUS

## 2015-06-02 MED ORDER — MORPHINE SULFATE (PF) 4 MG/ML IV SOLN
4.0000 mg | Freq: Once | INTRAVENOUS | Status: AC
  Administered 2015-06-02: 4 mg via INTRAVENOUS
  Filled 2015-06-02: qty 1

## 2015-06-02 MED ORDER — DILTIAZEM HCL ER COATED BEADS 120 MG PO CP24: 120.0000 mg | ORAL_CAPSULE | Freq: Every day | ORAL | Status: DC

## 2015-06-02 MED ORDER — DILTIAZEM HCL ER COATED BEADS 120 MG PO CP24
120.0000 mg | ORAL_CAPSULE | Freq: Once | ORAL | Status: AC
  Administered 2015-06-02: 120 mg via ORAL
  Filled 2015-06-02: qty 1

## 2015-06-02 MED ORDER — METOPROLOL SUCCINATE ER 25 MG PO TB24
25.0000 mg | ORAL_TABLET | Freq: Every day | ORAL | Status: DC
Start: 2015-06-03 — End: 2015-06-04
  Administered 2015-06-04: 25 mg via ORAL
  Filled 2015-06-02: qty 1

## 2015-06-02 MED ORDER — METOPROLOL SUCCINATE ER 25 MG PO TB24
25.0000 mg | ORAL_TABLET | Freq: Every day | ORAL | Status: DC
  Administered 2015-06-02: 25 mg via ORAL
  Filled 2015-06-02: qty 1

## 2015-06-02 MED ORDER — IOPAMIDOL (ISOVUE-370) INJECTION 76%
100.0000 mL | Freq: Once | INTRAVENOUS | Status: AC | PRN
  Administered 2015-06-02: 100 mL via INTRAVENOUS

## 2015-06-02 MED ORDER — ONDANSETRON HCL 4 MG/2ML IJ SOLN: 4.0000 mg | Freq: Three times a day (TID) | INTRAMUSCULAR | Status: DC | PRN

## 2015-06-02 MED ORDER — MORPHINE SULFATE (PF) 2 MG/ML IV SOLN
2.0000 mg | INTRAVENOUS | Status: DC | PRN
  Administered 2015-06-03: 2 mg via INTRAVENOUS
  Filled 2015-06-02: qty 1

## 2015-06-02 MED ORDER — HYDROCODONE-ACETAMINOPHEN 5-325 MG PO TABS: 1.0000 | ORAL_TABLET | Freq: Once | ORAL | Status: DC

## 2015-06-02 MED ORDER — HYDROCODONE-ACETAMINOPHEN 5-325 MG PO TABS: 1.0000 | ORAL_TABLET | ORAL | Status: DC | PRN

## 2015-06-02 MED ORDER — SODIUM CHLORIDE 0.9 % IV SOLN
INTRAVENOUS | Status: DC
Start: 2015-06-02 — End: 2015-06-04
  Administered 2015-06-02: 23:00:00 via INTRAVENOUS
  Administered 2015-06-03 (×2): 100 mL/h via INTRAVENOUS

## 2015-06-02 NOTE — ED Notes (Addendum)
Attending aware of patient's blood pressure. Patient's home blood pressure medications ordered and administered. He has not taken them since yesterday.

## 2015-06-02 NOTE — ED Notes (Signed)
Pt c/o L flank pain and hematuria x 2 hours. Pt has hx of kidney stones and sts "I know exactly what this is." Pt A&Ox4 and ambulatory.

## 2015-06-02 NOTE — H&P (Signed)
[facility] History and Physical  Ross Lewis ZOX:[id number] DOB: 1940/11/23 DOA: 06/02/2015  Referring physician: ED physician PCP: Pcp Not In System  Specialists:   Chief Complaint: pain over left flank and left lower abdomen, and hematuria  HPI: Ross Lewis is a 75 y.o. male with PMH of recurrent kidney stone, hypertension, TIA/stroke, atrial fibrillation on Coumadin, CKD-II, chronic back pain, who presents with left-sided flank pain and left lower abdominal pain, hematuria.  Pt reports that he started having flank pain and hematuria today, which is similar with previous pain when he had kidney stone. The pain is located in the left lower quadrant and left flank area, constant, 10 out of 10 in severity, nonradiating. It is not aggravated or alleviated by any known factors. It is associated with nausea, but no vomiting, fever or chills. Patient does not have chest pain, shortness of breath, cough, diarrhea or unilateral weakness.  In ED, patient was found to have negative urinalysis for UTI, but positive for hemoglobin, WBC 9.7, INR 2.3, temperature normal, no tachycardia, worsening renal function. Patient admitted to inpatient for further eval and treatment. Urology was consulted with EDP  CT angiogram of abdomen/pelvis showed obstructive uropathy on the left with moderate left hydronephrosis and proximal hydroureter secondary to 2 adjacent small calculi in the mid left ureter measuring 4 mm and 5 mm in respective greatest caliber; normal vasculature without evidence of aortic aneurysm or dissection; resolution of high density cyst emanating off the inferior aspect of the right kidney since prior imaging; other benign-appearing cysts of the liver and right kidney are either smaller or larger.   # CT-pelvis on 09/14/06 showed 3.9 mm stone in the urinary bladder in the near the left UVJ results in left-sided hydronephrosis # CT-abd/pelvis on 09/09/09 showed punctate right  ureterovesicular junction calculus with mild to moderate proximal obstruction and mild prostatomegaly with a right-sided bladder diverticulum.  EKG: Not done in ED, will get one.   Where does patient live?   At home  Can patient participate in ADLs?  Yes    Review of Systems:   General: no fevers, chills, no changes in body weight, has fatigue HEENT: no blurry vision, hearing changes or sore throat Pulm: no dyspnea, coughing, wheezing CV: no chest pain, no palpitations Abd: has nausea, abdominal pain and left flank pain, no diarrhea, constipationvomiting, GU: has hematuria  Ext: no leg edema Neuro: no unilateral weakness, numbness, or tingling, no vision change or hearing loss Skin: no rash MSK: No muscle spasm, no deformity, no limitation of range of movement in spin Heme: No easy bruising.  Travel history: No recent long distant travel.  Allergy: No Known Allergies  Past Medical History  Diagnosis Date  . TIA (transient ischemic attack)   . Atrial fibrillation ([name])   . Hypertension   . Stroke [facility])     right cerebellar infarct  . Nephrolithiasis   . Chronic back pain   . CKD (chronic kidney disease), stage II     Past Surgical History  Procedure Laterality Date  . Cardiac catheterization    . Cardioversion  11/01/2002    normal L main, normal LAD, L Cfx normal & nondominant; RCA normal & dominant (Dr. Erlene Quan)  . Tonsillectomy    . Transthoracic echocardiogram  04/2012    EF 50-65%, LA mod-severely dilated; RV mildly dilated; RA mod dilated    Social History:  reports that he has never smoked. He has never used smokeless tobacco. He reports that  he does not drink alcohol or use illicit drugs.  Family History:  Family History  Problem Relation Age of Onset  . Heart attack Father   . Coronary artery disease Brother      Prior to Admission medications   Medication Sig Start Date End Date Taking? Authorizing Provider  acetaminophen (TYLENOL) 500 MG tablet Take 500  mg by mouth daily as needed for moderate pain or headache.   Yes Historical Provider, MD  CARTIA XT 120 MG 24 hr capsule TAKE 1 CAPSULE BY MOUTH DAILY. 05/16/15  Yes Chrystie Nose, MD  metoprolol succinate (TOPROL-XL) 25 MG 24 hr tablet Take 1 tablet (25 mg total) by mouth daily. 05/13/14  Yes Chrystie Nose, MD  warfarin (COUMADIN) 5 MG tablet Take 1 to 1.5 tablets by mouth daily as directed by coumadin clinic Patient taking differently: Takes s daily except on Friday take 2.5mg s 02/17/15  Yes Chrystie Nose, MD    Physical Exam: Filed Vitals:   06/02/15 2000 06/02/15 2030 06/02/15 2040 06/02/15 2100  BP: 166/118 181/99 181/99 184/101  Pulse: 84 79 77 67  Temp:   98.3 F (36.8 C)   TempSrc:   Oral   Resp:   18   SpO2: 99% 97% 99% 99%   General: Not in acute distress HEENT:       Eyes: PERRL, EOMI, no scleral icterus.       ENT: No discharge from the ears and nose, no pharynx injection, no tonsillar enlargement.        Neck: No JVD, no bruit, no mass felt. Heme: No neck lymph node enlargement. Cardiac: S1/S2, RRR, No murmurs, No gallops or rubs. Pulm: No rales, wheezing, rhonchi or rubs. Abd: Soft, nondistended, mild tender over LLQ, no rebound pain, no organomegaly, BS present. GU: has left CVA tenderness. Ext: No pitting leg edema bilaterally. 2+DP/PT pulse bilaterally. Musculoskeletal: No joint deformities, No joint redness or warmth, no limitation of ROM in spin. Skin: No rashes.  Neuro: Alert, oriented X3, cranial nerves II-XII grossly intact, moves all extremities normally. Psych: Patient is not psychotic, no suicidal or hemocidal ideation.  Labs on Admission:  Basic Metabolic Panel:  Recent Labs Lab 06/02/15 1535  NA 142  K 4.6  CL 110  CO2 25  GLUCOSE 117*  BUN 17  CREATININE 1.50*  CALCIUM 9.4   Liver Function Tests: No results for input(s): AST, ALT, ALKPHOS, BILITOT, PROT, ALBUMIN in the last 168 hours. No results for input(s): LIPASE, AMYLASE in the  last 168 hours. No results for input(s): AMMONIA in the last 168 hours. CBC:  Recent Labs Lab 06/02/15 1535  WBC 9.7  NEUTROABS 5.7  HGB 16.6  HCT 49.6  MCV 97.4  PLT 209   Cardiac Enzymes: No results for input(s): CKTOTAL, CKMB, CKMBINDEX, TROPONINI in the last 168 hours.  BNP (last 3 results) No results for input(s): BNP in the last 8760 hours.  ProBNP (last 3 results) No results for input(s): PROBNP in the last 8760 hours.  CBG: No results for input(s): GLUCAP in the last 168 hours.  Radiological Exams on Admission: Ct Cta Abd/pel W/cm &/or W/o Cm  06/02/2015  CLINICAL DATA:  Left-sided flank pain and hematuria. EXAM: CTA ABDOMEN AND PELVIS WITHOUT CONTRAST TECHNIQUE: Multidetector CT imaging of the abdomen and pelvis was performed using the standard protocol during bolus administration of intravenous contrast. Multiplanar reconstructed images and MIPs were obtained and reviewed to evaluate the vascular anatomy. CONTRAST:  100 mL Isovue 370  IV COMPARISON:  09/25/2009 CT of abdomen and pelvis at [facility] FINDINGS: Moderate left hydronephrosis and proximal hydroureter present as well as perinephric edema. There are 2 separate adjacent calculi in the mid ureter measuring approximately 4 and 5 mm each in greatest caliber. No other urinary tract calculi identified. CTA demonstrates a normal appearance to the abdominal aorta, iliac arteries and common femoral arteries. No evidence of aneurysmal disease or dissection. Major branch vessels are all normally patent. Posterior right hepatic cyst shows decrease in size since the prior study and measures 2.1 cm (approximately 3 cm previously). Simple cyst of the anterior interpolar right kidney measures 3.7 cm (2.9 cm previously) posterior interpolar right renal cyst shows decrease in size and measures 2.4 cm (2.7 cm previously) previously mentioned hyperdense cyst emanating from the tip of the lower pole of the right kidney is no longer  present and presumably represented a hemorrhagic cyst that has now involuted. The pancreas, gallbladder, spleen, adrenal glands and bowel are normal. No evidence of free air, free fluid or abscess. The bladder is relatively decompressed and shows stable appearance of a right-sided diverticulum. The prostate gland is moderately enlarged. Bony structures show mild progression of lumbar spondylosis since the prior study, particularly at the L5-S1 level. Review of the MIP images confirms the above findings. IMPRESSION: 1. Obstructive uropathy on the left with moderate left hydronephrosis and proximal hydroureter secondary to 2 adjacent small calculi in the mid left ureter measuring 4 mm and 5 mm in respective greatest caliber. 2. Normal vasculature without evidence of aortic aneurysm or dissection. 3. Resolution of high density cyst emanating off the inferior aspect of the right kidney since prior imaging. Other benign-appearing cysts of the liver and right kidney are either smaller or larger, as above. Electronically Signed   By: Irish Lack M.D.   On: 06/02/2015 19:49    Assessment/Plan Principal Problem:   Nephrolithiasis Active Problems:   TIA (transient ischemic attack)   Chronic a-fib ([name])   Lumbago without sciatica   HTN (hypertension)   Long term current use of anticoagulant therapy   Acute on chronic kidney failure-II   Hydronephrosis of left kidney   Kidney stone on left side   Nephrolithiasis and left hydronephrosis: This a recurrent issue. Previously, patient had small kidney stone twice, and was able to pass kidney stones spontaneously. Today, CT angiogram of abdomen/pelvis showed obstructive uropathy on the left with moderate left hydronephrosis and proximal hydroureter secondary to 2 adjacent small calculi in the mid left ureter measuring 4 mm and 5 mm. Pt is nonseptic and has no signs of infection. Neurology was consulted by EDP  -will admit to tele bed (due to hx of A Fib) -prn  Zofran for nausea, and prn Norco and morphine for pain -IVF: NS 500 cc and then 100 cc/h -f/u urology's recommendations  Atrial Fibrillation: CHA2DS2-VASc Score is 5, needs oral anticoagulation. Patient is on Coumadin at home. INR is 2.30 on admission. Heart rate is well controlled. -continue metoprolol and Cardizem  Hx of TIA/Stroke: no new issues today. -will hold coumadin pending neurologist evaluation in case pt needs procedure. If no procedure needed, we restart Coumadin as early as possible  HTN: Blood pressure is elevated 181/99, likely due to pain -On metoprolol and Cardizem -When necessary hydralazine IV  AoCKD-II: Baseline Cre is 1.06, his Cre is 1.50 on admission. Likely due to hydronephrosis. - Follow-up urology's recommendation - IVF as above - Follow up renal function by BMP - Avoid ACEI  and NSAIDs   DVT ppx: SCD  Code Status: Full code Family Communication:  Yes, patient's wife at bed side Disposition Plan: Admit to inpatient   Date of Service 06/02/2015    Lorretta HarpIU, Avira Tillison [facility] Pager [phone]  If 7PM-7AM, please contact night-coverage [email or web address] Password [email or web address] 06/02/2015, 10:04 PM

## 2015-06-02 NOTE — ED Provider Notes (Signed)
CSN: [id number]     Arrival date & time 06/02/15  1516 History   First MD Initiated Contact with Patient 06/02/15 1632     Chief Complaint  Patient presents with  . Flank Pain  . Hematuria     (Consider location/radiation/quality/duration/timing/severity/associated sxs/prior Treatment) HPI 75 year old male who presents with left flank pain and hematuria. History of nephrolithiasis, atrial fibrillation on coumadin, HTN, and CVA. Around lunch time today, developed hematuria and "not feeling right." No blood clots. Developed LLQ abdominal pain shortly afterwards. An hour later, developed left flank pain. Similar to prior history of kidney stones, but has not had issues with this for a very long time and is not seen urology in several years. No nausea or vomiting, chest pain or difficulty breathing, syncope or near syncope. No dysuria, urinary frequency, urinary retention, fevers or chills.  Past Medical History  Diagnosis Date  . TIA (transient ischemic attack)   . Atrial fibrillation ([name])   . Hypertension   . Stroke [facility]([name])     right cerebellar infarct  . Nephrolithiasis   . Chronic back pain   . CKD (chronic kidney disease), stage II    Past Surgical History  Procedure Laterality Date  . Cardiac catheterization    . Cardioversion  11/01/2002    normal L main, normal LAD, L Cfx normal & nondominant; RCA normal & dominant (Dr. Erlene QuanJ. Berry)  . Tonsillectomy    . Transthoracic echocardiogram  04/2012    EF 50-65%, LA mod-severely dilated; RV mildly dilated; RA mod dilated   Family History  Problem Relation Age of Onset  . Heart attack Father   . Coronary artery disease Brother    Social History  Substance Use Topics  . Smoking status: Never Smoker   . Smokeless tobacco: Never Used  . Alcohol Use: No    Review of Systems 10/14 systems reviewed and are negative other than those stated in the HPI   Allergies  Review of patient's allergies indicates no known allergies.  Home  Medications   Prior to Admission medications   Medication Sig Start Date End Date Taking? Authorizing Provider  acetaminophen (TYLENOL) 500 MG tablet Take 500 mg by mouth daily as needed for moderate pain or headache.   Yes Historical Provider, MD  CARTIA XT 120 MG 24 hr capsule TAKE 1 CAPSULE BY MOUTH DAILY. 05/16/15  Yes Chrystie NoseKenneth C Hilty, MD  metoprolol succinate (TOPROL-XL) 25 MG 24 hr tablet Take 1 tablet (25 mg total) by mouth daily. 05/13/14  Yes Chrystie NoseKenneth C Hilty, MD  warfarin (COUMADIN) 5 MG tablet Take 1 to 1.5 tablets by mouth daily as directed by coumadin clinic Patient taking differently: Takes 5mg s daily except on Friday take 2.5mg s 02/17/15  Yes Chrystie NoseKenneth C Hilty, MD   BP 165/91 mmHg  Pulse 82  Temp(Src) 98.5 F (36.9 C) (Oral)  Resp 18  Ht 5\' 8"  (1.727 m)  Wt 200 lb 2.8 oz (90.8 kg)  BMI 30.44 kg/m2  SpO2 98% Physical Exam Physical Exam  Nursing note and vitals reviewed. Constitutional: Well developed, well nourished, non-toxic, and in no acute distress Head: Normocephalic and atraumatic.  Mouth/Throat: Oropharynx is clear and moist.  Neck: Normal range of motion. Neck supple.  Cardiovascular: Normal rate and regular rhythm.   Pulmonary/Chest: Effort normal and breath sounds normal.  Abdominal: Soft. There is LLQ tenderness. There is no rebound and no guarding. Left CVA tenderness. GU: Normal penis  and scrotum. No swelling, overlying skin changes, or tenderness.  Musculoskeletal: Normal range of motion.  Neurological: Alert, no facial droop, fluent speech, moves all extremities symmetrically Skin: Skin is warm and dry.  Psychiatric: Cooperative  ED Course  Procedures (including critical care time) Labs Review Labs Reviewed  URINALYSIS, ROUTINE W REFLEX MICROSCOPIC (NOT AT [facility]) - Abnormal; Notable for the following:    Color, Urine AMBER (*)    APPearance CLOUDY (*)    Hgb urine dipstick LARGE (*)    All other components within normal limits  BASIC METABOLIC PANEL  - Abnormal; Notable for the following:    Glucose, Bld 117 (*)    Creatinine, Ser 1.50 (*)    GFR calc non Af Amer 43 (*)    GFR calc Af Amer 50 (*)    All other components within normal limits  PROTIME-INR - Abnormal; Notable for the following:    Prothrombin Time 25.1 (*)    INR 2.30 (*)    All other components within normal limits  URINE MICROSCOPIC-ADD ON - Abnormal; Notable for the following:    Squamous Epithelial / LPF 0-5 (*)    Bacteria, UA RARE (*)    All other components within normal limits  APTT - Abnormal; Notable for the following:    aPTT 40 (*)    All other components within normal limits  CBC  DIFFERENTIAL  PROTIME-INR    Imaging Review Ct Cta Abd/pel W/cm &/or W/o Cm  06/02/2015  CLINICAL DATA:  Left-sided flank pain and hematuria. EXAM: CTA ABDOMEN AND PELVIS WITHOUT CONTRAST TECHNIQUE: Multidetector CT imaging of the abdomen and pelvis was performed using the standard protocol during bolus administration of intravenous contrast. Multiplanar reconstructed images and MIPs were obtained and reviewed to evaluate the vascular anatomy. CONTRAST:  100 mL Isovue 370 IV COMPARISON:  09/25/2009 CT of abdomen and pelvis at [facility] FINDINGS: Moderate left hydronephrosis and proximal hydroureter present as well as perinephric edema. There are 2 separate adjacent calculi in the mid ureter measuring approximately 4 and 5 mm each in greatest caliber. No other urinary tract calculi identified. CTA demonstrates a normal appearance to the abdominal aorta, iliac arteries and common femoral arteries. No evidence of aneurysmal disease or dissection. Major branch vessels are all normally patent. Posterior right hepatic cyst shows decrease in size since the prior study and measures 2.1 cm (approximately 3 cm previously). Simple cyst of the anterior interpolar right kidney measures 3.7 cm (2.9 cm previously) posterior interpolar right renal cyst shows decrease in size and measures 2.4 cm  (2.7 cm previously) previously mentioned hyperdense cyst emanating from the tip of the lower pole of the right kidney is no longer present and presumably represented a hemorrhagic cyst that has now involuted. The pancreas, gallbladder, spleen, adrenal glands and bowel are normal. No evidence of free air, free fluid or abscess. The bladder is relatively decompressed and shows stable appearance of a right-sided diverticulum. The prostate gland is moderately enlarged. Bony structures show mild progression of lumbar spondylosis since the prior study, particularly at the L5-S1 level. Review of the MIP images confirms the above findings. IMPRESSION: 1. Obstructive uropathy on the left with moderate left hydronephrosis and proximal hydroureter secondary to 2 adjacent small calculi in the mid left ureter measuring 4 mm and 5 mm in respective greatest caliber. 2. Normal vasculature without evidence of aortic aneurysm or dissection. 3. Resolution of high density cyst emanating off the inferior aspect of the right kidney since prior imaging. Other benign-appearing cysts of the liver and right kidney are  either smaller or larger, as above. Electronically Signed   By: Irish Lack M.D.   On: 06/02/2015 19:49   I have personally reviewed and evaluated these images and lab results as part of my medical decision-making.   EKG Interpretation   Date/Time:  Monday June 02 2015 21:40:07 EDT Ventricular Rate:  86 PR Interval:    QRS Duration: 99 QT Interval:  411 QTC Calculation: 492 R Axis:   35 Text Interpretation:  Atrial fibrillation Low voltage, extremity leads  Minimal ST depression, inferior leads Borderline prolonged QT interval H/o  of atrial fibrillation, last in NSR Confirmed by Pressley Tadesse MD, Kalob Bergen ([id number]) on  06/02/2015 9:48:45 PM      MDM   Final diagnoses:  Hematuria  Ureteral stone  Hydronephrosis with urinary obstruction due to renal calculus  Atrial fibrillation, unspecified type ([name])   Anticoagulated on Coumadin   75 year old male Who presents with left flank pain and hematuria. On presentation he does appear uncomfortable secondary to pain,   and hypertensive. Afebrile and otherwise stable vital signs. Abdomen is overall soft and nonsurgical, with some left lower quadrant tenderness to palpation and CVA tenderness. Ua without infection but large amount of blood. Mild AKI, creatinine of 1.5. Likely kidney stone, but age and risk factors also concerning for AAA and no prior CT abd/pelvis to evaluate this in the past. CT abd/pelvis performed. Normal aorta. Evidence of left ureteral stone with hydronephrosis. INR 2.3 on his warfarin, but stable hemoglobin. Had initially received morphine and antibiotics with IV fluids. Minimally improved symptoms, and feels worse pain after Vicodin, and not adequately pain controlled for discharge home. Given ongoing hematuria and poorly controlled pain will admit to hospitalist service for pain controlled and serial hemoglobin. Discussed with Dr. Clyde Lundborg. Requested urology consult. Discussed with Dr. Wilson Singer who will see patient in the AM. Admitted to observation.    Lavera Guise, MD 06/03/15 [phone]

## 2015-06-02 NOTE — ED Notes (Addendum)
Patient's clothing and eyeglasses sent upstairs with him.

## 2015-06-03 DIAGNOSIS — R311 Benign essential microscopic hematuria: Secondary | ICD-10-CM | POA: Diagnosis not present

## 2015-06-03 DIAGNOSIS — N179 Acute kidney failure, unspecified: Secondary | ICD-10-CM

## 2015-06-03 DIAGNOSIS — R1032 Left lower quadrant pain: Secondary | ICD-10-CM | POA: Diagnosis not present

## 2015-06-03 DIAGNOSIS — N200 Calculus of kidney: Secondary | ICD-10-CM | POA: Diagnosis not present

## 2015-06-03 DIAGNOSIS — N189 Chronic kidney disease, unspecified: Secondary | ICD-10-CM

## 2015-06-03 DIAGNOSIS — I482 Chronic atrial fibrillation: Secondary | ICD-10-CM | POA: Diagnosis not present

## 2015-06-03 DIAGNOSIS — I10 Essential (primary) hypertension: Secondary | ICD-10-CM | POA: Diagnosis not present

## 2015-06-03 DIAGNOSIS — N132 Hydronephrosis with renal and ureteral calculous obstruction: Secondary | ICD-10-CM | POA: Diagnosis not present

## 2015-06-03 DIAGNOSIS — N201 Calculus of ureter: Secondary | ICD-10-CM | POA: Diagnosis not present

## 2015-06-03 LAB — PROTIME-INR
INR: 2.36 — AB (ref 0.00–1.49)
PROTHROMBIN TIME: 25.5 s — AB (ref 11.6–15.2)

## 2015-06-03 LAB — GLUCOSE, CAPILLARY: GLUCOSE-CAPILLARY: 124 mg/dL — AB (ref 65–99)

## 2015-06-03 MED ORDER — WARFARIN SODIUM 5 MG PO TABS
5.0000 mg | ORAL_TABLET | Freq: Once | ORAL | Status: AC
  Administered 2015-06-03: 5 mg via ORAL
  Filled 2015-06-03: qty 1

## 2015-06-03 MED ORDER — TAMSULOSIN HCL 0.4 MG PO CAPS
0.4000 mg | ORAL_CAPSULE | Freq: Two times a day (BID) | ORAL | Status: DC
  Administered 2015-06-03 – 2015-06-04 (×3): 0.4 mg via ORAL
  Filled 2015-06-03 (×3): qty 1

## 2015-06-03 MED ORDER — OXYCODONE-ACETAMINOPHEN 7.5-325 MG PO TABS
1.0000 | ORAL_TABLET | ORAL | Status: DC | PRN
  Administered 2015-06-03: 1 via ORAL
  Filled 2015-06-03: qty 1

## 2015-06-03 MED ORDER — WARFARIN - PHARMACIST DOSING INPATIENT: Freq: Every day | Status: DC

## 2015-06-03 MED ORDER — DILTIAZEM HCL ER COATED BEADS 120 MG PO CP24
120.0000 mg | ORAL_CAPSULE | Freq: Every day | ORAL | Status: DC
  Administered 2015-06-04: 120 mg via ORAL
  Filled 2015-06-03: qty 1

## 2015-06-03 NOTE — Progress Notes (Signed)
ANTICOAGULATION CONSULT NOTE - Initial Consult  Pharmacy Consult for warfarin Indication: atrial fibrillation  No Known Allergies  Patient Measurements: Height: 5\' 8"  (172.7 cm) Weight: 200 lb 2.8 oz (90.8 kg) IBW/kg (Calculated) : 68.4 Heparin Dosing Weight:   Vital Signs: Temp: 98.4 F (36.9 C) (04/04 0616) Temp Source: Oral (04/04 0616) BP: 132/78 mmHg (04/04 0616) Pulse Rate: 52 (04/04 0957)  Labs:  Recent Labs  06/02/15 1535 06/02/15 2138 06/03/15 0441  HGB 16.6  --   --   HCT 49.6  --   --   PLT 209  --   --   APTT  --  40*  --   LABPROT 25.1*  --  25.5*  INR 2.30*  --  2.36*  CREATININE 1.50*  --   --     Estimated Creatinine Clearance: 45.9 mL/min (by C-G formula based on Cr of 1.5).   Medical History: Past Medical History  Diagnosis Date  . TIA (transient ischemic attack)   . Atrial fibrillation ([name])   . Hypertension   . Stroke [facility]([name])     right cerebellar infarct  . Nephrolithiasis   . Chronic back pain   . CKD (chronic kidney disease), stage II     Assessment: [age] YOM presented with R flank pain and hematuria. He has h/o stones, found to have 2 small ureteral stones. On chronic warfarin therapy for afib.  INR therapeutic at admission.  No plans currently for surgical intervention. Orders for pharmacy to resume warfarin   Per coumadin clinic notes, home regimen 5mg  daily with 2.5mg  on Fri.  Last dose was 4/2.   Labs, 06/03/2015:  INR therapeutic and trending up despite last dose of warfarin being 4/2  CBC: WNL 4/3 despite hematuria at admit (hematuria resolved this am)  Diet: heart healthy  No major drug-drug interactions  Goal of Therapy:  INR 2-3   Plan:   Continue home warfarin dosing with 5mg  tonight  Daily INR  Juliette Alcideustin Michelene Keniston, PharmD, BCPS.   Pager: [phone] 06/03/2015 10:28 AM

## 2015-06-03 NOTE — Progress Notes (Signed)
Occupational Therapy Evaluation Patient Details Name: Ross Lewis MRN: [id number] DOB: June 10, 1940 Today's Date: 06/03/2015    History of Present Illness Ross Lewis is an 75 y.o. male with PMH of recurrent kidney stone, hypertension, TIA/stroke, atrial fibrillation on Coumadin, CKD-II, chronic back pain, who presents with left-sided flank pain and left lower abdominal pain, hematuria.   Clinical Impression   Patient independent with mobility and ADLs. No OT needs. Will sign off.    Follow Up Recommendations  No OT follow up    Equipment Recommendations  None recommended by OT    Recommendations for Other Services       Precautions / Restrictions Precautions Precautions: None Restrictions Weight Bearing Restrictions: No      Mobility Bed Mobility Overal bed mobility: Independent                Transfers Overall transfer level: Independent Equipment used: None                  Balance                                            ADL Overall ADL's : Independent                                             Vision     Perception     Praxis      Pertinent Vitals/Pain Pain Assessment: 0-10 Pain Score: 2  Pain Location: L flank Pain Descriptors / Indicators: Aching Pain Intervention(s): Monitored during session     Hand Dominance Right   Extremity/Trunk Assessment Upper Extremity Assessment Upper Extremity Assessment: Overall WFL for tasks assessed   Lower Extremity Assessment Lower Extremity Assessment: Overall WFL for tasks assessed   Cervical / Trunk Assessment Cervical / Trunk Assessment: Normal   Communication Communication Communication: No difficulties   Cognition Arousal/Alertness: Awake/alert Behavior During Therapy: WFL for tasks assessed/performed Overall Cognitive Status: Within Functional Limits for tasks assessed                     General Comments        Exercises       Shoulder Instructions      Home Living Family/patient expects to be discharged to:: Private residence Living Arrangements: Spouse/significant other Available Help at Discharge: Family;Available PRN/intermittently Type of Home: House Home Access: Stairs to enter Entergy CorporationEntrance Stairs-Number of Steps: 3 Entrance Stairs-Rails: None (has a post he can reach) Home Layout: One level     Bathroom Shower/Tub: Chief Strategy OfficerTub/shower unit   Bathroom Toilet: Standard Bathroom Accessibility: Yes   Home Equipment: None          Prior Functioning/Environment Level of Independence: Independent             OT Diagnosis: Acute pain   OT Problem List: Pain   OT Treatment/Interventions:      OT Goals(Current goals can be found in the care plan section) Acute Rehab OT Goals Patient Stated Goal: to go home OT Goal Formulation: All assessment and education complete, DC therapy  OT Frequency:     Barriers to D/C:            Co-evaluation  End of Session Nurse Communication: Other (comment) (IV beeping complete)  Activity Tolerance: Patient tolerated treatment well Patient left: in bed;with call bell/phone within reach   Time: 1610-9604 OT Time Calculation (min): 13 min Charges:  OT General Charges $OT Visit: 1 Procedure OT Evaluation $OT Eval Low Complexity: 1 Procedure G-Codes: OT G-codes **NOT FOR INPATIENT CLASS** Functional Assessment Tool Used: clinical judgment Functional Limitation: Self care Self Care Current Status (V4098): 0 percent impaired, limited or restricted Self Care Goal Status (J1914): 0 percent impaired, limited or restricted Self Care Discharge Status (N8295): 0 percent impaired, limited or restricted  Tyanna Hach A 06/03/2015, 9:56 AM

## 2015-06-03 NOTE — Progress Notes (Signed)
[facility] PROGRESS NOTE    Progress Note   Ross Lewis ZOX:[id number] DOB: May 25, 1940 DOA: 06/02/2015 PCP: Pcp Not In System   Brief Narrative:   Ross Lewis is an 75 y.o. male with PMH of recurrent kidney stone, hypertension, TIA/stroke, atrial fibrillation on Coumadin, CKD-II, chronic back pain, who presents with left-sided flank pain and left lower abdominal pain, hematuria.  Assessment/Plan:   Nephrolithiasis with left hydronephrosis: CT of the abdomen showed a left renal calculi measuring 5 mm with some hydronephrosis. Urology was consulted they recommended conservative management as he usually passes the stones. Continue IV fluids and increased narcotics. Discontinue telemetry.  Acute kidney injury: Likely due to obstructive uropathy. Neurology recommended to follow-up with him as an outpatient await NSAIDs and or his inhibitor.  Essential hypertension: Continue current regimen.  Chronic atrial fibrillation CHA2DS2-VASc Score is 5, needs oral anticoagulation. Patient is on Coumadin at home. INR is 2.30   TIA (transient ischemic attack): Resume Coumadin.  Essential  HTN (hypertension) Resume home regimen.  DVT Prophylaxis - cont coumadin  Family Communication: none Disposition Plan: Home in am Code Status:     Code Status Orders        Start     Ordered   06/02/15 2130  Full code   Continuous     06/02/15 2131    Code Status History    Date Active Date Inactive Code Status Order ID Comments User Context   05/02/2012  3:20 AM 05/03/2012  4:34 PM Full Code [id number]  Houston Siren, MD Inpatient    Advance Directive Documentation        Most Recent Value   Type of Advance Directive  Living will   Pre-existing out of facility DNR order (yellow form or pink MOST form)     "MOST" Form in Place?          IV Access:    Peripheral IV   Procedures and diagnostic studies:   Ct Cta Abd/pel W/cm &/or W/o Cm  06/02/2015  CLINICAL DATA:   Left-sided flank pain and hematuria. EXAM: CTA ABDOMEN AND PELVIS WITHOUT CONTRAST TECHNIQUE: Multidetector CT imaging of the abdomen and pelvis was performed using the standard protocol during bolus administration of intravenous contrast. Multiplanar reconstructed images and MIPs were obtained and reviewed to evaluate the vascular anatomy. CONTRAST:  100 mL Isovue 370 IV COMPARISON:  09/25/2009 CT of abdomen and pelvis at [facility] FINDINGS: Moderate left hydronephrosis and proximal hydroureter present as well as perinephric edema. There are 2 separate adjacent calculi in the mid ureter measuring approximately 4 and 5 mm each in greatest caliber. No other urinary tract calculi identified. CTA demonstrates a normal appearance to the abdominal aorta, iliac arteries and common femoral arteries. No evidence of aneurysmal disease or dissection. Major branch vessels are all normally patent. Posterior right hepatic cyst shows decrease in size since the prior study and measures 2.1 cm (approximately 3 cm previously). Simple cyst of the anterior interpolar right kidney measures 3.7 cm (2.9 cm previously) posterior interpolar right renal cyst shows decrease in size and measures 2.4 cm (2.7 cm previously) previously mentioned hyperdense cyst emanating from the tip of the lower pole of the right kidney is no longer present and presumably represented a hemorrhagic cyst that has now involuted. The pancreas, gallbladder, spleen, adrenal glands and bowel are normal. No evidence of free air, free fluid or abscess. The bladder is relatively decompressed and shows stable appearance of a right-sided diverticulum. The prostate gland  is moderately enlarged. Bony structures show mild progression of lumbar spondylosis since the prior study, particularly at the L5-S1 level. Review of the MIP images confirms the above findings. IMPRESSION: 1. Obstructive uropathy on the left with moderate left hydronephrosis and proximal hydroureter  secondary to 2 adjacent small calculi in the mid left ureter measuring 4 mm and 5 mm in respective greatest caliber. 2. Normal vasculature without evidence of aortic aneurysm or dissection. 3. Resolution of high density cyst emanating off the inferior aspect of the right kidney since prior imaging. Other benign-appearing cysts of the liver and right kidney are either smaller or larger, as above. Electronically Signed   By: Irish Lack M.D.   On: 06/02/2015 19:49     Medical Consultants:    None.  Anti-Infectives:   Anti-infectives    None      Subjective:    Ross Lewis patient related his pain is not controlled, he is anorexic.  Objective:    Filed Vitals:   06/02/15 2040 06/02/15 2100 06/02/15 2247 06/03/15 0616  BP: 181/99 184/101 165/91 132/78  Pulse: 77 67 82 63  Temp: 98.3 F (36.8 C)  98.5 F (36.9 C) 98.4 F (36.9 C)  TempSrc: Oral  Oral Oral  Resp: 18   18  Height:    (1.727 m)   Weight:   90.8 kg (200 lb 2.8 oz)   SpO2: 99% 99% 98% 98%    Intake/Output Summary (Last 24 hours) at 06/03/15 0928 Last data filed at 06/02/15 2028  Gross per 24 hour  Intake    500 ml  Output    200 ml  Net    300 ml   Filed Weights   06/02/15 2247  Weight: 90.8 kg (200 lb 2.8 oz)    Exam: Gen:  NAD Cardiovascular:  RRR. Chest and lungs:   CTAB Abdomen:  Abdomen soft, NT/ND, + BS Extremities:  No edema   Data Reviewed:    Labs: Basic Metabolic Panel:  Recent Labs Lab 06/02/15 1535  NA 142  K 4.6  CL 110  CO2 25  GLUCOSE 117*  BUN 17  CREATININE 1.50*  CALCIUM 9.4   GFR Estimated Creatinine Clearance: 45.9 mL/min (by C-G formula based on Cr of 1.5). Liver Function Tests: No results for input(s): AST, ALT, ALKPHOS, BILITOT, PROT, ALBUMIN in the last 168 hours. No results for input(s): LIPASE, AMYLASE in the last 168 hours. No results for input(s): AMMONIA in the last 168 hours. Coagulation profile  Recent Labs Lab 06/02/15 1535  06/03/15 0441  INR 2.30* 2.36*    CBC:  Recent Labs Lab 06/02/15 1535  WBC 9.7  NEUTROABS 5.7  HGB 16.6  HCT 49.6  MCV 97.4  PLT 209   Cardiac Enzymes: No results for input(s): CKTOTAL, CKMB, CKMBINDEX, TROPONINI in the last 168 hours. BNP (last 3 results) No results for input(s): PROBNP in the last 8760 hours. CBG:  Recent Labs Lab 06/03/15 0738  GLUCAP 124*   D-Dimer: No results for input(s): DDIMER in the last 72 hours. Hgb A1c: No results for input(s): HGBA1C in the last 72 hours. Lipid Profile: No results for input(s): CHOL, HDL, LDLCALC, TRIG, CHOLHDL, LDLDIRECT in the last 72 hours. Thyroid function studies: No results for input(s): TSH, T4TOTAL, T3FREE, THYROIDAB in the last 72 hours.  Invalid input(s): FREET3 Anemia work up: No results for input(s): VITAMINB12, FOLATE, FERRITIN, TIBC, IRON, RETICCTPCT in the last 72 hours. Sepsis Labs:  Recent Labs Lab 06/02/15 1535  WBC 9.7   Microbiology No results found for this or any previous visit (from the past 240 hour(s)).   Medications:   . diltiazem  120 mg Oral Daily  . metoprolol succinate  25 mg Oral Daily  . sodium chloride flush  3 mL Intravenous Q12H   Continuous Infusions: . sodium chloride 100 mL/hr at 06/02/15 2311    Time spent: 25 min     FELIZ Rosine BeatORTIZ, Mackenzie Lia  [facility] Pager [phone]  *Please refer to [email or web address], password [email or web address] to get updated schedule on who will round on this patient, as hospitalists switch teams weekly. If 7PM-7AM, please contact night-coverage at [email or web address], password [email or web address] for any overnight needs.  06/03/2015, 9:28 AM

## 2015-06-03 NOTE — Progress Notes (Signed)
PT Cancellation Note  Patient Details Name: Ross Lewis MRN: [id number] DOB: 1940/06/02   Cancelled Treatment:    Reason Eval/Treat Not Completed: PT screened, no needs identified, will sign off RN and patient report no PT needs at this time.     [name],[name] 06/03/2015, 9:06 AM Zenovia JarredKati Orlinda Slomski, PT, DPT 06/03/2015 Pager: [phone]

## 2015-06-03 NOTE — Consult Note (Signed)
Subjective: CC: Left Flank pain.  Hx:  Mr. Majchrzak is a 76yo WM who I was asked to see in consultation by Dr. David Stall for right flank pain and hematuria.   He has a long history of stones and was last seen in our office by Dr. Mena Goes.  His stones have been calcium and he last passed a stone in 2013.  He has not required intervention for prior stones.   He presented yesterday with left flank pain that was severe and was associated with gross hematuria.   He had some burning with the hematuria.   He had no fever.  He is on warfarin with a therapeutic INR.   A CT shows two small stones in the lower portion of the proximal ureter with mild obstruction.  His UA on admission had 6-30 RBC's.   He continues to report hematuria this morning but his pain is improved.   He has had a bump in his Cr to 1.5 from about 1.1.  ROS:  Review of Systems  Constitutional: Negative for fever and chills.  Gastrointestinal: Positive for nausea and abdominal pain.  Genitourinary: Positive for dysuria, hematuria and flank pain.  All other systems reviewed and are negative.   No Known Allergies  Past Medical History  Diagnosis Date  . TIA (transient ischemic attack)   . Atrial fibrillation ([name])   . Hypertension   . Stroke [facility])     right cerebellar infarct  . Nephrolithiasis   . Chronic back pain   . CKD (chronic kidney disease), stage II     Past Surgical History  Procedure Laterality Date  . Cardiac catheterization    . Cardioversion  11/01/2002    normal L main, normal LAD, L Cfx normal & nondominant; RCA normal & dominant (Dr. Erlene Quan)  . Tonsillectomy    . Transthoracic echocardiogram  04/2012    EF 50-65%, LA mod-severely dilated; RV mildly dilated; RA mod dilated    Social History   Social History  . Marital Status: Married    Spouse Name: N/A  . Number of Children: N/A  . Years of Education: N/A   Occupational History  . Not on file.   Social History Main Topics  . Smoking  status: Never Smoker   . Smokeless tobacco: Never Used  . Alcohol Use: No  . Drug Use: No  . Sexual Activity:    Partners: Female   Other Topics Concern  . Not on file   Social History Narrative    Family History  Problem Relation Age of Onset  . Heart attack Father   . Coronary artery disease Brother     Anti-infectives: Anti-infectives    None      Current Facility-Administered Medications  Medication Dose Route Frequency Provider Last Rate Last Dose  . 0.9 %  sodium chloride infusion   Intravenous Continuous Lorretta Harp, MD 100 mL/hr at 06/02/15 2311    . acetaminophen (TYLENOL) tablet 500 mg  500 mg Oral Daily PRN Lorretta Harp, MD      . diltiazem (CARDIZEM CD) 24 hr capsule 120 mg  120 mg Oral Daily Lorretta Harp, MD      . hydrALAZINE (APRESOLINE) injection 5 mg  5 mg Intravenous Q2H PRN Lorretta Harp, MD      . HYDROcodone-acetaminophen (NORCO/VICODIN) 5-325 MG per tablet 1 tablet  1 tablet Oral Q4H PRN Lorretta Harp, MD      . metoprolol succinate (TOPROL-XL) 24 hr tablet 25 mg  25 mg Oral Daily Lorretta Harp, MD      . morphine 2 MG/ML injection 2 mg  2 mg Intravenous Q3H PRN Lorretta Harp, MD      . ondansetron [facility]) injection 4 mg  4 mg Intravenous Q8H PRN Lorretta Harp, MD      . sodium chloride flush (NS) 0.9 % injection 3 mL  3 mL Intravenous Q12H Lorretta Harp, MD   3 mL at 06/02/15 2312   I have reviewed his past, family and social history.   Objective: Vital signs in last 24 hours: Temp:  [97.8 F (36.6 C)-98.5 F (36.9 C)] 98.4 F (36.9 C) (04/04 0616) Pulse Rate:  [55-87] 63 (04/04 0616) Resp:  [16-18] 18 (04/04 0616) BP: (132-190)/(78-118) 132/78 mmHg (04/04 0616) SpO2:  [97 %-100 %] 98 % (04/04 0616) Weight:  [90.8 kg (200 lb 2.8 oz)] 90.8 kg (200 lb 2.8 oz) (04/03 2247)  Intake/Output from previous day: 04/03 0701 - 04/04 0700 In: 500 [IV Piggyback:500] Out: 200 [Urine:200] Intake/Output this shift:     Physical Exam  Constitutional: He is oriented to person,  place, and time and well-developed, well-nourished, and in no distress.  HENT:  Head: Normocephalic and atraumatic.  Neck: Normal range of motion. Neck supple.  Cardiovascular: Normal rate, regular rhythm and normal heart sounds.   Pulmonary/Chest: Effort normal and breath sounds normal. No respiratory distress.  Abdominal: Soft. He exhibits no mass. There is no tenderness.  Musculoskeletal: Normal range of motion. He exhibits no edema or tenderness.  Neurological: He is alert and oriented to person, place, and time.  Skin: Skin is warm and dry.  Psychiatric: Mood and affect normal.  Vitals reviewed.   Lab Results:   Recent Labs  06/02/15 1535  WBC 9.7  HGB 16.6  HCT 49.6  PLT 209   BMET  Recent Labs  06/02/15 1535  NA 142  K 4.6  CL 110  CO2 25  GLUCOSE 117*  BUN 17  CREATININE 1.50*  CALCIUM 9.4   PT/INR  Recent Labs  06/02/15 1535 06/03/15 0441  LABPROT 25.1* 25.5*  INR 2.30* 2.36*   ABG No results for input(s): PHART, HCO3 in the last 72 hours.  Invalid input(s): PCO2, PO2  Studies/Results: Ct Cta Abd/pel W/cm &/or W/o Cm  06/02/2015  CLINICAL DATA:  Left-sided flank pain and hematuria. EXAM: CTA ABDOMEN AND PELVIS WITHOUT CONTRAST TECHNIQUE: Multidetector CT imaging of the abdomen and pelvis was performed using the standard protocol during bolus administration of intravenous contrast. Multiplanar reconstructed images and MIPs were obtained and reviewed to evaluate the vascular anatomy. CONTRAST:  100 mL Isovue 370 IV COMPARISON:  09/25/2009 CT of abdomen and pelvis at [facility] FINDINGS: Moderate left hydronephrosis and proximal hydroureter present as well as perinephric edema. There are 2 separate adjacent calculi in the mid ureter measuring approximately 4 and 5 mm each in greatest caliber. No other urinary tract calculi identified. CTA demonstrates a normal appearance to the abdominal aorta, iliac arteries and common femoral arteries. No evidence of  aneurysmal disease or dissection. Major branch vessels are all normally patent. Posterior right hepatic cyst shows decrease in size since the prior study and measures 2.1 cm (approximately 3 cm previously). Simple cyst of the anterior interpolar right kidney measures 3.7 cm (2.9 cm previously) posterior interpolar right renal cyst shows decrease in size and measures 2.4 cm (2.7 cm previously) previously mentioned hyperdense cyst emanating from the tip of the lower pole of the right kidney is no  longer present and presumably represented a hemorrhagic cyst that has now involuted. The pancreas, gallbladder, spleen, adrenal glands and bowel are normal. No evidence of free air, free fluid or abscess. The bladder is relatively decompressed and shows stable appearance of a right-sided diverticulum. The prostate gland is moderately enlarged. Bony structures show mild progression of lumbar spondylosis since the prior study, particularly at the L5-S1 level. Review of the MIP images confirms the above findings. IMPRESSION: 1. Obstructive uropathy on the left with moderate left hydronephrosis and proximal hydroureter secondary to 2 adjacent small calculi in the mid left ureter measuring 4 mm and 5 mm in respective greatest caliber. 2. Normal vasculature without evidence of aortic aneurysm or dissection. 3. Resolution of high density cyst emanating off the inferior aspect of the right kidney since prior imaging. Other benign-appearing cysts of the liver and right kidney are either smaller or larger, as above. Electronically Signed   By: Irish LackGlenn  Yamagata M.D.   On: 06/02/2015 19:49   Labs and CT films reviewed.   Hospital notes reviewed.  Case discussed with the ER physician.    Assessment: He has 2 small left proximal ureteral stones with hematuria on warfarin and mild ARI.  He has passed all of his prior stones.  Strain urine and check a KUB in the AM if he is still in.   If his pain is controlled, he could be  discharged with urology f/u in 1-2 weeks.  We will follow.     CC: Dr. David StallFeliz Ortiz.     Senai Kingsley J 06/03/2015 [phone]

## 2015-06-04 DIAGNOSIS — Z7901 Long term (current) use of anticoagulants: Secondary | ICD-10-CM

## 2015-06-04 DIAGNOSIS — I482 Chronic atrial fibrillation: Secondary | ICD-10-CM | POA: Diagnosis not present

## 2015-06-04 DIAGNOSIS — N179 Acute kidney failure, unspecified: Secondary | ICD-10-CM | POA: Diagnosis not present

## 2015-06-04 DIAGNOSIS — N132 Hydronephrosis with renal and ureteral calculous obstruction: Secondary | ICD-10-CM | POA: Diagnosis not present

## 2015-06-04 DIAGNOSIS — N200 Calculus of kidney: Secondary | ICD-10-CM | POA: Diagnosis not present

## 2015-06-04 DIAGNOSIS — G459 Transient cerebral ischemic attack, unspecified: Secondary | ICD-10-CM

## 2015-06-04 LAB — PROTIME-INR
INR: 2.59 — AB (ref 0.00–1.49)
Prothrombin Time: 26.6 seconds — ABNORMAL HIGH (ref 11.6–15.2)

## 2015-06-04 LAB — GLUCOSE, CAPILLARY: GLUCOSE-CAPILLARY: 97 mg/dL (ref 65–99)

## 2015-06-04 MED ORDER — TAMSULOSIN HCL 0.4 MG PO CAPS: 0.4000 mg | ORAL_CAPSULE | Freq: Two times a day (BID) | ORAL | Status: DC

## 2015-06-04 MED ORDER — OXYCODONE-ACETAMINOPHEN 7.5-325 MG PO TABS: 1.0000 | ORAL_TABLET | ORAL | Status: DC | PRN

## 2015-06-04 MED ORDER — WARFARIN SODIUM 5 MG PO TABS: 5.0000 mg | ORAL_TABLET | Freq: Once | ORAL | Status: DC

## 2015-06-04 NOTE — Progress Notes (Signed)
Went over all discharge instruction with patient.  Prescriptions and discharge summary given.  All questions answered.  Pt walked out by [employer].

## 2015-06-04 NOTE — Progress Notes (Signed)
ANTICOAGULATION CONSULT NOTE   Pharmacy Consult for warfarin Indication: atrial fibrillation  No Known Allergies  Patient Measurements: Height: 5\' 8"  (172.7 cm) Weight: 200 lb 2.8 oz (90.8 kg) IBW/kg (Calculated) : 68.4 Heparin Dosing Weight:   Vital Signs: Temp: 98 F (36.7 C) (04/05 0433) Temp Source: Oral (04/05 0433) BP: 155/86 mmHg (04/05 0433) Pulse Rate: 74 (04/05 0433)  Labs:  Recent Labs  06/02/15 1535 06/02/15 2138 06/03/15 0441 06/04/15 0428  HGB 16.6  --   --   --   HCT 49.6  --   --   --   PLT 209  --   --   --   APTT  --  40*  --   --   LABPROT 25.1*  --  25.5* 26.6*  INR 2.30*  --  2.36* 2.59*  CREATININE 1.50*  --   --   --     Estimated Creatinine Clearance: 45.9 mL/min (by C-G formula based on Cr of 1.5).   Medical History: Past Medical History  Diagnosis Date  . TIA (transient ischemic attack)   . Atrial fibrillation ([name])   . Hypertension   . Stroke [facility]([name])     right cerebellar infarct  . Nephrolithiasis   . Chronic back pain   . CKD (chronic kidney disease), stage II     Assessment: [age] YOM presented with R flank pain and hematuria. He has h/o stones, found to have 2 small ureteral stones. On chronic warfarin therapy for afib w/ h/o R cerebral infarct.  INR therapeutic at admission.  No plans currently for surgical intervention. Orders for pharmacy to resume warfarin   Per coumadin clinic notes, home regimen 5mg  daily with 2.5mg  on Fri.  Last dose was 4/2.   Labs, 06/04/2015:  INR therapeutic this am (no warfarin x 2 days  CBC: WNL 4/3 despite hematuria at admit (gross hematuria appears resolved)  Diet: heart healthy  No major drug-drug interactions  Goal of Therapy:  INR 2.5-3.5 per warfarin clinic notes (no h/o mechanical valve)   Plan:   Continue home warfarin dosing with 5mg  tonight  Note higher INR goal (as per warfarin clinic)  Daily INR  Likely discharge today  Juliette Alcideustin Zeigler, PharmD, BCPS.   Pager:  [phone] 06/04/2015 7:42 AM

## 2015-06-04 NOTE — Discharge Summary (Signed)
Physician Discharge Summary  Ross BolognaWilliam L Lewis ZOX:[id number] DOB: 13-May-1940 DOA: 06/02/2015  PCP: Pcp Not In System  Admit date: 06/02/2015 Discharge date: 06/04/2015  Time spent: 35 minutes  Recommendations for Outpatient Follow-up:  1. Follow-up with urology in 1 week check a basic metabolic panel.   Discharge Diagnoses:  Principal Problem:   Nephrolithiasis Active Problems:   TIA (transient ischemic attack)   Chronic a-fib ([name])   Lumbago without sciatica   HTN (hypertension)   Long term current use of anticoagulant therapy   Acute on chronic kidney failure-II   Hydronephrosis of left kidney   Kidney stone on left side   Discharge Condition: stable  Diet recommendation: Heart healthy  Filed Weights   06/02/15 2247  Weight: 90.8 kg (200 lb 2.8 oz)    History of present illness:  75 year old with past medical history of recurrent stones, TIA and atrial fibrillation on Coumadin that comes in as he has started having flank pain and hematuria.  Hospital Course:  Nephrolithiasis with left hydronephrosis: A CT scan of the abdomen was done that showed a left renal calculi measuring 5 mm with some hydronephrosis. Urology was consulted and recommended conservative management as he usually passes his stones. He was started on IV fluids on narcotics and spleen was controlled. Follow-up with urology in 1 week.  Acute kidney injury Likely due to obstructive uropathy. Urology was consulted who recommended follow-up with them as an outpatient. I have instructed the patient that he should avoid NSAIDs and ACE inhibitor's. This likely due to obstructive uropathy to have a active urologist office in 1 week.  Essential hypertension: No changes were made.  Chronic atrial fibrillation CHA2DS2-VASc Score is 5.  Procedures:  CT abd and pelvis  Consultations:  Urology  Discharge Exam: Filed Vitals:   06/03/15 2236 06/04/15 0433  BP: 114/58 155/86  Pulse: 68 74  Temp: 98.1 F  (36.7 C) 98 F (36.7 C)  Resp: 18 18    General: A&O x3 Cardiovascular: RR Respiratory: good air movement CTA B/L  Discharge Instructions   Discharge Instructions    Diet - low sodium heart healthy    Complete by:  As directed      Increase activity slowly    Complete by:  As directed           Current Discharge Medication List    START taking these medications   Details  oxyCODONE-acetaminophen (PERCOCET) 7.5-325 MG tablet Take 1 tablet by mouth every 4 (four) hours as needed for moderate pain. Qty: 30 tablet, Refills: 0    tamsulosin (FLOMAX) 0.4 MG CAPS capsule Take 1 capsule (0.4 mg total) by mouth 2 (two) times daily. Qty: 30 capsule, Refills: 0      CONTINUE these medications which have NOT CHANGED   Details  CARTIA XT 120 MG 24 hr capsule TAKE 1 CAPSULE BY MOUTH DAILY. Qty: 90 capsule, Refills: 0    metoprolol succinate (TOPROL-XL) 25 MG 24 hr tablet Take 1 tablet (25 mg total) by mouth daily. Qty: 90 tablet, Refills: 3    warfarin (COUMADIN) 5 MG tablet Take 1 to 1.5 tablets by mouth daily as directed by coumadin clinic Qty: 120 tablet, Refills: 1      STOP taking these medications     acetaminophen (TYLENOL) 500 MG tablet        No Known Allergies    The results of significant diagnostics from this hospitalization (including imaging, microbiology, ancillary and laboratory) are listed below for reference.  Significant Diagnostic Studies: Ct Cta Abd/pel W/cm &/or W/o Cm  06/02/2015  CLINICAL DATA:  Left-sided flank pain and hematuria. EXAM: CTA ABDOMEN AND PELVIS WITHOUT CONTRAST TECHNIQUE: Multidetector CT imaging of the abdomen and pelvis was performed using the standard protocol during bolus administration of intravenous contrast. Multiplanar reconstructed images and MIPs were obtained and reviewed to evaluate the vascular anatomy. CONTRAST:  100 mL Isovue 370 IV COMPARISON:  09/25/2009 CT of abdomen and pelvis at [facility] FINDINGS:  Moderate left hydronephrosis and proximal hydroureter present as well as perinephric edema. There are 2 separate adjacent calculi in the mid ureter measuring approximately 4 and 5 mm each in greatest caliber. No other urinary tract calculi identified. CTA demonstrates a normal appearance to the abdominal aorta, iliac arteries and common femoral arteries. No evidence of aneurysmal disease or dissection. Major branch vessels are all normally patent. Posterior right hepatic cyst shows decrease in size since the prior study and measures 2.1 cm (approximately 3 cm previously). Simple cyst of the anterior interpolar right kidney measures 3.7 cm (2.9 cm previously) posterior interpolar right renal cyst shows decrease in size and measures 2.4 cm (2.7 cm previously) previously mentioned hyperdense cyst emanating from the tip of the lower pole of the right kidney is no longer present and presumably represented a hemorrhagic cyst that has now involuted. The pancreas, gallbladder, spleen, adrenal glands and bowel are normal. No evidence of free air, free fluid or abscess. The bladder is relatively decompressed and shows stable appearance of a right-sided diverticulum. The prostate gland is moderately enlarged. Bony structures show mild progression of lumbar spondylosis since the prior study, particularly at the L5-S1 level. Review of the MIP images confirms the above findings. IMPRESSION: 1. Obstructive uropathy on the left with moderate left hydronephrosis and proximal hydroureter secondary to 2 adjacent small calculi in the mid left ureter measuring 4 mm and 5 mm in respective greatest caliber. 2. Normal vasculature without evidence of aortic aneurysm or dissection. 3. Resolution of high density cyst emanating off the inferior aspect of the right kidney since prior imaging. Other benign-appearing cysts of the liver and right kidney are either smaller or larger, as above. Electronically Signed   By: Irish Lack M.D.   On:  06/02/2015 19:49    Microbiology: No results found for this or any previous visit (from the past 240 hour(s)).   Labs: Basic Metabolic Panel:  Recent Labs Lab 06/02/15 1535  NA 142  K 4.6  CL 110  CO2 25  GLUCOSE 117*  BUN 17  CREATININE 1.50*  CALCIUM 9.4   Liver Function Tests: No results for input(s): AST, ALT, ALKPHOS, BILITOT, PROT, ALBUMIN in the last 168 hours. No results for input(s): LIPASE, AMYLASE in the last 168 hours. No results for input(s): AMMONIA in the last 168 hours. CBC:  Recent Labs Lab 06/02/15 1535  WBC 9.7  NEUTROABS 5.7  HGB 16.6  HCT 49.6  MCV 97.4  PLT 209   Cardiac Enzymes: No results for input(s): CKTOTAL, CKMB, CKMBINDEX, TROPONINI in the last 168 hours. BNP: BNP (last 3 results) No results for input(s): BNP in the last 8760 hours.  ProBNP (last 3 results) No results for input(s): PROBNP in the last 8760 hours.  CBG:  Recent Labs Lab 06/03/15 0738 06/04/15 0727  GLUCAP 124* 97     Signed:  Marinda Elk MD.  [facility] 06/04/2015, 8:44 AM

## 2015-06-11 DIAGNOSIS — Z0000 Encounter for general adult medical examination without abnormal findings: Secondary | ICD-10-CM | POA: Diagnosis not present

## 2015-06-11 DIAGNOSIS — N201 Calculus of ureter: Secondary | ICD-10-CM | POA: Diagnosis not present

## 2015-06-20 ENCOUNTER — Ambulatory Visit (INDEPENDENT_AMBULATORY_CARE_PROVIDER_SITE_OTHER): Payer: Medicare Other | Admitting: Pharmacist Clinician (PhC)/ Clinical Pharmacy Specialist

## 2015-06-20 ENCOUNTER — Encounter: Payer: Self-pay | Admitting: Internal Medicine

## 2015-06-20 ENCOUNTER — Ambulatory Visit (INDEPENDENT_AMBULATORY_CARE_PROVIDER_SITE_OTHER): Payer: Medicare Other | Admitting: Internal Medicine

## 2015-06-20 VITALS — BP 116/78 | HR 68 | Ht 68.0 in | Wt 203.6 lb

## 2015-06-20 DIAGNOSIS — I482 Chronic atrial fibrillation, unspecified: Secondary | ICD-10-CM

## 2015-06-20 DIAGNOSIS — Z5181 Encounter for therapeutic drug level monitoring: Secondary | ICD-10-CM | POA: Diagnosis not present

## 2015-06-20 DIAGNOSIS — I10 Essential (primary) hypertension: Secondary | ICD-10-CM

## 2015-06-20 DIAGNOSIS — Z7901 Long term (current) use of anticoagulants: Secondary | ICD-10-CM

## 2015-06-20 DIAGNOSIS — Z79899 Other long term (current) drug therapy: Secondary | ICD-10-CM | POA: Diagnosis not present

## 2015-06-20 DIAGNOSIS — I4891 Unspecified atrial fibrillation: Secondary | ICD-10-CM | POA: Diagnosis not present

## 2015-06-20 LAB — POCT INR: INR: 3.4

## 2015-06-20 MED ORDER — DILTIAZEM HCL ER COATED BEADS 120 MG PO CP24: 120.0000 mg | ORAL_CAPSULE | Freq: Every day | ORAL | Status: DC

## 2015-06-20 MED ORDER — METOPROLOL SUCCINATE ER 25 MG PO TB24: 25.0000 mg | ORAL_TABLET | Freq: Every day | ORAL | Status: DC

## 2015-06-20 NOTE — Progress Notes (Signed)
OFFICE NOTE  Chief Complaint:  Routine follow-up, recent kidney stone  Primary Care Physician: Pamelia Hoit, MD  HPI:  Ross Lewis  is a 75 year old gentleman, who has a history of paroxysmal atrial fibrillation and a prior right cerebellar infarct. He has been on chronic Coumadin therapy. Recently, he has been in sinus rhythm. He had had headaches recently and there was concern about a possible stroke. He then presented to the hospital with an episode of foot drag on the right foot and some heaviness and was found to have a new TIA. This resolved fairly quickly without any further treatments; however, he was seen by neuro stroke service, Dr. Pearlean Brownie and recommended that he switch from Coumadin to Xarelto and was subsequently discharged. Since going home, he has felt fairly well; however, has had some problems with bradycardia. His Toprol XL was decreased to 25 mg daily during the hospitalization. He was maintained on Cardizem and as mentioned has switched to Xarelto. His main concern today is the cost of the Xarelto, which is about $322 a month since he does not have any supplemental drug coverage. But, otherwise, he has had no chest pain symptoms or worsening new stroke symptoms, shortness of breath, palpitations, presyncope, or syncopal symptoms.   I saw Ross Lewis back in the office today. He is maintained on warfarin due to cost issues but seems to be well-controlled. His last 2 INRs were therapeutic. He's had no bleeding complications. He maintains a heart rate in the 50s after decreasing his diltiazem at his last visit. He is asymptomatic with this and reports a heart rate increase with exercise. Generally he feels well. He denies any chest pain or shortness of breath. He is currently in atrial fibrillation with a slow ventricular response.  Ross Lewis returns today for follow-up. Unfortunately he was recently hospitalized for kidney stone. During that hospitalization he was noted  be tachycardic and hypertensive, mostly related to pain. This is resolved and his blood pressure is well-controlled today. Heart rates in the 60s. His rhythm is irregular on exam. He likely has chronic if not permanent A. fib at this point. Due to cost issues he prefers to stay on warfarin. His INR today was elevated at 3.2 and we will make adjustments to his dosing. He denies any bleeding problems, specifically any hematuria.  PMHx:  Past Medical History  Diagnosis Date  . TIA (transient ischemic attack)   . Atrial fibrillation ([name])   . Hypertension   . Stroke [facility])     right cerebellar infarct  . Nephrolithiasis   . Chronic back pain   . CKD (chronic kidney disease), stage II     Past Surgical History  Procedure Laterality Date  . Cardiac catheterization    . Cardioversion  11/01/2002    normal L main, normal LAD, L Cfx normal & nondominant; RCA normal & dominant (Dr. Erlene Quan)  . Tonsillectomy    . Transthoracic echocardiogram  04/2012    EF 50-65%, LA mod-severely dilated; RV mildly dilated; RA mod dilated    FAMHx:  Family History  Problem Relation Age of Onset  . Heart attack Father   . Coronary artery disease Brother     SOCHx:   reports that he has never smoked. He has never used smokeless tobacco. He reports that he does not drink alcohol or use illicit drugs.  ALLERGIES:  No Known Allergies  ROS: A comprehensive review of systems was negative.  HOME MEDS: Current Outpatient Prescriptions  Medication Sig Dispense Refill  . diltiazem (CARTIA XT) 120 MG 24 hr capsule Take 1 capsule (120 mg total) by mouth daily. 90 capsule 3  . metoprolol succinate (TOPROL-XL) 25 MG 24 hr tablet Take 1 tablet (25 mg total) by mouth daily. 90 tablet 3  . tamsulosin (FLOMAX) 0.4 MG CAPS capsule Take 1 capsule (0.4 mg total) by mouth 2 (two) times daily. 30 capsule 0  . warfarin (COUMADIN) 5 MG tablet Take 1 to 1.5 tablets by mouth daily as directed by coumadin clinic (Patient taking  differently: Takes 5mg s daily except on Friday take 2.5mg s) 120 tablet 1   No current facility-administered medications for this visit.    LABS/IMAGING: Results for orders placed or performed in visit on 06/20/15 (from the past 48 hour(s))  POCT INR     Status: None   Collection Time: 06/20/15  8:04 AM  Result Value Ref Range   INR 3.4    No results found.  VITALS: BP 116/78 mmHg  Pulse 68  Ht 5\' 8"  (1.727 m)  Wt 203 lb 9.6 oz (92.352 kg)  BMI 30.96 kg/m2  EXAM: General appearance: alert and no distress Neck: no carotid bruit and no JVD Lungs: clear to auscultation bilaterally Heart: irregularly irregular rhythm Abdomen: soft, non-tender; bowel sounds normal; no masses,  no organomegaly Extremities: extremities normal, atraumatic, no cyanosis or edema Pulses: 2+ and symmetric Skin: Skin color, texture, turgor normal. No rashes or lesions Neurologic: Grossly normal PSych: Mood, affect normal  EKG: Deferred  ASSESSMENT: 1. Persistent if not permanent a-fib on warfarin (CHADSVASC 5) 2. History of stroke and TIA 3. Hypertension  PLAN: 1.   [name] is doing much better although was hospitalized recently for kidney stones. Heart rate is well-controlled. Blood pressure is at goal. He is mildly supratherapeutic for his INR that will be adjusted today. Otherwise he's doing well. Follow-up annually or sooner as necessary.  Chrystie NoseKenneth C. Hilty, MD, [facility] Hospital[facility]ACC Attending Cardiologist [facility]  Chrystie NoseKenneth C Hilty 06/20/2015, 8:32 AM

## 2015-06-20 NOTE — Patient Instructions (Signed)
Your physician wants you to follow-up in: 1 year with Dr. Hilty. You will receive a reminder letter in the mail two months in advance. If you don't receive a letter, please call our office to schedule the follow-up appointment.  

## 2015-06-21 ENCOUNTER — Other Ambulatory Visit: Payer: Self-pay | Admitting: Urology

## 2015-06-21 MED ORDER — TAMSULOSIN HCL 0.4 MG PO CAPS: 0.4000 mg | ORAL_CAPSULE | Freq: Every evening | ORAL | Status: AC

## 2015-07-01 DIAGNOSIS — H52203 Unspecified astigmatism, bilateral: Secondary | ICD-10-CM | POA: Diagnosis not present

## 2015-07-01 DIAGNOSIS — H524 Presbyopia: Secondary | ICD-10-CM | POA: Diagnosis not present

## 2015-07-01 DIAGNOSIS — H2513 Age-related nuclear cataract, bilateral: Secondary | ICD-10-CM | POA: Diagnosis not present

## 2015-07-09 DIAGNOSIS — N138 Other obstructive and reflux uropathy: Secondary | ICD-10-CM | POA: Diagnosis not present

## 2015-07-09 DIAGNOSIS — N201 Calculus of ureter: Secondary | ICD-10-CM | POA: Diagnosis not present

## 2015-07-09 DIAGNOSIS — N323 Diverticulum of bladder: Secondary | ICD-10-CM | POA: Diagnosis not present

## 2015-07-09 DIAGNOSIS — N401 Enlarged prostate with lower urinary tract symptoms: Secondary | ICD-10-CM | POA: Diagnosis not present

## 2015-07-09 DIAGNOSIS — Z0000 Encounter for general adult medical examination without abnormal findings: Secondary | ICD-10-CM | POA: Diagnosis not present

## 2015-07-11 ENCOUNTER — Ambulatory Visit (INDEPENDENT_AMBULATORY_CARE_PROVIDER_SITE_OTHER): Payer: Medicare Other | Admitting: Pharmacist

## 2015-07-11 VITALS — BP 144/88

## 2015-07-11 DIAGNOSIS — I482 Chronic atrial fibrillation, unspecified: Secondary | ICD-10-CM

## 2015-07-11 DIAGNOSIS — Z7901 Long term (current) use of anticoagulants: Secondary | ICD-10-CM | POA: Diagnosis not present

## 2015-07-11 DIAGNOSIS — I4891 Unspecified atrial fibrillation: Secondary | ICD-10-CM

## 2015-07-11 DIAGNOSIS — Z79899 Other long term (current) drug therapy: Secondary | ICD-10-CM | POA: Diagnosis not present

## 2015-07-11 LAB — POCT INR: INR: 2.7

## 2015-08-04 ENCOUNTER — Other Ambulatory Visit: Payer: Self-pay | Admitting: Internal Medicine

## 2015-08-08 ENCOUNTER — Ambulatory Visit (INDEPENDENT_AMBULATORY_CARE_PROVIDER_SITE_OTHER): Payer: Medicare Other | Admitting: Pharmacist

## 2015-08-08 VITALS — BP 132/72

## 2015-08-08 DIAGNOSIS — I482 Chronic atrial fibrillation, unspecified: Secondary | ICD-10-CM

## 2015-08-08 DIAGNOSIS — Z79899 Other long term (current) drug therapy: Secondary | ICD-10-CM | POA: Diagnosis not present

## 2015-08-08 DIAGNOSIS — I4891 Unspecified atrial fibrillation: Secondary | ICD-10-CM

## 2015-08-08 DIAGNOSIS — Z7901 Long term (current) use of anticoagulants: Secondary | ICD-10-CM

## 2015-08-08 LAB — POCT INR: INR: 2.6

## 2015-09-05 ENCOUNTER — Ambulatory Visit (INDEPENDENT_AMBULATORY_CARE_PROVIDER_SITE_OTHER): Payer: Medicare Other | Admitting: Pharmacist

## 2015-09-05 DIAGNOSIS — I482 Chronic atrial fibrillation, unspecified: Secondary | ICD-10-CM

## 2015-09-05 DIAGNOSIS — Z7901 Long term (current) use of anticoagulants: Secondary | ICD-10-CM | POA: Diagnosis not present

## 2015-09-05 DIAGNOSIS — I4891 Unspecified atrial fibrillation: Secondary | ICD-10-CM

## 2015-09-05 DIAGNOSIS — Z79899 Other long term (current) drug therapy: Secondary | ICD-10-CM

## 2015-09-05 LAB — POCT INR: INR: 2.6

## 2015-10-03 ENCOUNTER — Ambulatory Visit (INDEPENDENT_AMBULATORY_CARE_PROVIDER_SITE_OTHER): Payer: Medicare Other | Admitting: Pharmacist

## 2015-10-03 DIAGNOSIS — I482 Chronic atrial fibrillation, unspecified: Secondary | ICD-10-CM

## 2015-10-03 DIAGNOSIS — I4891 Unspecified atrial fibrillation: Secondary | ICD-10-CM

## 2015-10-03 DIAGNOSIS — Z79899 Other long term (current) drug therapy: Secondary | ICD-10-CM | POA: Diagnosis not present

## 2015-10-03 DIAGNOSIS — Z7901 Long term (current) use of anticoagulants: Secondary | ICD-10-CM

## 2015-10-03 LAB — POCT INR: INR: 3

## 2015-11-14 ENCOUNTER — Ambulatory Visit (INDEPENDENT_AMBULATORY_CARE_PROVIDER_SITE_OTHER): Payer: Medicare Other | Admitting: Pharmacist Clinician (PhC)/ Clinical Pharmacy Specialist

## 2015-11-14 DIAGNOSIS — I4891 Unspecified atrial fibrillation: Secondary | ICD-10-CM | POA: Diagnosis not present

## 2015-11-14 DIAGNOSIS — I482 Chronic atrial fibrillation, unspecified: Secondary | ICD-10-CM

## 2015-11-14 DIAGNOSIS — Z7901 Long term (current) use of anticoagulants: Secondary | ICD-10-CM | POA: Diagnosis not present

## 2015-11-14 DIAGNOSIS — Z79899 Other long term (current) drug therapy: Secondary | ICD-10-CM | POA: Diagnosis not present

## 2015-11-14 LAB — POCT INR: INR: 1.9

## 2015-11-14 MED ORDER — WARFARIN SODIUM 5 MG PO TABS: ORAL_TABLET | ORAL | 1 refills | Status: DC

## 2015-12-26 ENCOUNTER — Ambulatory Visit (INDEPENDENT_AMBULATORY_CARE_PROVIDER_SITE_OTHER): Payer: Medicare Other | Admitting: Pharmacist Clinician (PhC)/ Clinical Pharmacy Specialist

## 2015-12-26 DIAGNOSIS — Z79899 Other long term (current) drug therapy: Secondary | ICD-10-CM

## 2015-12-26 DIAGNOSIS — I4891 Unspecified atrial fibrillation: Secondary | ICD-10-CM

## 2015-12-26 DIAGNOSIS — I482 Chronic atrial fibrillation, unspecified: Secondary | ICD-10-CM

## 2015-12-26 DIAGNOSIS — Z7901 Long term (current) use of anticoagulants: Secondary | ICD-10-CM

## 2015-12-26 LAB — POCT INR: INR: 2.7

## 2016-02-06 ENCOUNTER — Ambulatory Visit (INDEPENDENT_AMBULATORY_CARE_PROVIDER_SITE_OTHER): Payer: Medicare Other | Admitting: Pharmacist

## 2016-02-06 DIAGNOSIS — Z79899 Other long term (current) drug therapy: Secondary | ICD-10-CM

## 2016-02-06 DIAGNOSIS — I482 Chronic atrial fibrillation, unspecified: Secondary | ICD-10-CM

## 2016-02-06 DIAGNOSIS — I4891 Unspecified atrial fibrillation: Secondary | ICD-10-CM

## 2016-02-06 DIAGNOSIS — Z7901 Long term (current) use of anticoagulants: Secondary | ICD-10-CM

## 2016-02-06 LAB — POCT INR: INR: 2.6

## 2016-03-22 ENCOUNTER — Ambulatory Visit (INDEPENDENT_AMBULATORY_CARE_PROVIDER_SITE_OTHER): Payer: Medicare Other | Admitting: Pharmacist

## 2016-03-22 DIAGNOSIS — I482 Chronic atrial fibrillation, unspecified: Secondary | ICD-10-CM

## 2016-03-22 DIAGNOSIS — I4891 Unspecified atrial fibrillation: Secondary | ICD-10-CM

## 2016-03-22 DIAGNOSIS — Z79899 Other long term (current) drug therapy: Secondary | ICD-10-CM

## 2016-03-22 DIAGNOSIS — Z7901 Long term (current) use of anticoagulants: Secondary | ICD-10-CM | POA: Diagnosis not present

## 2016-03-22 LAB — POCT INR: INR: 2.3

## 2016-04-28 ENCOUNTER — Ambulatory Visit (INDEPENDENT_AMBULATORY_CARE_PROVIDER_SITE_OTHER): Payer: Medicare Other | Admitting: Pharmacist

## 2016-04-28 DIAGNOSIS — I4891 Unspecified atrial fibrillation: Secondary | ICD-10-CM | POA: Diagnosis not present

## 2016-04-28 DIAGNOSIS — Z7901 Long term (current) use of anticoagulants: Secondary | ICD-10-CM | POA: Diagnosis not present

## 2016-04-28 DIAGNOSIS — I482 Chronic atrial fibrillation, unspecified: Secondary | ICD-10-CM

## 2016-04-28 DIAGNOSIS — Z79899 Other long term (current) drug therapy: Secondary | ICD-10-CM | POA: Diagnosis not present

## 2016-04-28 LAB — POCT INR: INR: 2.3

## 2016-05-26 ENCOUNTER — Ambulatory Visit (INDEPENDENT_AMBULATORY_CARE_PROVIDER_SITE_OTHER): Payer: Medicare Other | Admitting: Pharmacist

## 2016-05-26 DIAGNOSIS — I482 Chronic atrial fibrillation, unspecified: Secondary | ICD-10-CM

## 2016-05-26 DIAGNOSIS — Z79899 Other long term (current) drug therapy: Secondary | ICD-10-CM | POA: Diagnosis not present

## 2016-05-26 DIAGNOSIS — Z7901 Long term (current) use of anticoagulants: Secondary | ICD-10-CM

## 2016-05-26 DIAGNOSIS — I4891 Unspecified atrial fibrillation: Secondary | ICD-10-CM | POA: Diagnosis not present

## 2016-05-26 LAB — POCT INR: INR: 2.3

## 2016-06-11 ENCOUNTER — Ambulatory Visit (INDEPENDENT_AMBULATORY_CARE_PROVIDER_SITE_OTHER): Payer: Medicare Other | Admitting: Internal Medicine

## 2016-06-11 ENCOUNTER — Encounter: Payer: Self-pay | Admitting: Internal Medicine

## 2016-06-11 ENCOUNTER — Ambulatory Visit (INDEPENDENT_AMBULATORY_CARE_PROVIDER_SITE_OTHER): Payer: Medicare Other | Admitting: Pharmacist Clinician (PhC)/ Clinical Pharmacy Specialist

## 2016-06-11 VITALS — BP 124/82 | HR 63 | Ht 68.0 in | Wt 200.8 lb

## 2016-06-11 DIAGNOSIS — I10 Essential (primary) hypertension: Secondary | ICD-10-CM

## 2016-06-11 DIAGNOSIS — Z79899 Other long term (current) drug therapy: Secondary | ICD-10-CM

## 2016-06-11 DIAGNOSIS — I482 Chronic atrial fibrillation, unspecified: Secondary | ICD-10-CM

## 2016-06-11 DIAGNOSIS — I4891 Unspecified atrial fibrillation: Secondary | ICD-10-CM

## 2016-06-11 DIAGNOSIS — Z7901 Long term (current) use of anticoagulants: Secondary | ICD-10-CM

## 2016-06-11 LAB — POCT INR: INR: 2.7

## 2016-06-11 MED ORDER — WARFARIN SODIUM 5 MG PO TABS: ORAL_TABLET | ORAL | 1 refills | Status: DC

## 2016-06-11 MED ORDER — DILTIAZEM HCL ER COATED BEADS 120 MG PO CP24: 120.0000 mg | ORAL_CAPSULE | Freq: Every day | ORAL | 3 refills | Status: DC

## 2016-06-11 MED ORDER — METOPROLOL SUCCINATE ER 25 MG PO TB24: 25.0000 mg | ORAL_TABLET | Freq: Every day | ORAL | 3 refills | Status: DC

## 2016-06-11 NOTE — Patient Instructions (Signed)
Your physician recommends that you schedule a follow-up appointment in: 1 year with Dr. Hilty 

## 2016-06-11 NOTE — Addendum Note (Signed)
Addended by: Ladell Heads on: 06/11/2016 08:23 AM   Modules accepted: Orders

## 2016-06-11 NOTE — Progress Notes (Signed)
OFFICE NOTE  Chief Complaint:  No complaints  Primary Care Physician: Pamelia Hoit, MD  HPI:  Ross Lewis  is a 76 year old gentleman, who has a history of paroxysmal atrial fibrillation and a prior right cerebellar infarct. He has been on chronic Coumadin therapy. Recently, he has been in sinus rhythm. He had had headaches recently and there was concern about a possible stroke. He then presented to the hospital with an episode of foot drag on the right foot and some heaviness and was found to have a new TIA. This resolved fairly quickly without any further treatments; however, he was seen by neuro stroke service, Dr. Pearlean Brownie and recommended that he switch from Coumadin to Xarelto and was subsequently discharged. Since going home, he has felt fairly well; however, has had some problems with bradycardia. His Toprol XL was decreased to 25 mg daily during the hospitalization. He was maintained on Cardizem and as mentioned has switched to Xarelto. His main concern today is the cost of the Xarelto, which is about $322 a month since he does not have any supplemental drug coverage. But, otherwise, he has had no chest pain symptoms or worsening new stroke symptoms, shortness of breath, palpitations, presyncope, or syncopal symptoms.   I saw Ross Lewis back in the office today. He is maintained on warfarin due to cost issues but seems to be well-controlled. His last 2 INRs were therapeutic. He's had no bleeding complications. He maintains a heart rate in the 50s after decreasing his diltiazem at his last visit. He is asymptomatic with this and reports a heart rate increase with exercise. Generally he feels well. He denies any chest pain or shortness of breath. He is currently in atrial fibrillation with a slow ventricular response.  Ross Lewis returns today for follow-up. Unfortunately he was recently hospitalized for kidney stone. During that hospitalization he was noted be tachycardic and  hypertensive, mostly related to pain. This is resolved and his blood pressure is well-controlled today. Heart rates in the 60s. His rhythm is irregular on exam. He likely has chronic if not permanent A. fib at this point. Due to cost issues he prefers to stay on warfarin. His INR today was elevated at 3.2 and we will make adjustments to his dosing. He denies any bleeding problems, specifically any hematuria.  06/11/2016  Ross Lewis returns today for follow-up. Overall he is doing well. He's had no recurrent kidney stones. Blood pressure well-controlled today. His INR has been therapeutic at 2.7 today. He prefers to stay on warfarin due to cost issues. He said no bleeding problems. At this point is in permanent atrial fibrillation.  PMHx:  Past Medical History:  Diagnosis Date  . Atrial fibrillation ([name])   . Chronic back pain   . CKD (chronic kidney disease), stage II   . Hypertension   . Nephrolithiasis   . Stroke [facility])    right cerebellar infarct  . TIA (transient ischemic attack)     Past Surgical History:  Procedure Laterality Date  . CARDIAC CATHETERIZATION    . CARDIOVERSION  11/01/2002   normal L main, normal LAD, L Cfx normal & nondominant; RCA normal & dominant (Dr. Erlene Quan)  . TONSILLECTOMY    . TRANSTHORACIC ECHOCARDIOGRAM  04/2012   EF 50-65%, LA mod-severely dilated; RV mildly dilated; RA mod dilated    FAMHx:  Family History  Problem Relation Age of Onset  . Heart attack Father   . Coronary artery disease Brother  SOCHx:   reports that he has never smoked. He has never used smokeless tobacco. He reports that he does not drink alcohol or use drugs.  ALLERGIES:  No Known Allergies  ROS: Pertinent items noted in HPI and remainder of comprehensive ROS otherwise negative.  HOME MEDS: Current Outpatient Prescriptions  Medication Sig Dispense Refill  . diltiazem (CARTIA XT) 120 MG 24 hr capsule Take 1 capsule (120 mg total) by mouth daily. 90 capsule 3  .  metoprolol succinate (TOPROL-XL) 25 MG 24 hr tablet Take 1 tablet (25 mg total) by mouth daily. 90 tablet 3  . tamsulosin (FLOMAX) 0.4 MG CAPS capsule Take 1 capsule (0.4 mg total) by mouth 2 (two) times daily. 30 capsule 0  . tamsulosin (FLOMAX) 0.4 MG CAPS capsule Take 1 capsule (0.4 mg total) by mouth every evening. 30 capsule 0  . warfarin (COUMADIN) 5 MG tablet Take 1 to 1.5 tablets by mouth daily or as directed by coumadin clinic 120 tablet 1   No current facility-administered medications for this visit.     LABS/IMAGING: Results for orders placed or performed in visit on 06/11/16 (from the past 48 hour(s))  POCT INR     Status: None   Collection Time: 06/11/16  7:53 AM  Result Value Ref Range   INR 2.7    No results found.  VITALS: BP 124/82 (BP Location: Left Arm, Patient Position: Sitting, Cuff Size: Large)   Pulse 63   Ht  (1.727 m)   Wt 200 lb 12.8 oz (91.1 kg)   BMI 30.53 kg/m   EXAM: General appearance: alert and no distress Neck: no carotid bruit and no JVD Lungs: clear to auscultation bilaterally Heart: irregularly irregular rhythm Abdomen: soft, non-tender; bowel sounds normal; no masses,  no organomegaly Extremities: extremities normal, atraumatic, no cyanosis or edema Pulses: 2+ and symmetric Skin: Skin color, texture, turgor normal. No rashes or lesions Neurologic: Grossly normal PSych: Mood, affect normal  EKG: Atrial fibrillation with PVCs at 63  ASSESSMENT: 1. Permanent a-fib on warfarin (CHADSVASC 5) 2. History of stroke and TIA 3. Hypertension 4. Kidney stones  PLAN: 1.   Mr. Hallisey is doing well on warfarin without any recurrent stroke or TIA. He has been generally therapeutic with his INRs. He is in permanent A. fib now with good rate control. Blood pressures ago. Has not suffered from any recurrent kidney stones remains on low-dose Flomax per follow-up with me annually or sooner as necessary.  Chrystie Nose, MD, [facility] Attending  Cardiologist [facility]  Chrystie Nose 06/11/2016, 8:18 AM

## 2016-07-09 ENCOUNTER — Ambulatory Visit (INDEPENDENT_AMBULATORY_CARE_PROVIDER_SITE_OTHER): Payer: Medicare Other | Admitting: Pharmacist Clinician (PhC)/ Clinical Pharmacy Specialist

## 2016-07-09 DIAGNOSIS — I482 Chronic atrial fibrillation, unspecified: Secondary | ICD-10-CM

## 2016-07-09 DIAGNOSIS — Z7901 Long term (current) use of anticoagulants: Secondary | ICD-10-CM | POA: Diagnosis not present

## 2016-07-09 DIAGNOSIS — Z79899 Other long term (current) drug therapy: Secondary | ICD-10-CM | POA: Diagnosis not present

## 2016-07-09 DIAGNOSIS — I4891 Unspecified atrial fibrillation: Secondary | ICD-10-CM

## 2016-07-09 LAB — POCT INR: INR: 3.4

## 2016-07-27 DIAGNOSIS — N281 Cyst of kidney, acquired: Secondary | ICD-10-CM | POA: Diagnosis not present

## 2016-07-27 DIAGNOSIS — N200 Calculus of kidney: Secondary | ICD-10-CM | POA: Diagnosis not present

## 2016-08-06 ENCOUNTER — Ambulatory Visit (INDEPENDENT_AMBULATORY_CARE_PROVIDER_SITE_OTHER): Payer: Medicare Other | Admitting: Pharmacist

## 2016-08-06 DIAGNOSIS — I482 Chronic atrial fibrillation, unspecified: Secondary | ICD-10-CM

## 2016-08-06 DIAGNOSIS — I4891 Unspecified atrial fibrillation: Secondary | ICD-10-CM

## 2016-08-06 DIAGNOSIS — Z79899 Other long term (current) drug therapy: Secondary | ICD-10-CM | POA: Diagnosis not present

## 2016-08-06 DIAGNOSIS — Z7901 Long term (current) use of anticoagulants: Secondary | ICD-10-CM

## 2016-08-06 LAB — POCT INR: INR: 2.9

## 2016-08-11 DIAGNOSIS — S61213A Laceration without foreign body of left middle finger without damage to nail, initial encounter: Secondary | ICD-10-CM | POA: Diagnosis not present

## 2016-08-11 DIAGNOSIS — Z23 Encounter for immunization: Secondary | ICD-10-CM | POA: Diagnosis not present

## 2016-09-17 ENCOUNTER — Ambulatory Visit (INDEPENDENT_AMBULATORY_CARE_PROVIDER_SITE_OTHER): Payer: Medicare Other | Admitting: Pharmacist

## 2016-09-17 DIAGNOSIS — I4891 Unspecified atrial fibrillation: Secondary | ICD-10-CM | POA: Diagnosis not present

## 2016-09-17 DIAGNOSIS — Z79899 Other long term (current) drug therapy: Secondary | ICD-10-CM

## 2016-09-17 DIAGNOSIS — I482 Chronic atrial fibrillation, unspecified: Secondary | ICD-10-CM

## 2016-09-17 DIAGNOSIS — Z7901 Long term (current) use of anticoagulants: Secondary | ICD-10-CM

## 2016-09-17 LAB — POCT INR: INR: 3

## 2016-10-29 ENCOUNTER — Ambulatory Visit (INDEPENDENT_AMBULATORY_CARE_PROVIDER_SITE_OTHER): Payer: Medicare Other | Admitting: Pharmacist

## 2016-10-29 DIAGNOSIS — I482 Chronic atrial fibrillation, unspecified: Secondary | ICD-10-CM

## 2016-10-29 DIAGNOSIS — Z7901 Long term (current) use of anticoagulants: Secondary | ICD-10-CM | POA: Diagnosis not present

## 2016-10-29 DIAGNOSIS — Z79899 Other long term (current) drug therapy: Secondary | ICD-10-CM

## 2016-10-29 DIAGNOSIS — I4891 Unspecified atrial fibrillation: Secondary | ICD-10-CM | POA: Diagnosis not present

## 2016-10-29 LAB — POCT INR: INR: 4.1

## 2016-12-03 ENCOUNTER — Ambulatory Visit (INDEPENDENT_AMBULATORY_CARE_PROVIDER_SITE_OTHER): Payer: Medicare Other | Admitting: Pharmacist

## 2016-12-03 DIAGNOSIS — Z7901 Long term (current) use of anticoagulants: Secondary | ICD-10-CM

## 2016-12-03 DIAGNOSIS — Z79899 Other long term (current) drug therapy: Secondary | ICD-10-CM

## 2016-12-03 DIAGNOSIS — I482 Chronic atrial fibrillation, unspecified: Secondary | ICD-10-CM

## 2016-12-03 DIAGNOSIS — I4891 Unspecified atrial fibrillation: Secondary | ICD-10-CM | POA: Diagnosis not present

## 2016-12-03 LAB — POCT INR: INR: 2.9

## 2017-01-17 ENCOUNTER — Ambulatory Visit (INDEPENDENT_AMBULATORY_CARE_PROVIDER_SITE_OTHER): Payer: Medicare Other | Admitting: Pharmacist Clinician (PhC)/ Clinical Pharmacy Specialist

## 2017-01-17 DIAGNOSIS — Z7901 Long term (current) use of anticoagulants: Secondary | ICD-10-CM

## 2017-01-17 DIAGNOSIS — Z79899 Other long term (current) drug therapy: Secondary | ICD-10-CM | POA: Diagnosis not present

## 2017-01-17 DIAGNOSIS — Z5181 Encounter for therapeutic drug level monitoring: Secondary | ICD-10-CM | POA: Diagnosis not present

## 2017-01-17 DIAGNOSIS — I4891 Unspecified atrial fibrillation: Secondary | ICD-10-CM | POA: Diagnosis not present

## 2017-01-17 DIAGNOSIS — I482 Chronic atrial fibrillation, unspecified: Secondary | ICD-10-CM

## 2017-01-17 LAB — POCT INR: INR: 2.7

## 2017-01-17 MED ORDER — WARFARIN SODIUM 5 MG PO TABS: ORAL_TABLET | ORAL | 1 refills | Status: DC

## 2017-01-31 DIAGNOSIS — R351 Nocturia: Secondary | ICD-10-CM | POA: Diagnosis not present

## 2017-01-31 DIAGNOSIS — R972 Elevated prostate specific antigen [PSA]: Secondary | ICD-10-CM | POA: Diagnosis not present

## 2017-01-31 DIAGNOSIS — N401 Enlarged prostate with lower urinary tract symptoms: Secondary | ICD-10-CM | POA: Diagnosis not present

## 2017-01-31 DIAGNOSIS — N281 Cyst of kidney, acquired: Secondary | ICD-10-CM | POA: Diagnosis not present

## 2017-02-01 ENCOUNTER — Other Ambulatory Visit: Payer: Self-pay | Admitting: Urology

## 2017-02-01 DIAGNOSIS — R972 Elevated prostate specific antigen [PSA]: Secondary | ICD-10-CM

## 2017-02-14 ENCOUNTER — Ambulatory Visit
Admission: RE | Admit: 2017-02-14 | Discharge: 2017-02-14 | Disposition: A | Discharge status: Home / Self Care | Payer: Medicare Other | Source: Ambulatory Visit | Attending: Urology | Admitting: Urology

## 2017-02-14 DIAGNOSIS — N401 Enlarged prostate with lower urinary tract symptoms: Secondary | ICD-10-CM | POA: Diagnosis not present

## 2017-02-14 DIAGNOSIS — R972 Elevated prostate specific antigen [PSA]: Secondary | ICD-10-CM

## 2017-02-14 MED ORDER — GADOBENATE DIMEGLUMINE 529 MG/ML IV SOLN
19.0000 mL | Freq: Once | INTRAVENOUS | Status: AC | PRN
  Administered 2017-02-14: 19 mL via INTRAVENOUS

## 2017-02-28 ENCOUNTER — Ambulatory Visit (INDEPENDENT_AMBULATORY_CARE_PROVIDER_SITE_OTHER): Payer: Medicare Other | Admitting: Pharmacist

## 2017-02-28 DIAGNOSIS — I482 Chronic atrial fibrillation, unspecified: Secondary | ICD-10-CM

## 2017-02-28 DIAGNOSIS — Z7901 Long term (current) use of anticoagulants: Secondary | ICD-10-CM | POA: Diagnosis not present

## 2017-02-28 DIAGNOSIS — Z79899 Other long term (current) drug therapy: Secondary | ICD-10-CM

## 2017-02-28 DIAGNOSIS — I4891 Unspecified atrial fibrillation: Secondary | ICD-10-CM | POA: Diagnosis not present

## 2017-02-28 LAB — POCT INR: INR: 3.1

## 2017-04-11 ENCOUNTER — Ambulatory Visit (INDEPENDENT_AMBULATORY_CARE_PROVIDER_SITE_OTHER): Payer: Medicare Other | Admitting: Pharmacist Clinician (PhC)/ Clinical Pharmacy Specialist

## 2017-04-11 DIAGNOSIS — I4891 Unspecified atrial fibrillation: Secondary | ICD-10-CM | POA: Diagnosis not present

## 2017-04-11 DIAGNOSIS — Z79899 Other long term (current) drug therapy: Secondary | ICD-10-CM | POA: Diagnosis not present

## 2017-04-11 DIAGNOSIS — Z5181 Encounter for therapeutic drug level monitoring: Secondary | ICD-10-CM | POA: Diagnosis not present

## 2017-04-11 DIAGNOSIS — I482 Chronic atrial fibrillation, unspecified: Secondary | ICD-10-CM

## 2017-04-11 DIAGNOSIS — Z7901 Long term (current) use of anticoagulants: Secondary | ICD-10-CM | POA: Diagnosis not present

## 2017-04-11 LAB — POCT INR: INR: 2

## 2017-04-18 DIAGNOSIS — J101 Influenza due to other identified influenza virus with other respiratory manifestations: Secondary | ICD-10-CM | POA: Diagnosis not present

## 2017-05-23 ENCOUNTER — Ambulatory Visit (INDEPENDENT_AMBULATORY_CARE_PROVIDER_SITE_OTHER): Payer: Medicare Other | Admitting: Pharmacist Clinician (PhC)/ Clinical Pharmacy Specialist

## 2017-05-23 DIAGNOSIS — Z7901 Long term (current) use of anticoagulants: Secondary | ICD-10-CM

## 2017-05-23 DIAGNOSIS — I4891 Unspecified atrial fibrillation: Secondary | ICD-10-CM | POA: Diagnosis not present

## 2017-05-23 DIAGNOSIS — I482 Chronic atrial fibrillation, unspecified: Secondary | ICD-10-CM

## 2017-05-23 DIAGNOSIS — Z79899 Other long term (current) drug therapy: Secondary | ICD-10-CM

## 2017-05-23 LAB — POCT INR: INR: 3.6

## 2017-06-24 ENCOUNTER — Encounter: Payer: Self-pay | Admitting: Internal Medicine

## 2017-06-24 ENCOUNTER — Ambulatory Visit (INDEPENDENT_AMBULATORY_CARE_PROVIDER_SITE_OTHER): Payer: Medicare Other | Admitting: Pharmacist

## 2017-06-24 ENCOUNTER — Ambulatory Visit (INDEPENDENT_AMBULATORY_CARE_PROVIDER_SITE_OTHER): Payer: Medicare Other | Admitting: Internal Medicine

## 2017-06-24 VITALS — BP 116/74 | HR 42 | Ht 68.0 in | Wt 186.0 lb

## 2017-06-24 DIAGNOSIS — Z7901 Long term (current) use of anticoagulants: Secondary | ICD-10-CM

## 2017-06-24 DIAGNOSIS — Z79899 Other long term (current) drug therapy: Secondary | ICD-10-CM

## 2017-06-24 DIAGNOSIS — R001 Bradycardia, unspecified: Secondary | ICD-10-CM | POA: Diagnosis not present

## 2017-06-24 DIAGNOSIS — I482 Chronic atrial fibrillation, unspecified: Secondary | ICD-10-CM

## 2017-06-24 DIAGNOSIS — I4891 Unspecified atrial fibrillation: Secondary | ICD-10-CM | POA: Diagnosis not present

## 2017-06-24 DIAGNOSIS — I10 Essential (primary) hypertension: Secondary | ICD-10-CM | POA: Diagnosis not present

## 2017-06-24 LAB — POCT INR: INR: 2.7

## 2017-06-24 MED ORDER — METOPROLOL SUCCINATE ER 25 MG PO TB24: 12.5000 mg | ORAL_TABLET | Freq: Every day | ORAL | 3 refills | Status: DC

## 2017-06-24 MED ORDER — DILTIAZEM HCL ER COATED BEADS 120 MG PO CP24: 120.0000 mg | ORAL_CAPSULE | Freq: Every day | ORAL | 3 refills | Status: AC

## 2017-06-24 MED ORDER — WARFARIN SODIUM 5 MG PO TABS: ORAL_TABLET | ORAL | 1 refills | Status: DC

## 2017-06-24 NOTE — Patient Instructions (Signed)
Your physician has recommended you make the following change in your medication:  -- DECREASE metoprolol succinate (Toprol XL) to 12.5mg  once daily (half of a 25mg  tablet)  Your physician wants you to follow-up in: ONE YEAR with Dr. Rennis GoldenHilty. You will receive a reminder letter in the mail two months in advance. If you don't receive a letter, please call our office to schedule the follow-up appointment.

## 2017-06-24 NOTE — Progress Notes (Signed)
OFFICE NOTE  Chief Complaint:  Fatigue  Primary Care Physician: [name], MD  HPI:  Ross Lewis  is a 77 year old gentleman, who has a history of paroxysmal atrial fibrillation and a prior right cerebellar infarct. He has been on chronic Coumadin therapy. Recently, he has been in sinus rhythm. He had had headaches recently and there was concern about a possible stroke. He then presented to the hospital with an episode of foot drag on the right foot and some heaviness and was found to have a new TIA. This resolved fairly quickly without any further treatments; however, he was seen by neuro stroke service, Dr. Pearlean Brownie and recommended that he switch from Coumadin to Xarelto and was subsequently discharged. Since going home, he has felt fairly well; however, has had some problems with bradycardia. His Toprol XL was decreased to 25 mg daily during the hospitalization. He was maintained on Cardizem and as mentioned has switched to Xarelto. His main concern today is the cost of the Xarelto, which is about $322 a month since he does not have any supplemental drug coverage. But, otherwise, he has had no chest pain symptoms or worsening new stroke symptoms, shortness of breath, palpitations, presyncope, or syncopal symptoms.   I saw Ross Lewis back in the office today. He is maintained on warfarin due to cost issues but seems to be well-controlled. His last 2 INRs were therapeutic. He's had no bleeding complications. He maintains a heart rate in the 50s after decreasing his diltiazem at his last visit. He is asymptomatic with this and reports a heart rate increase with exercise. Generally he feels well. He denies any chest pain or shortness of breath. He is currently in atrial fibrillation with a slow ventricular response.  Ross Lewis returns today for follow-up. Unfortunately he was recently hospitalized for kidney stone. During that hospitalization he was noted be tachycardic and hypertensive,  mostly related to pain. This is resolved and his blood pressure is well-controlled today. Heart rates in the 60s. His rhythm is irregular on exam. He likely has chronic if not permanent A. fib at this point. Due to cost issues he prefers to stay on warfarin. His INR today was elevated at 3.2 and we will make adjustments to his dosing. He denies any bleeding problems, specifically any hematuria.  06/11/2016  Ross Lewis returns today for follow-up. Overall he is doing well. He's had no recurrent kidney stones. Blood pressure well-controlled today. His INR has been therapeutic at 2.7 today. He prefers to stay on warfarin due to cost issues. He said no bleeding problems. At this point is in permanent atrial fibrillation.  06/24/2017  Ross Lewis returns today for follow-up.  Overall he seems to be doing well.  He is on a lot of recent yard work and maintaining a property.  He said he can work for hours but does get fatigued.  It seems like a lot of work for his age.  He says that recently he has been a little more fatigued with working and is noted to be bradycardic today.  Heart rate in the past has been in the 60s however today was 42.  He is on both diltiazem and metoprolol.  Recently his INR was slightly supratherapeutic and he will be rechecked again today.  PMHx:  Past Medical History:  Diagnosis Date  . Atrial fibrillation ([name])   . Chronic back pain   . CKD (chronic kidney disease), stage II   . Hypertension   .  Nephrolithiasis   . Stroke [facility]([name])    right cerebellar infarct  . TIA (transient ischemic attack)     Past Surgical History:  Procedure Laterality Date  . CARDIAC CATHETERIZATION    . CARDIOVERSION  11/01/2002   normal L main, normal LAD, L Cfx normal & nondominant; RCA normal & dominant (Dr. Erlene QuanJ. Berry)  . TONSILLECTOMY    . TRANSTHORACIC ECHOCARDIOGRAM  04/2012   EF 50-65%, LA mod-severely dilated; RV mildly dilated; RA mod dilated    FAMHx:  Family History  Problem Relation Age of  Onset  . Heart attack Father   . Coronary artery disease Brother     SOCHx:   reports that he has never smoked. He has never used smokeless tobacco. He reports that he does not drink alcohol or use drugs.  ALLERGIES:  No Known Allergies  ROS: Pertinent items noted in HPI and remainder of comprehensive ROS otherwise negative.  HOME MEDS: Current Outpatient Medications  Medication Sig Dispense Refill  . diltiazem (CARTIA XT) 120 MG 24 hr capsule Take 1 capsule (120 mg total) by mouth daily. 90 capsule 3  . metoprolol succinate (TOPROL-XL) 25 MG 24 hr tablet Take 1 tablet (25 mg total) by mouth daily. 90 tablet 3  . tamsulosin (FLOMAX) 0.4 MG CAPS capsule Take 1 capsule (0.4 mg total) by mouth 2 (two) times daily. 30 capsule 0  . tamsulosin (FLOMAX) 0.4 MG CAPS capsule Take 1 capsule (0.4 mg total) by mouth every evening. 30 capsule 0  . warfarin (COUMADIN) 5 MG tablet Take 1 to 1.5 tablets by mouth daily or as directed by coumadin clinic 120 tablet 1   No current facility-administered medications for this visit.     LABS/IMAGING: No results found for this or any previous visit (from the past 48 hour(s)). No results found.  VITALS: BP 116/74 (BP Location: Left Arm, Patient Position: Sitting, Cuff Size: Normal)   Pulse (!) 42   Ht 5\' 8"  (1.727 m)   Wt 186 lb (84.4 kg)   BMI 28.28 kg/m   EXAM: General appearance: alert and no distress Neck: no carotid bruit and no JVD Lungs: clear to auscultation bilaterally Heart: irregularly irregular rhythm Abdomen: soft, non-tender; bowel sounds normal; no masses,  no organomegaly Extremities: extremities normal, atraumatic, no cyanosis or edema Pulses: 2+ and symmetric Skin: Skin color, texture, turgor normal. No rashes or lesions Neurologic: Grossly normal PSych: Mood, affect normal  EKG: A. fib with slow ventricular response of 42-personally reviewed  ASSESSMENT: 1. Symptomatic bradycardia 2. Permanent a-fib on warfarin  (CHADSVASC 5) 3. History of stroke and TIA 4. Hypertension 5. Kidney stones  PLAN: 1.   Ross Lewis possibly symptomatic bradycardia.  Heart rates in the 40s.  He is both on diltiazem and metoprolol.  I would recommend decreasing his Toprol-XL to 12.5 mg daily.  He should monitor heart rate and notify us if his heart rates not improving and we may discontinue it completely.  We will plan to recheck his INR today.  Otherwise follow-up annually or sooner as necessary.  Chrystie NoseKenneth C. Charlton Boule, MD, [facility] Am[facility]adFACC, FACP  [place]  [facility]  Medical Director of the [facility]  [facility] Diplomate of the American Board of Clinical Lipidology Attending Cardiologist  Direct Dial: [phone]  Fax: [phone]  Website:  [email or web address]  Lisette AbuKenneth C Chesney Klimaszewski 06/24/2017, 8:10 AM

## 2017-08-02 DIAGNOSIS — R972 Elevated prostate specific antigen [PSA]: Secondary | ICD-10-CM | POA: Diagnosis not present

## 2017-08-05 ENCOUNTER — Ambulatory Visit (INDEPENDENT_AMBULATORY_CARE_PROVIDER_SITE_OTHER): Payer: Medicare Other | Admitting: Pharmacist

## 2017-08-05 DIAGNOSIS — Z79899 Other long term (current) drug therapy: Secondary | ICD-10-CM | POA: Diagnosis not present

## 2017-08-05 DIAGNOSIS — I482 Chronic atrial fibrillation, unspecified: Secondary | ICD-10-CM

## 2017-08-05 DIAGNOSIS — I4891 Unspecified atrial fibrillation: Secondary | ICD-10-CM

## 2017-08-05 DIAGNOSIS — Z7901 Long term (current) use of anticoagulants: Secondary | ICD-10-CM | POA: Diagnosis not present

## 2017-08-05 LAB — POCT INR: INR: 2.2 (ref 2.0–3.0)

## 2017-08-15 DIAGNOSIS — R3915 Urgency of urination: Secondary | ICD-10-CM | POA: Diagnosis not present

## 2017-08-15 DIAGNOSIS — N401 Enlarged prostate with lower urinary tract symptoms: Secondary | ICD-10-CM | POA: Diagnosis not present

## 2017-08-15 DIAGNOSIS — R972 Elevated prostate specific antigen [PSA]: Secondary | ICD-10-CM | POA: Diagnosis not present

## 2017-09-15 ENCOUNTER — Ambulatory Visit (INDEPENDENT_AMBULATORY_CARE_PROVIDER_SITE_OTHER): Payer: Medicare Other | Admitting: Pharmacist

## 2017-09-15 DIAGNOSIS — I482 Chronic atrial fibrillation, unspecified: Secondary | ICD-10-CM

## 2017-09-15 DIAGNOSIS — I4891 Unspecified atrial fibrillation: Secondary | ICD-10-CM

## 2017-09-15 DIAGNOSIS — Z79899 Other long term (current) drug therapy: Secondary | ICD-10-CM | POA: Diagnosis not present

## 2017-09-15 DIAGNOSIS — Z7901 Long term (current) use of anticoagulants: Secondary | ICD-10-CM

## 2017-09-15 LAB — POCT INR: INR: 2.5 (ref 2.0–3.0)

## 2017-10-19 ENCOUNTER — Encounter (HOSPITAL_COMMUNITY): Payer: Self-pay | Admitting: Emergency Medicine

## 2017-10-19 ENCOUNTER — Emergency Department (HOSPITAL_COMMUNITY): Payer: Medicare Other

## 2017-10-19 ENCOUNTER — Emergency Department (HOSPITAL_COMMUNITY)
Admission: EM | Admit: 2017-10-19 | Discharge: 2017-10-19 | Disposition: A | Discharge status: Home / Self Care | Payer: Medicare Other | Attending: Emergency Medicine | Admitting: Emergency Medicine

## 2017-10-19 DIAGNOSIS — I129 Hypertensive chronic kidney disease with stage 1 through stage 4 chronic kidney disease, or unspecified chronic kidney disease: Secondary | ICD-10-CM | POA: Insufficient documentation

## 2017-10-19 DIAGNOSIS — I951 Orthostatic hypotension: Secondary | ICD-10-CM

## 2017-10-19 DIAGNOSIS — R55 Syncope and collapse: Secondary | ICD-10-CM | POA: Insufficient documentation

## 2017-10-19 DIAGNOSIS — I4891 Unspecified atrial fibrillation: Secondary | ICD-10-CM | POA: Diagnosis not present

## 2017-10-19 DIAGNOSIS — R0902 Hypoxemia: Secondary | ICD-10-CM | POA: Diagnosis not present

## 2017-10-19 DIAGNOSIS — Z79899 Other long term (current) drug therapy: Secondary | ICD-10-CM | POA: Insufficient documentation

## 2017-10-19 DIAGNOSIS — W19XXXA Unspecified fall, initial encounter: Secondary | ICD-10-CM | POA: Diagnosis not present

## 2017-10-19 DIAGNOSIS — Z7901 Long term (current) use of anticoagulants: Secondary | ICD-10-CM | POA: Diagnosis not present

## 2017-10-19 DIAGNOSIS — N182 Chronic kidney disease, stage 2 (mild): Secondary | ICD-10-CM | POA: Diagnosis not present

## 2017-10-19 DIAGNOSIS — R0689 Other abnormalities of breathing: Secondary | ICD-10-CM | POA: Diagnosis not present

## 2017-10-19 LAB — PROTIME-INR
INR: 2.23
Prothrombin Time: 24.5 seconds — ABNORMAL HIGH (ref 11.4–15.2)

## 2017-10-19 LAB — CBC
HEMATOCRIT: 48.5 % (ref 39.0–52.0)
Hemoglobin: 15.6 g/dL (ref 13.0–17.0)
MCH: 32.2 pg (ref 26.0–34.0)
MCHC: 32.2 g/dL (ref 30.0–36.0)
MCV: 100.2 fL — AB (ref 78.0–100.0)
Platelets: 155 10*3/uL (ref 150–400)
RBC: 4.84 MIL/uL (ref 4.22–5.81)
RDW: 13.5 % (ref 11.5–15.5)
WBC: 6.5 10*3/uL (ref 4.0–10.5)

## 2017-10-19 LAB — BASIC METABOLIC PANEL
ANION GAP: 6 (ref 5–15)
BUN: 13 mg/dL (ref 8–23)
CHLORIDE: 114 mmol/L — AB (ref 98–111)
CO2: 23 mmol/L (ref 22–32)
Calcium: 9.8 mg/dL (ref 8.9–10.3)
Creatinine, Ser: 1.33 mg/dL — ABNORMAL HIGH (ref 0.61–1.24)
GFR calc Af Amer: 57 mL/min — ABNORMAL LOW (ref >60)
GFR, EST NON AFRICAN AMERICAN: 49 mL/min — AB (ref >60)
GLUCOSE: 111 mg/dL — AB (ref 70–99)
POTASSIUM: 4.7 mmol/L (ref 3.5–5.1)
Sodium: 143 mmol/L (ref 135–145)

## 2017-10-19 LAB — I-STAT TROPONIN, ED
TROPONIN I, POC: 0 ng/mL (ref 0.00–0.08)
TROPONIN I, POC: 0 ng/mL (ref 0.00–0.08)

## 2017-10-19 LAB — URINALYSIS, ROUTINE W REFLEX MICROSCOPIC
BILIRUBIN URINE: NEGATIVE
GLUCOSE, UA: NEGATIVE mg/dL
Hgb urine dipstick: NEGATIVE
Ketones, ur: 5 mg/dL — AB
Leukocytes, UA: NEGATIVE
NITRITE: NEGATIVE
PH: 5 (ref 5.0–8.0)
Protein, ur: NEGATIVE mg/dL
SPECIFIC GRAVITY, URINE: 1.018 (ref 1.005–1.030)

## 2017-10-19 LAB — CBG MONITORING, ED: Glucose-Capillary: 96 mg/dL (ref 70–99)

## 2017-10-19 MED ORDER — SODIUM CHLORIDE 0.9 % IV BOLUS
500.0000 mL | Freq: Once | INTRAVENOUS | Status: AC
  Administered 2017-10-19: 500 mL via INTRAVENOUS

## 2017-10-19 NOTE — ED Provider Notes (Signed)
[facility] Provider Note   CSN: [id number] Arrival date & time: 10/19/17  1220     History   Chief Complaint Chief Complaint  Patient presents with  . Near Syncope    HPI Ross Lewis is a 77 y.o. male with a past medical history of a-fib, CKD2, Stroke, TIAx3, chronically anticoagulated with Coumadin, and today for evaluation of a near syncopal event.  He reports that he was at work when he started feeling dizzy with headache and fatigue while walking.  He denies any abdominal pain, no nausea or vomiting.  No shortness of breath.  He reports that during this near syncopal event he felt lightheaded, like he was in a pass out, had a headache, and was very diaphoretic.  He denies fully passing out.  His symptoms resolved when he laid flat.    HPI  Past Medical History:  Diagnosis Date  . Atrial fibrillation ([name])   . Chronic back pain   . CKD (chronic kidney disease), stage II   . Hypertension   . Nephrolithiasis   . Stroke [facility]([name])    right cerebellar infarct  . TIA (transient ischemic attack)     Patient Active Problem List   Diagnosis Date Noted  . Symptomatic bradycardia 06/24/2017  . Acute on chronic kidney failure-II 06/02/2015  . Hydronephrosis of left kidney 06/02/2015  . Kidney stone on left side 06/02/2015  . Nephrolithiasis   . Anticoagulated on Coumadin   . Long term current use of anticoagulant therapy 05/17/2012  . TIA (transient ischemic attack) 05/02/2012  . Chronic a-fib ([name]) 05/02/2012  . Lumbago without sciatica 05/02/2012  . Essential hypertension 05/02/2012    Past Surgical History:  Procedure Laterality Date  . CARDIAC CATHETERIZATION    . CARDIOVERSION  11/01/2002   normal L main, normal LAD, L Cfx normal & nondominant; RCA normal & dominant (Dr. Erlene QuanJ. Berry)  . TONSILLECTOMY    . TRANSTHORACIC ECHOCARDIOGRAM  04/2012   EF 50-65%, LA mod-severely dilated; RV mildly dilated; RA mod dilated        Home  Medications    Prior to Admission medications   Medication Sig Start Date End Date Taking? Authorizing Provider  diltiazem (CARTIA XT) 120 MG 24 hr capsule Take 1 capsule (120 mg total) by mouth daily. 06/24/17   Hilty, Lisette AbuKenneth C, MD  metoprolol succinate (TOPROL-XL) 25 MG 24 hr tablet Take 0.5 tablets (12.5 mg total) by mouth daily. 06/24/17   Hilty, Lisette AbuKenneth C, MD  tamsulosin (FLOMAX) 0.4 MG CAPS capsule Take 1 capsule (0.4 mg total) by mouth 2 (two) times daily. 06/04/15   Marinda ElkFeliz Ortiz, Abraham, MD  tamsulosin (FLOMAX) 0.4 MG CAPS capsule Take 1 capsule (0.4 mg total) by mouth every evening. 06/21/15   Darron DoomSpencer, Elysia S, MD  warfarin (COUMADIN) 5 MG tablet Take 1 to 1.5 tablets by mouth daily or as directed by coumadin clinic 06/24/17   Hilty, Lisette AbuKenneth C, MD    Family History Family History  Problem Relation Age of Onset  . Heart attack Father   . Coronary artery disease Brother     Social History Social History   Tobacco Use  . Smoking status: Never Smoker  . Smokeless tobacco: Never Used  Substance Use Topics  . Alcohol use: No  . Drug use: No     Allergies   Patient has no known allergies.   Review of Systems Review of Systems  Constitutional: Positive for diaphoresis (Resolved). Negative for chills and  fever.  HENT: Negative for ear pain and sore throat.   Eyes: Negative for pain and visual disturbance.  Respiratory: Negative for cough and shortness of breath.   Cardiovascular: Negative for chest pain and palpitations.  Gastrointestinal: Negative for abdominal pain, diarrhea, nausea and vomiting.  Genitourinary: Negative for dysuria, hematuria and urgency.  Musculoskeletal: Negative for arthralgias and back pain.  Skin: Negative for color change and rash.  Neurological: Positive for light-headedness (Resolved) and headaches (Resolved). Negative for dizziness, seizures, syncope (Near syncope), facial asymmetry, speech difficulty and weakness.  All other systems reviewed and  are negative.    Physical Exam Updated Vital Signs BP (!) 152/74   Pulse (!) 59   Temp 98 F (36.7 C) (Oral)   Resp 16   SpO2 98%   Physical Exam  Constitutional: He is oriented to person, place, and time. He appears well-developed and well-nourished. No distress.  HENT:  Head: Normocephalic and atraumatic.  Eyes: Conjunctivae are normal.  Neck: Normal range of motion. Neck supple.  Cardiovascular: Normal rate, regular rhythm, normal heart sounds and intact distal pulses. Exam reveals no gallop and no friction rub.  No murmur heard. Pulmonary/Chest: Effort normal and breath sounds normal. No respiratory distress.  Abdominal: Soft. Bowel sounds are normal. He exhibits no distension. There is no tenderness. There is no guarding.  Musculoskeletal: He exhibits no edema.  Neurological: He is alert and oriented to person, place, and time.  No facial droop.  Speech is not slurred.  5/5 strength in bilateral upper and lower extremities.  Skin: Skin is warm and dry. He is not diaphoretic.  Psychiatric: He has a normal mood and affect.  Nursing note and vitals reviewed.    ED Treatments / Results  Labs (all labs ordered are listed, but only abnormal results are displayed) Labs Reviewed  BASIC METABOLIC PANEL - Abnormal; Notable for the following components:      Result Value   Chloride 114 (*)    Glucose, Bld 111 (*)    Creatinine, Ser 1.33 (*)    GFR calc non Af Amer 49 (*)    GFR calc Af Amer 57 (*)    All other components within normal limits  CBC - Abnormal; Notable for the following components:   MCV 100.2 (*)    All other components within normal limits  URINALYSIS, ROUTINE W REFLEX MICROSCOPIC - Abnormal; Notable for the following components:   Ketones, ur 5 (*)    All other components within normal limits  PROTIME-INR - Abnormal; Notable for the following components:   Prothrombin Time 24.5 (*)    All other components within normal limits  CBG MONITORING, ED    I-STAT TROPONIN, ED    EKG None  Radiology Ct Head Wo Contrast  Result Date: 10/19/2017 CLINICAL DATA:  Near syncopal episode while working of side today. EXAM: CT HEAD WITHOUT CONTRAST TECHNIQUE: Contiguous axial images were obtained from the base of the skull through the vertex without intravenous contrast. COMPARISON:  05/01/2012 FINDINGS: Brain: Ventricles, cisterns and other CSF spaces are within normal. There is no mass, mass effect, shift of midline structures or acute hemorrhage. No evidence of acute infarction. Subtle basal ganglia calcifications are present. Vascular: No hyperdense vessel or unexpected calcification. Skull: Normal. Negative for fracture or focal lesion. Sinuses/Orbits: Orbits are normal. Paranasal sinuses are well developed with moderate opacification over the left maxillary sinus. Mastoid air cells are clear. Other: None. IMPRESSION: No acute findings. Chronic inflammatory change of the left maxillary  sinus. Electronically Signed   By: Elberta Fortisaniel  Boyle M.D.   On: 10/19/2017 14:38    Procedures  Procedures (including critical care time     Orthostatic Lying  BP- Lying:152/86 Pulse- Lying:57 (Range from 36-57bpm) Orthostatic Sitting BP- Sitting:141/82 Pulse- Sitting:63 (52-63bpm) Orthostatic Standing at 0 minutes BP- Standing at 0 minutes:132/77 Pulse- Standing at 0 minutes:73 (35-77bpm)   Medications Ordered in ED Medications  sodium chloride 0.9 % bolus 500 mL (has no administration in time range)     Initial Impression / Assessment and Plan / ED Course  I have reviewed the triage vital signs and the nursing notes.  Pertinent labs & imaging results that were available during my care of the patient were reviewed by me and considered in my medical decision making (see chart for details).  Clinical Course as of Oct 20 1616  Wed Oct 19, 2017  1518 Patient reevaluated, he was orthostatic on orthostatic vitals.  Encouraged p.o. fluids, will give IV  fluids, and reassess.  Still plan to check a 3-hour troponin.   [EH]    Clinical Course User Index [EH] Cristina GongHammond, Jozy Mcphearson W, PA-C   Patient presents today for evaluation of a near syncopal event that occurred after he got up and started walking.  He initially reported headache during this, however that went away.  CT head was obtained without acute abnormalities noted.  Patient was neurologically intact.  He was noted to be orthostatic on vitals.  Plan is to rehydrate patient both IV and orally, and recheck vitals along with a 3-hour troponin.    At shift change care was transferred to Talitha GivensNick Ashburn Resident who will follow pending studies, re-evaulate and determine disposition.      Final Clinical Impressions(s) / ED Diagnoses   Final diagnoses:  Near syncope  Orthostasis    ED Discharge Orders    None       Norman ClayHammond, Adryan Shin W, PA-C 10/20/17 1619    Arby BarrettePfeiffer, Marcy, MD 10/25/17 [phone]

## 2017-10-19 NOTE — ED Provider Notes (Signed)
Transfer of Care Note I assumed care of Ross Lewis on 10/19/2017  Briefly, Ross Lewis is a 77 y.o. male who:  Near syncope  +orthostatics  Getting IVF  The plan includes:  Reassess   Please refer to the original provider's note for additional information regarding the care of Ross Lewis.  ### Reassessment: I personally reassessed the patient:  Vital Signs:  The most current vitals were  Vitals:   10/19/17 1221 10/19/17 1222  BP:  (!) 152/74  Pulse:  (!) 59  Resp:  16  Temp:  98 F (36.7 C)  SpO2: 97% 98%    Hemodynamics:  The patient is hemodynamically stable. Mental Status:  The patient is Alert  Additional MDM: I personally reassessed the patient.  I reviewed his imaging, labs, and ECG.  His head CT shows no evidence of acute hemorrhage or ischemia.  I do not appreciate a heart murmur.  His ECG reveals no evidence of acute ischemia or acute dysrhythmia (atrial fibrillation chronic, on treatment already).  After receiving IV fluids, he ambulated without difficulty.  His repeat orthostatic vital signs were normal.  He denied feeling orthostatic after ambulating.  He feels safe going home.  I believe that this disposition is safe and appropriate.  I provided thorough ED return precautions.    Talitha GivensAshburn, Darsh Vandevoort, MD 10/19/17 [place]1902    Floyd, Dan, DO 10/19/17 1950    Melene PlanFloyd, Dan, DO 10/19/17 1951

## 2017-10-19 NOTE — ED Notes (Signed)
Pt stable, ambulatory, states understanding of discharge instructions 

## 2017-10-19 NOTE — ED Triage Notes (Signed)
[facility] after near-syncopal episode while working outside today - reports dizziness, headache, fatigue. Ambulated inside and was assisted into chair, no LOC or injury. Patient hx A-Fib, 3 TIAs. EMS VS: HR 60-85 A-Fib, 144/89, 97% RA, CBG 118. 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 Neuro intact. Patient denies CP/SOB. 18g. PIV RAC. Resp e/u, skin warm/dry.

## 2017-10-19 NOTE — ED Notes (Signed)
Ambulated pt in hallway. Pt tolerated it with no assistance.

## 2017-10-19 NOTE — Discharge Instructions (Addendum)
Please make sure you are staying well hydrated.  Please follow up with your primary care doctor.  If your symptoms worsen or you have any concerns please seek additional medical care.

## 2017-10-20 ENCOUNTER — Encounter: Payer: Self-pay | Admitting: Physician Assistant

## 2017-10-20 ENCOUNTER — Ambulatory Visit (INDEPENDENT_AMBULATORY_CARE_PROVIDER_SITE_OTHER): Payer: Medicare Other | Admitting: Physician Assistant

## 2017-10-20 ENCOUNTER — Ambulatory Visit (HOSPITAL_COMMUNITY)
Admission: RE | Admit: 2017-10-20 | Discharge: 2017-10-20 | Disposition: A | Discharge status: Home / Self Care | Payer: Medicare Other | Source: Ambulatory Visit | Attending: Cardiology | Admitting: Cardiology

## 2017-10-20 VITALS — BP 121/76 | HR 57 | Ht 68.0 in | Wt 186.6 lb

## 2017-10-20 DIAGNOSIS — R001 Bradycardia, unspecified: Secondary | ICD-10-CM | POA: Diagnosis not present

## 2017-10-20 DIAGNOSIS — R55 Syncope and collapse: Secondary | ICD-10-CM

## 2017-10-20 DIAGNOSIS — R42 Dizziness and giddiness: Secondary | ICD-10-CM

## 2017-10-20 DIAGNOSIS — I10 Essential (primary) hypertension: Secondary | ICD-10-CM | POA: Diagnosis not present

## 2017-10-20 DIAGNOSIS — I482 Chronic atrial fibrillation, unspecified: Secondary | ICD-10-CM

## 2017-10-20 NOTE — Patient Instructions (Signed)
Medication Instructions: Your physician recommends that you continue on your current medications as directed.    If you need a refill on your cardiac medications before your next appointment, please call your pharmacy.   Labwork: None  Procedures/Testing: Your physician has requested that you have a carotid duplex. This test is an ultrasound of the carotid arteries in your neck. It looks at blood flow through these arteries that supply the brain with blood. Allow one hour for this exam. There are no restrictions or special instructions. [address]  Your physician has recommended that you wear an event monitor for 30 days. Event monitors are medical devices that record the heart's electrical activity. Doctors most often us these monitors to diagnose arrhythmias. Arrhythmias are problems with the speed or rhythm of the heartbeat. The monitor is a small, portable device. You can wear one while you do your normal daily activities. This is usually used to diagnose what is causing palpitations/syncope (passing out). [address]    Follow-Up: Your physician wants you to follow-up in 2 months with Dr. Rennis GoldenHilty  Special Instructions:    [facility] you [facility]!!

## 2017-10-20 NOTE — Progress Notes (Signed)
Cardiology Office Note    Date:  10/20/2017   ID:  Ross Lewis, DOB [birth year], MRN [id number]  PCP:  Barbie BannerWilson, Fred H, MD  Cardiologist:  Dr. Rennis GoldenHilty   Chief Complaint  Patient presents with  . Follow-up    ER visit for syncope yesterday    History of Present Illness:  Ross BolognaWilliam L Lewis is a 77 y.o. male with PMH of right cerebellar infarct, chronic atrial fibrillation on Coumadin, hypertension and chronic back pain.  He used to take Xarelto, however later switched back to Coumadin due to concern of cost.  Last echocardiogram obtained on 05/02/2012 showed EF 50 to 65%, ventricular septal dyssynergy was noted, moderately to severely dilated left atrium.  He was recently seen by Dr. Rennis GoldenHilty on 06/24/2017, Toprol-XL was reduced to 12.5 due to bradycardia.  [facility] for dizziness, headache and fatigue while at work.  EKG showed atrial fibrillation with a heart rate in the 60s.  CT of the head was negative for acute process.  Troponin was negative.  Urinalysis was negative for UTI.   According to the patient, he was getting up and walking towards a warehouse when he became severely diaphoretic and dizzy.  He asked his son to bring him to the hospital.  He is not sure if he truly lost consciousness, however he says if he did it was very brief.  He occasionally has some dizziness when he gets up too quickly in the morning, however he says most of the days he would get up and go.  Initial orthostatic vitals on ED arrival was 152/86-141/82-132/77 which was positive.  After IV hydration, a second orthostatic vitals was obtained (153/76-145/89-146/81).  However it is unclear to me how low his blood pressure was at the time of the syncope.  Even with a blood pressure 130, he should not be passing out.  I have reviewed all the EMS strips he brought with him today, now which showed significant bradycardia or prolonged pauses.  I recommend a carotid ultrasound and also a 30-day event  monitor to further evaluate.  If both are negative, I would recommend keep adequate hydration between 32 to 64 ounces of fluid per day.  She denied any other prodromal symptoms prior to passing out such as chest pain or shortness of breath.   Past Medical History:  Diagnosis Date  . Atrial fibrillation ([name])   . Chronic back pain   . CKD (chronic kidney disease), stage II   . Hypertension   . Nephrolithiasis   . Stroke [facility]([name])    right cerebellar infarct  . TIA (transient ischemic attack)     Past Surgical History:  Procedure Laterality Date  . CARDIAC CATHETERIZATION    . CARDIOVERSION  11/01/2002   normal L main, normal LAD, L Cfx normal & nondominant; RCA normal & dominant (Dr. Erlene QuanJ. Berry)  . TONSILLECTOMY    . TRANSTHORACIC ECHOCARDIOGRAM  04/2012   EF 50-65%, LA mod-severely dilated; RV mildly dilated; RA mod dilated    Current Medications: Outpatient Medications Prior to Visit  Medication Sig Dispense Refill  . diltiazem (CARTIA XT) 120 MG 24 hr capsule Take 1 capsule (120 mg total) by mouth daily. 90 capsule 3  . metoprolol succinate (TOPROL-XL) 25 MG 24 hr tablet Take 0.5 tablets (12.5 mg total) by mouth daily. 45 tablet 3  . tamsulosin (FLOMAX) 0.4 MG CAPS capsule Take 1 capsule (0.4 mg total) by mouth every evening. (Patient taking differently:  Take 0.4 mg by mouth daily. ) 30 capsule 0  . warfarin (COUMADIN) 5 MG tablet Take 1 to 1.5 tablets by mouth daily or as directed by coumadin clinic (Patient taking differently: Take 5-7.5 mg by mouth See admin instructions. Take 5 mg by mouth in the morning on Sun/Mon/Tues/Wed/Thurs/Sat and 7.5 mg on Fri) 120 tablet 1  . tamsulosin (FLOMAX) 0.4 MG CAPS capsule Take 1 capsule (0.4 mg total) by mouth 2 (two) times daily. 30 capsule 0   No facility-administered medications prior to visit.      Allergies:   Patient has no known allergies.   Social History   Socioeconomic History  . Marital status: Married    Spouse name: Not on file    . Number of children: Not on file  . Years of education: Not on file  . Highest education level: Not on file  Occupational History  . Not on file  Social Needs  . Financial resource strain: Not on file  . Food insecurity:    Worry: Not on file    Inability: Not on file  . Transportation needs:    Medical: Not on file    Non-medical: Not on file  Tobacco Use  . Smoking status: Never Smoker  . Smokeless tobacco: Never Used  Substance and Sexual Activity  . Alcohol use: No  . Drug use: No  . Sexual activity: Yes    Partners: Female  Lifestyle  . Physical activity:    Days per week: Not on file    Minutes per session: Not on file  . Stress: Not on file  Relationships  . Social connections:    Talks on phone: Not on file    Gets together: Not on file    Attends religious service: Not on file    Active member of club or organization: Not on file    Attends meetings of clubs or organizations: Not on file    Relationship status: Not on file  Other Topics Concern  . Not on file  Social History Narrative  . Not on file     Family History:  The patient's family history includes Coronary artery disease in his brother; Heart attack in his father.   ROS:   Please see the history of present illness.    ROS All other systems reviewed and are negative.   PHYSICAL EXAM:   VS:  BP 121/76   Pulse (!) 57   Ht 5\' 8"  (1.727 m)   Wt 186 lb 9.6 oz (84.6 kg)   BMI 28.37 kg/m    GEN: Well nourished, well developed, in no acute distress  HEENT: normal  Neck: no JVD, carotid bruits, or masses Cardiac: irregular irregular; no murmurs, rubs, or gallops,no edema  Respiratory:  clear to auscultation bilaterally, normal work of breathing GI: soft, nontender, nondistended, + BS MS: no deformity or atrophy  Skin: warm and dry, no rash Neuro:  Alert and Oriented x 3, Strength and sensation are intact Psych: euthymic mood, full affect  Wt Readings from Last 3 Encounters:  10/20/17 186  lb 9.6 oz (84.6 kg)  06/24/17 186 lb (84.4 kg)  06/11/16 200 lb 12.8 oz (91.1 kg)      Studies/Labs Reviewed:   EKG:  EKG is not ordered today.   Recent Labs: 10/19/2017: BUN 13; Creatinine, Ser 1.33; Hemoglobin 15.6; Platelets 155; Potassium 4.7; Sodium 143   Lipid Panel    Component Value Date/Time   CHOL 171 05/24/2013 0854  TRIG 97 05/24/2013 0854   HDL 38 (L) 05/24/2013 0854   CHOLHDL 4.5 05/24/2013 0854   VLDL 19 05/24/2013 0854   LDLCALC 114 (H) 05/24/2013 0854    Additional studies/ records that were reviewed today include:   Echo 05/02/2012 LV EF: 50% -  65% Study Conclusions  - Left ventricle: The cavity size was normal. Wall thickness was normal. Systolic function was normal. The estimated ejection fraction was in the range of 50% to 65% with beat to beat variation. Although no diagnostic regional wall motion abnormality was identified, this possibility cannot be completely excluded on the basis of this study. - Ventricular septum: Septal motion showed mild dyssynergy. - Left atrium: The atrium was moderately to severely dilated. - Right ventricle: The cavity size was mildly dilated. Wall thickness was normal. - Right atrium: The atrium was moderately dilated. - Atrial septum: The septum was thickened. No defect or patent foramen ovale was identified. Impressions:  - An atrial fibrillation rhythm was noted on this study, making this study technically difficult for the assessment of cardiac function.    ASSESSMENT:    1. Pre-syncope   2. Dizzy   3. Bradycardia   4. Chronic atrial fibrillation ([name])   5. Essential hypertension      PLAN:  In order of problems listed above:  1. Presyncope: He is not sure if he is truly passed out yesterday.  He never had this type of symptom before and has not had any recurrent syncopal episode.  On initial ED visit, he had positive orthostatic vital signs, after IV hydration, repeat  orthostatic vital signs were normal.  However even with the initial positive orthostatic vital signs, the lowest blood pressure was 132/77.  I recommended additional study with carotid ultrasound and also 30-day event monitor to make sure he does not have any recurrent bradycardia.  2. Chronic atrial fibrillation: Rate controlled with heart rate in the 60s.  Coumadin level has been very well controlled.  I have discussed with our clinical pharmacist, since he is Coumadin level was normal in the ED yesterday, will move next week's Coumadin clinic visit to 6 weeks from now.  3. Hypertension: Blood pressure stable    Medication Adjustments/Labs and Tests Ordered: Current medicines are reviewed at length with the patient today.  Concerns regarding medicines are outlined above.  Medication changes, Labs and Tests ordered today are listed in the Patient Instructions below. Patient Instructions  Medication Instructions: Your physician recommends that you continue on your current medications as directed.    If you need a refill on your cardiac medications before your next appointment, please call your pharmacy.   Labwork: None  Procedures/Testing: Your physician has requested that you have a carotid duplex. This test is an ultrasound of the carotid arteries in your neck. It looks at blood flow through these arteries that supply the brain with blood. Allow one hour for this exam. There are no restrictions or special instructions. [address]  Your physician has recommended that you wear an event monitor for 30 days. Event monitors are medical devices that record the heart's electrical activity. Doctors most often [place] these monitors to diagnose arrhythmias. Arrhythmias are problems with the speed or rhythm of the heartbeat. The monitor is a small, portable device. You can wear one while you do your normal daily activities. This is usually used to diagnose what is causing  palpitations/syncope (passing out). [address]    Follow-Up: Your  physician wants you to follow-up in 2 months with Dr. Rennis Golden  Special Instructions:    Thank you for choosing Heartcare at [facility], [place], [place]  10/20/2017 2:56 PM    [facility] [address] [place], [place], [place]  [address] Phone: [phone]; Fax: [phone]

## 2017-10-21 NOTE — Progress Notes (Signed)
Only mild carotid disease

## 2017-10-25 ENCOUNTER — Ambulatory Visit (INDEPENDENT_AMBULATORY_CARE_PROVIDER_SITE_OTHER): Payer: Medicare Other

## 2017-10-25 ENCOUNTER — Telehealth: Payer: Self-pay | Admitting: Internal Medicine

## 2017-10-25 DIAGNOSIS — R55 Syncope and collapse: Secondary | ICD-10-CM

## 2017-10-25 DIAGNOSIS — R42 Dizziness and giddiness: Secondary | ICD-10-CM

## 2017-10-25 DIAGNOSIS — R001 Bradycardia, unspecified: Secondary | ICD-10-CM | POA: Diagnosis not present

## 2017-10-25 NOTE — Telephone Encounter (Signed)
Triage call received from DJ @ [employer] for a critical EKG monitor.  DJ reports that the pt had one earlier episode of Aflutter that was sustained for 1 minute, the pt is currently in Aflutter. Adv DJ that we will f/u with the pt.  Called and spoke with pt. Pt sts that he feel fine. Denies chest pain, palpitations, dizziness, syncope. Adv pt that we received a call from [employer] that he was experiencing an arrhythmia.  pt is currently on antiocoag therapy (Coumadin).  Adv pt that I will discuss the monitor results with Dr.Hilty or the ordering provider Azalee CourseHao Meng, PA and call him back if any additional recommendations.  Pt agreeable with plan and verbalized understanding.

## 2017-10-25 NOTE — Telephone Encounter (Signed)
New Message ° ° ° °DJ with [employer] is calling with a critical EKG °

## 2017-10-25 NOTE — Telephone Encounter (Signed)
Monitor strips reviewed by Azalee CourseHao Meng, PA and Dr.Hilty. Interpreted as Artifact. Nothing else needed at this time

## 2017-11-04 ENCOUNTER — Telehealth: Payer: Self-pay

## 2017-11-04 NOTE — Telephone Encounter (Signed)
Spoke with pt. He is asymptomatic and is doing well.  Received a duplicate copy of the cardiac strip that has previously been reviewed by Dr.Hilty on 10/25/17.  Also reviewed by Dr.Croitoru today, he agrees with Dr.Hilty's interpretation.Please see telephone encounter for 8/27.    Pt should complete the monitor as planned. No further action needed.  Adv pt to contact the office if symptoms develop. Pt agreeable with plan and verbalized understanding.

## 2017-11-21 DIAGNOSIS — Z23 Encounter for immunization: Secondary | ICD-10-CM | POA: Diagnosis not present

## 2017-11-21 DIAGNOSIS — K219 Gastro-esophageal reflux disease without esophagitis: Secondary | ICD-10-CM | POA: Diagnosis not present

## 2017-12-06 ENCOUNTER — Ambulatory Visit (INDEPENDENT_AMBULATORY_CARE_PROVIDER_SITE_OTHER): Payer: Medicare Other | Admitting: Pharmacist Clinician (PhC)/ Clinical Pharmacy Specialist

## 2017-12-06 DIAGNOSIS — I4891 Unspecified atrial fibrillation: Secondary | ICD-10-CM

## 2017-12-06 DIAGNOSIS — Z79899 Other long term (current) drug therapy: Secondary | ICD-10-CM

## 2017-12-06 DIAGNOSIS — I482 Chronic atrial fibrillation, unspecified: Secondary | ICD-10-CM

## 2017-12-06 DIAGNOSIS — Z7901 Long term (current) use of anticoagulants: Secondary | ICD-10-CM | POA: Diagnosis not present

## 2017-12-06 LAB — POCT INR: INR: 3.1 — AB (ref 2.0–3.0)

## 2017-12-06 NOTE — Patient Instructions (Signed)
Description   Continue with 1 tablet daily except 1.5 tablets each Friday.  Repeat INR in 6 weeks     

## 2017-12-20 ENCOUNTER — Ambulatory Visit (INDEPENDENT_AMBULATORY_CARE_PROVIDER_SITE_OTHER): Payer: Medicare Other | Admitting: Internal Medicine

## 2017-12-20 ENCOUNTER — Encounter: Payer: Self-pay | Admitting: Internal Medicine

## 2017-12-20 VITALS — BP 132/80 | HR 62 | Ht 68.0 in | Wt 188.4 lb

## 2017-12-20 DIAGNOSIS — I10 Essential (primary) hypertension: Secondary | ICD-10-CM | POA: Diagnosis not present

## 2017-12-20 DIAGNOSIS — I482 Chronic atrial fibrillation, unspecified: Secondary | ICD-10-CM

## 2017-12-20 DIAGNOSIS — R55 Syncope and collapse: Secondary | ICD-10-CM

## 2017-12-20 NOTE — Patient Instructions (Signed)
Your physician recommends that you continue on your current medications as directed. Please refer to the Current Medication list given to you today.  If you need a refill on your cardiac medications before your next appointment, please call your pharmacy.   Follow-Up: At [facility], you and your health needs are our priority.  As part of our continuing mission to provide you with exceptional heart care, we have created designated Provider Care Teams.  These Care Teams include your primary Cardiologist (physician) and Advanced Practice Providers (APPs -  Physician Assistants and Nurse Practitioners) who all work together to provide you with the care you need, when you need it. You will need a follow up appointment in 12 months.  Please call our office 2 months in advance to schedule this appointment.  You may see Chrystie Nose, MD or one of the following Advanced Practice Providers on your designated Care Team: [place], [place] . Micah Flesher, PA-C

## 2017-12-20 NOTE — Progress Notes (Signed)
OFFICE NOTE  Chief Complaint:  Follow-up syncope  Primary Care Physician: Ross Banner, MD  HPI:  Ross Lewis  is a 77 year old gentleman, who has a history of paroxysmal atrial fibrillation and a prior right cerebellar infarct. He has been on chronic Coumadin therapy. Recently, he has been in sinus rhythm. He had had headaches recently and there was concern about a possible stroke. He then presented to the hospital with an episode of foot drag on the right foot and some heaviness and was found to have a new TIA. This resolved fairly quickly without any further treatments; however, he was seen by neuro stroke service, Ross Lewis and recommended that he switch from Coumadin to Xarelto and was subsequently discharged. Since going home, he has felt fairly well; however, has had some problems with bradycardia. His Toprol XL was decreased to 25 mg daily during the hospitalization. He was maintained on Cardizem and as mentioned has switched to Xarelto. His main concern today is the cost of the Xarelto, which is about $322 a month since he does not have any supplemental drug coverage. But, otherwise, he has had no chest pain symptoms or worsening new stroke symptoms, shortness of breath, palpitations, presyncope, or syncopal symptoms.   I saw Ross Lewis back in the office today. He is maintained on warfarin due to cost issues but seems to be well-controlled. His last 2 INRs were therapeutic. He's had no bleeding complications. He maintains a heart rate in the 50s after decreasing his diltiazem at his last visit. He is asymptomatic with this and reports a heart rate increase with exercise. Generally he feels well. He denies any chest pain or shortness of breath. He is currently in atrial fibrillation with a slow ventricular response.  Ross Lewis returns today for follow-up. Unfortunately he was recently hospitalized for kidney stone. During that hospitalization he was noted be tachycardic and  hypertensive, mostly related to pain. This is resolved and his blood pressure is well-controlled today. Heart rates in the 60s. His rhythm is irregular on exam. He likely has chronic if not permanent A. fib at this point. Due to cost issues he prefers to stay on warfarin. His INR today was elevated at 3.2 and we will make adjustments to his dosing. He denies any bleeding problems, specifically any hematuria.  06/11/2016  Ross Lewis returns today for follow-up. Overall he is doing well. He's had no recurrent kidney stones. Blood pressure well-controlled today. His INR has been therapeutic at 2.7 today. He prefers to stay on warfarin due to cost issues. He said no bleeding problems. At this point is in permanent atrial fibrillation.  06/24/2017  Ross Lewis returns today for follow-up.  Overall he seems to be doing well.  He is on a lot of recent yard work and maintaining a property.  He said he can work for hours but does get fatigued.  It seems like a lot of work for his age.  He says that recently he has been a little more fatigued with working and is noted to be bradycardic today.  Heart rate in the past has been in the 60s however today was 42.  He is on both diltiazem and metoprolol.  Recently his INR was slightly supratherapeutic and he will be rechecked again today.  12/20/2017  Ross Lewis is seen today in follow-up.  Unfortunately had a syncopal episode over the summer.  He was orthostatic when he presented to the ER.  He did undergo additional work-up  including monitoring and carotid Dopplers which showed only mild bilateral carotid artery disease.  His monitor showed an episode of which appeared to be rapid atrial flutter however it was likely artifact.  Otherwise he has underlying chronic atrial fibrillation.  He is on warfarin and INRs have been therapeutic.  He has had no further episodes.  Of note he has had a history of bradycardia but there was no evidence of any significant bradycardia or pauses  while monitoring.  Today heart rate is higher at 61.  He is also on tamsulosin.  This is helpful for his prostate but obviously could worsen his syncope.  Since he has had no further episodes we will continue it but I would have a low threshold of stopping it if he were to have further episodes.  PMHx:  Past Medical History:  Diagnosis Date  . Atrial fibrillation ([name])   . Chronic back pain   . CKD (chronic kidney disease), stage II   . Hypertension   . Nephrolithiasis   . Stroke [facility])    right cerebellar infarct  . TIA (transient ischemic attack)     Past Surgical History:  Procedure Laterality Date  . CARDIAC CATHETERIZATION    . CARDIOVERSION  11/01/2002   normal L main, normal LAD, L Cfx normal & nondominant; RCA normal & dominant (Ross Lewis)  . TONSILLECTOMY    . TRANSTHORACIC ECHOCARDIOGRAM  04/2012   EF 50-65%, LA mod-severely dilated; RV mildly dilated; RA mod dilated    FAMHx:  Family History  Problem Relation Age of Onset  . Heart attack Father   . Coronary artery disease Brother     SOCHx:   reports that he has never smoked. He has never used smokeless tobacco. He reports that he does not drink alcohol or use drugs.  ALLERGIES:  No Known Allergies  ROS: Pertinent items noted in HPI and remainder of comprehensive ROS otherwise negative.  HOME MEDS: Current Outpatient Medications  Medication Sig Dispense Refill  . diltiazem (CARTIA XT) 120 MG 24 hr capsule Take 1 capsule (120 mg total) by mouth daily. 90 capsule 3  . metoprolol succinate (TOPROL-XL) 25 MG 24 hr tablet Take 0.5 tablets (12.5 mg total) by mouth daily. 45 tablet 3  . tamsulosin (FLOMAX) 0.4 MG CAPS capsule Take 1 capsule (0.4 mg total) by mouth every evening. (Patient taking differently: Take 0.4 mg by mouth daily. ) 30 capsule 0  . warfarin (COUMADIN) 5 MG tablet Take 1 to 1.5 tablets by mouth daily or as directed by coumadin clinic (Patient taking differently: Take 5-7.5 mg by mouth See admin  instructions. Take 5 mg by mouth in the morning on Sun/Mon/Tues/Wed/Thurs/Sat and 7.5 mg on Fri) 120 tablet 1   No current facility-administered medications for this visit.     LABS/IMAGING: No results found for this or any previous visit (from the past 48 hour(s)). No results found.  VITALS: BP 132/80 (BP Location: Left Arm, Patient Position: Sitting, Cuff Size: Normal)   Pulse 62   Ht 5\' 8"  (1.727 m)   Wt 188 lb 6.4 oz (85.5 kg)   SpO2 98%   BMI 28.65 kg/m   EXAM: General appearance: alert and no distress Neck: no carotid bruit and no JVD Lungs: clear to auscultation bilaterally Heart: irregularly irregular rhythm Abdomen: soft, non-tender; bowel sounds normal; no masses,  no organomegaly Extremities: extremities normal, atraumatic, no cyanosis or edema Pulses: 2+ and symmetric Skin: Skin color, texture, turgor normal. No rashes or lesions  Neurologic: Grossly normal PSych: Mood, affect normal  EKG: Deferred  ASSESSMENT: 1. Vasovagal syncope 2. Permanent a-fib on warfarin (CHADSVASC 5) 3. History of stroke and TIA 4. Hypertension 5. Kidney stones  PLAN: 1.   Mr. Snee had a recent syncopal episode which is likely vasovagal.  His orthostatics were positive.  He did not been out on a hot day although was working in his garden.  He had no further episodes.  Blood pressure is well controlled.  He does have a history of kidney stones but was not having active pain.  He is on Flomax which certainly could increase his risk of orthostasis.  Since he is had no further events we will continue it for now.  INR is been therapeutic on warfarin.  Blood pressure is well controlled.  No other changes on his medicines today.  Follow-up with me annually or sooner as necessary.  Chrystie Nose, MD, [facility], FACP  [place]  [facility]  Medical Director of the Advanced Lipid Disorders &  Cardiovascular Risk Reduction Clinic Diplomate of the American Board of Clinical  Lipidology Attending Cardiologist  Direct Dial: [phone]  Fax: [phone]  Website:  [email or web address] Nicely Tasneem Cormier 12/20/2017, 9:00 AM

## 2018-01-20 ENCOUNTER — Ambulatory Visit (INDEPENDENT_AMBULATORY_CARE_PROVIDER_SITE_OTHER): Payer: Medicare Other | Admitting: Pharmacist Clinician (PhC)/ Clinical Pharmacy Specialist

## 2018-01-20 DIAGNOSIS — I4891 Unspecified atrial fibrillation: Secondary | ICD-10-CM

## 2018-01-20 DIAGNOSIS — I482 Chronic atrial fibrillation, unspecified: Secondary | ICD-10-CM

## 2018-01-20 DIAGNOSIS — Z7901 Long term (current) use of anticoagulants: Secondary | ICD-10-CM

## 2018-01-20 DIAGNOSIS — Z79899 Other long term (current) drug therapy: Secondary | ICD-10-CM

## 2018-01-20 LAB — POCT INR: INR: 2.6 (ref 2.0–3.0)

## 2018-02-08 DIAGNOSIS — R972 Elevated prostate specific antigen [PSA]: Secondary | ICD-10-CM | POA: Diagnosis not present

## 2018-02-15 DIAGNOSIS — R351 Nocturia: Secondary | ICD-10-CM | POA: Diagnosis not present

## 2018-02-15 DIAGNOSIS — R972 Elevated prostate specific antigen [PSA]: Secondary | ICD-10-CM | POA: Diagnosis not present

## 2018-02-15 DIAGNOSIS — N401 Enlarged prostate with lower urinary tract symptoms: Secondary | ICD-10-CM | POA: Diagnosis not present

## 2018-03-03 ENCOUNTER — Ambulatory Visit (INDEPENDENT_AMBULATORY_CARE_PROVIDER_SITE_OTHER): Payer: Medicare Other | Admitting: Pharmacist Clinician (PhC)/ Clinical Pharmacy Specialist

## 2018-03-03 ENCOUNTER — Encounter (INDEPENDENT_AMBULATORY_CARE_PROVIDER_SITE_OTHER): Payer: Self-pay

## 2018-03-03 DIAGNOSIS — Z79899 Other long term (current) drug therapy: Secondary | ICD-10-CM | POA: Diagnosis not present

## 2018-03-03 DIAGNOSIS — Z7901 Long term (current) use of anticoagulants: Secondary | ICD-10-CM

## 2018-03-03 DIAGNOSIS — I4891 Unspecified atrial fibrillation: Secondary | ICD-10-CM | POA: Diagnosis not present

## 2018-03-03 DIAGNOSIS — I482 Chronic atrial fibrillation, unspecified: Secondary | ICD-10-CM

## 2018-03-03 LAB — POCT INR: INR: 2.7 (ref 2.0–3.0)

## 2018-03-31 ENCOUNTER — Other Ambulatory Visit: Payer: Self-pay | Admitting: Internal Medicine

## 2018-04-14 ENCOUNTER — Ambulatory Visit (INDEPENDENT_AMBULATORY_CARE_PROVIDER_SITE_OTHER): Payer: Medicare Other | Admitting: *Deleted

## 2018-04-14 DIAGNOSIS — Z79899 Other long term (current) drug therapy: Secondary | ICD-10-CM | POA: Diagnosis not present

## 2018-04-14 DIAGNOSIS — I4891 Unspecified atrial fibrillation: Secondary | ICD-10-CM | POA: Diagnosis not present

## 2018-04-14 DIAGNOSIS — I482 Chronic atrial fibrillation, unspecified: Secondary | ICD-10-CM

## 2018-04-14 DIAGNOSIS — Z7901 Long term (current) use of anticoagulants: Secondary | ICD-10-CM

## 2018-04-14 LAB — POCT INR: INR: 3.2 — AB (ref 2.0–3.0)

## 2018-04-14 NOTE — Patient Instructions (Signed)
Description   Continue with 1 tablet daily except 1.5 tablets each Friday.  Repeat INR in 6 weeks

## 2018-05-03 ENCOUNTER — Emergency Department (HOSPITAL_COMMUNITY): Payer: Medicare Other

## 2018-05-03 ENCOUNTER — Inpatient Hospital Stay (HOSPITAL_COMMUNITY)
Admission: EM | Admit: 2018-05-03 | Discharge: 2018-05-06 | DRG: 312 | Disposition: A | Discharge status: Home / Self Care | Payer: Medicare Other | Attending: Internal Medicine | Admitting: Internal Medicine

## 2018-05-03 ENCOUNTER — Observation Stay (HOSPITAL_COMMUNITY): Payer: Medicare Other

## 2018-05-03 ENCOUNTER — Encounter (HOSPITAL_COMMUNITY): Payer: Self-pay | Admitting: Emergency Medicine

## 2018-05-03 ENCOUNTER — Other Ambulatory Visit: Payer: Self-pay

## 2018-05-03 DIAGNOSIS — Z7901 Long term (current) use of anticoagulants: Secondary | ICD-10-CM

## 2018-05-03 DIAGNOSIS — I10 Essential (primary) hypertension: Secondary | ICD-10-CM | POA: Diagnosis present

## 2018-05-03 DIAGNOSIS — N183 Chronic kidney disease, stage 3 (moderate): Secondary | ICD-10-CM | POA: Diagnosis present

## 2018-05-03 DIAGNOSIS — I639 Cerebral infarction, unspecified: Secondary | ICD-10-CM | POA: Diagnosis not present

## 2018-05-03 DIAGNOSIS — Z79899 Other long term (current) drug therapy: Secondary | ICD-10-CM

## 2018-05-03 DIAGNOSIS — I482 Chronic atrial fibrillation, unspecified: Secondary | ICD-10-CM | POA: Diagnosis not present

## 2018-05-03 DIAGNOSIS — Z87442 Personal history of urinary calculi: Secondary | ICD-10-CM

## 2018-05-03 DIAGNOSIS — R569 Unspecified convulsions: Secondary | ICD-10-CM

## 2018-05-03 DIAGNOSIS — Z8249 Family history of ischemic heart disease and other diseases of the circulatory system: Secondary | ICD-10-CM

## 2018-05-03 DIAGNOSIS — G8929 Other chronic pain: Secondary | ICD-10-CM | POA: Diagnosis present

## 2018-05-03 DIAGNOSIS — R4701 Aphasia: Secondary | ICD-10-CM | POA: Diagnosis present

## 2018-05-03 DIAGNOSIS — N1831 Chronic kidney disease, stage 3a: Secondary | ICD-10-CM | POA: Diagnosis present

## 2018-05-03 DIAGNOSIS — I129 Hypertensive chronic kidney disease with stage 1 through stage 4 chronic kidney disease, or unspecified chronic kidney disease: Secondary | ICD-10-CM | POA: Diagnosis present

## 2018-05-03 DIAGNOSIS — R55 Syncope and collapse: Principal | ICD-10-CM | POA: Diagnosis present

## 2018-05-03 DIAGNOSIS — R29700 NIHSS score 0: Secondary | ICD-10-CM | POA: Diagnosis present

## 2018-05-03 DIAGNOSIS — R410 Disorientation, unspecified: Secondary | ICD-10-CM

## 2018-05-03 DIAGNOSIS — G8191 Hemiplegia, unspecified affecting right dominant side: Secondary | ICD-10-CM | POA: Diagnosis present

## 2018-05-03 DIAGNOSIS — I69392 Facial weakness following cerebral infarction: Secondary | ICD-10-CM

## 2018-05-03 DIAGNOSIS — R791 Abnormal coagulation profile: Secondary | ICD-10-CM | POA: Diagnosis present

## 2018-05-03 DIAGNOSIS — R4182 Altered mental status, unspecified: Secondary | ICD-10-CM | POA: Diagnosis present

## 2018-05-03 DIAGNOSIS — R001 Bradycardia, unspecified: Secondary | ICD-10-CM | POA: Diagnosis not present

## 2018-05-03 LAB — DIFFERENTIAL
Abs Immature Granulocytes: 0.03 10*3/uL (ref 0.00–0.07)
Basophils Absolute: 0 10*3/uL (ref 0.0–0.1)
Basophils Relative: 1 %
Eosinophils Absolute: 0.1 10*3/uL (ref 0.0–0.5)
Eosinophils Relative: 1 %
Immature Granulocytes: 0 %
LYMPHS ABS: 2.7 10*3/uL (ref 0.7–4.0)
Lymphocytes Relative: 35 %
Monocytes Absolute: 0.7 10*3/uL (ref 0.1–1.0)
Monocytes Relative: 9 %
Neutro Abs: 4 10*3/uL (ref 1.7–7.7)
Neutrophils Relative %: 54 %

## 2018-05-03 LAB — CBC
HCT: 47.9 % (ref 39.0–52.0)
Hemoglobin: 15.1 g/dL (ref 13.0–17.0)
MCH: 31.5 pg (ref 26.0–34.0)
MCHC: 31.5 g/dL (ref 30.0–36.0)
MCV: 99.8 fL (ref 80.0–100.0)
NRBC: 0 % (ref 0.0–0.2)
PLATELETS: 167 10*3/uL (ref 150–400)
RBC: 4.8 MIL/uL (ref 4.22–5.81)
RDW: 13.4 % (ref 11.5–15.5)
WBC: 7.6 10*3/uL (ref 4.0–10.5)

## 2018-05-03 LAB — URINALYSIS, ROUTINE W REFLEX MICROSCOPIC
Bilirubin Urine: NEGATIVE
Glucose, UA: NEGATIVE mg/dL
Ketones, ur: 5 mg/dL — AB
Leukocytes,Ua: NEGATIVE
NITRITE: NEGATIVE
PROTEIN: 30 mg/dL — AB
RBC / HPF: >50 RBC/hpf — ABNORMAL HIGH (ref 0–5)
Specific Gravity, Urine: >1.046 — ABNORMAL HIGH (ref 1.005–1.030)
pH: 7 (ref 5.0–8.0)

## 2018-05-03 LAB — COMPREHENSIVE METABOLIC PANEL
ALBUMIN: 3.6 g/dL (ref 3.5–5.0)
ALT: 17 U/L (ref 0–44)
ANION GAP: 8 (ref 5–15)
AST: 20 U/L (ref 15–41)
Alkaline Phosphatase: 42 U/L (ref 38–126)
BUN: 17 mg/dL (ref 8–23)
CO2: 22 mmol/L (ref 22–32)
Calcium: 9.4 mg/dL (ref 8.9–10.3)
Chloride: 111 mmol/L (ref 98–111)
Creatinine, Ser: 1.31 mg/dL — ABNORMAL HIGH (ref 0.61–1.24)
GFR calc Af Amer: 60 mL/min — ABNORMAL LOW (ref >60)
GFR calc non Af Amer: 51 mL/min — ABNORMAL LOW (ref >60)
GLUCOSE: 126 mg/dL — AB (ref 70–99)
Potassium: 4.3 mmol/L (ref 3.5–5.1)
Sodium: 141 mmol/L (ref 135–145)
Total Bilirubin: 1.5 mg/dL — ABNORMAL HIGH (ref 0.3–1.2)
Total Protein: 6.6 g/dL (ref 6.5–8.1)

## 2018-05-03 LAB — RAPID URINE DRUG SCREEN, HOSP PERFORMED
AMPHETAMINES: NOT DETECTED
BENZODIAZEPINES: NOT DETECTED
Barbiturates: NOT DETECTED
Cocaine: NOT DETECTED
Opiates: NOT DETECTED
Tetrahydrocannabinol: NOT DETECTED

## 2018-05-03 LAB — TSH: TSH: 1.889 u[IU]/mL (ref 0.350–4.500)

## 2018-05-03 LAB — ETHANOL: Alcohol, Ethyl (B): <10 mg/dL (ref <10)

## 2018-05-03 LAB — APTT: aPTT: 32 seconds (ref 24–36)

## 2018-05-03 LAB — PROTIME-INR
INR: 1.7 — AB (ref 0.8–1.2)
Prothrombin Time: 20.1 seconds — ABNORMAL HIGH (ref 11.4–15.2)

## 2018-05-03 LAB — CBG MONITORING, ED: Glucose-Capillary: 122 mg/dL — ABNORMAL HIGH (ref 70–99)

## 2018-05-03 LAB — HEMOGLOBIN A1C
Hgb A1c MFr Bld: 5.8 % — ABNORMAL HIGH (ref 4.8–5.6)
Mean Plasma Glucose: 119.76 mg/dL

## 2018-05-03 MED ORDER — ACETAMINOPHEN 160 MG/5ML PO SOLN: 650.0000 mg | ORAL | Status: DC | PRN

## 2018-05-03 MED ORDER — IOHEXOL 350 MG/ML SOLN
100.0000 mL | Freq: Once | INTRAVENOUS | Status: AC | PRN
  Administered 2018-05-03: 100 mL via INTRAVENOUS

## 2018-05-03 MED ORDER — TAMSULOSIN HCL 0.4 MG PO CAPS
0.4000 mg | ORAL_CAPSULE | Freq: Every day | ORAL | Status: DC
  Administered 2018-05-04 – 2018-05-06 (×3): 0.4 mg via ORAL
  Filled 2018-05-03 (×3): qty 1

## 2018-05-03 MED ORDER — SODIUM CHLORIDE 0.9 % IV SOLN
INTRAVENOUS | Status: DC
  Administered 2018-05-05 (×2): via INTRAVENOUS

## 2018-05-03 MED ORDER — ACETAMINOPHEN 325 MG PO TABS
650.0000 mg | ORAL_TABLET | ORAL | Status: DC | PRN
  Filled 2018-05-03: qty 2

## 2018-05-03 MED ORDER — STROKE: EARLY STAGES OF RECOVERY BOOK: Freq: Once | Status: DC

## 2018-05-03 MED ORDER — ACETAMINOPHEN 650 MG RE SUPP: 650.0000 mg | RECTAL | Status: DC | PRN

## 2018-05-03 MED ORDER — IOHEXOL 350 MG/ML SOLN: 75.0000 mL | Freq: Once | INTRAVENOUS | Status: DC | PRN

## 2018-05-03 MED ORDER — SENNOSIDES-DOCUSATE SODIUM 8.6-50 MG PO TABS: 1.0000 | ORAL_TABLET | Freq: Every evening | ORAL | Status: DC | PRN

## 2018-05-03 MED ORDER — WARFARIN SODIUM 7.5 MG PO TABS
7.5000 mg | ORAL_TABLET | Freq: Once | ORAL | Status: DC
  Filled 2018-05-03: qty 1

## 2018-05-03 MED ORDER — METOPROLOL SUCCINATE ER 25 MG PO TB24
12.5000 mg | ORAL_TABLET | Freq: Every day | ORAL | Status: DC
  Filled 2018-05-03: qty 1

## 2018-05-03 MED ORDER — WARFARIN - PHARMACIST DOSING INPATIENT
Freq: Every day | Status: DC
  Administered 2018-05-04 – 2018-05-05 (×2)

## 2018-05-03 MED ORDER — DILTIAZEM HCL ER COATED BEADS 120 MG PO CP24
120.0000 mg | ORAL_CAPSULE | Freq: Every day | ORAL | Status: DC
  Administered 2018-05-04 – 2018-05-06 (×3): 120 mg via ORAL
  Filled 2018-05-03 (×5): qty 1

## 2018-05-03 NOTE — ED Notes (Signed)
Gave patient sprite to drink as requested.   

## 2018-05-03 NOTE — ED Notes (Signed)
PT assessed by EDP at bedside at this time

## 2018-05-03 NOTE — Consult Note (Signed)
[facility] Tele[facility]urology Consult Services   Date of Service:   05/03/2018 12:26:51  Impression:     .  Rule Out Acute Ischemic Stroke     .  Left Hemispheric Infarct     .  MCA Distribution Infarct     .  Transient Ischemic Attack     .  vs. seizure. Must favor the former given focality reported by EMS in setting of subtherapeutic INR with afib. However, story reported by son and patient lack of any recollection of the event and ride in ambulance may suggest unwitnessed seizure with postictal state/Todd's paralysis.  Comments/Sign-Out: Although he is still within TPA window this is contraindicated due to PT > 15. LVO has been excluded on CTA head/neck.  Metrics: Last Known Well: 05/03/2018 11:20:00 [facility] Notification Time: 05/03/2018 12:26:51 Arrival Time: 05/03/2018 12:05:00 Stamp Time: 05/03/2018 12:26:51 Time First Login Attempt: 05/03/2018 12:32:00 Video Start Time: 05/03/2018 12:40:09  Symptoms: unresponsive NIHSS Start Assessment Time: 05/03/2018 12:47:40 Patient is not a candidate for tPA. Patient was not deemed candidate for tPA thrombolytics because of Coagulopathy.  CT head showed no acute hemorrhage or acute core infarct.  Lower Likelihood of Large Vessel Occlusion but Following Stat Studies are Recommended  CTA Head and Neck. Reviewed, No Indication of Large Vessel Occlusive Thrombus, Patient is not an NIR Candidate.   Our recommendations are outlined below.  Recommendations:     .  Activate Stroke Protocol Admission/Order Set     .  Stroke/Telemetry Floor     .  Neuro Checks     .  Bedside Swallow Eval     .  DVT Prophylaxis     .  IV Fluids, Normal Saline     .  Head of Bed Below 30 Degrees     .  Euglycemia and Avoid Hyperthermia (PRN Acetaminophen)     .  since symptoms resolved, continue coumadin. MRI brain without contrast, if no infarct consider bridge with heparin until INR therapeutic. EEG  Routine Consultation with  Inhouse Neurology for Follow up Care  Sign Out:     .  Discussed with Emergency Department Provider    ------------------------------------------------------------------------------  History of Present Illness: Patient is a 78 year old Male.  Patient was brought by EMS for symptoms of unresponsive  (hx per son) with prior stroke with residual left face weakness was brought to ED after son found him unresponsive/slumped over. He was then noted to have right side weakness, aphasia in ED but this has dramatically improved. Patient appear to have be a limited historian due to lack of recall. He recalls working on car, then walked up to son, went in house to make a phone call and next recalls being woken up by EMS.   Son at bedside saw patient slumped over in chair and was unresponsive to pain for about . There was mild shaking of both feet and right UE, tongue hanging right side of mouth. When he came to he did not talk much and just grunted. He started talking en route to ambulate.   Per nursing EMS reported right side weakness/aphasia that resolved upon arrival in ED.EMS not available for me to clarify this now.   Patient has no physical complaints presently. He denies chest/low back pain.  Patient doesn't recall ambulant ride. He is on coumadin for afib with INR 1.7.  CT head showed no acute hemorrhage or acute core infarct but chronic right cerebellar infarct  CTA head/neck:  1. Negative for emergent  large vessel occlusion. 2. Approximately 25% diameter stenosis proximal left internal carotid artery. Right carotid artery widely patent. Mild stenosis origin of right vertebral artery 3. Moderate stenosis right P1 segment and P2 segments. Mild stenosis left MCA bifurcation.   Examination: BP(122/81), Blood Glucose(123) 1A: Level of Consciousness - Alert; keenly responsive + 0 1B: Ask Month and Age - Both Questions Right + 0 1C: Blink Eyes & Squeeze Hands - Performs Both  Tasks + 0 2: Test Horizontal Extraocular Movements - Normal + 0 3: Test Visual Fields - No Visual Loss + 0 4: Test Facial Palsy (Use Grimace if Obtunded) - Normal symmetry + 0 5A: Test Left Arm Motor Drift - No Drift for 10 Seconds + 0 5B: Test Right Arm Motor Drift - No Drift for 10 Seconds + 0 6A: Test Left Leg Motor Drift - No Drift for 5 Seconds + 0 6B: Test Right Leg Motor Drift - No Drift for 5 Seconds + 0 7: Test Limb Ataxia (FNF/Heel-Shin) - No Ataxia + 0 8: Test Sensation - Normal; No sensory loss + 0 9: Test Language/Aphasia - Normal; No aphasia + 0 10: Test Dysarthria - Normal + 0 11: Test Extinction/Inattention - No abnormality + 0  NIHSS Score: 0  Patient was informed the Neurology Consult would happen via TeleHealth consult by way of interactive audio and video telecommunications and consented to receiving care in this manner.  Due to the immediate potential for life-threatening deterioration due to underlying acute neurologic illness, I spent 35 minutes providing critical care. This time includes time for face to face visit via telemedicine, review of medical records, imaging studies and discussion of findings with providers, the patient and/or family.   Dr Bertell Maria   [facility] [phone]   Case [id number]

## 2018-05-03 NOTE — ED Notes (Signed)
cbg 122 

## 2018-05-03 NOTE — Progress Notes (Signed)
CODE STROKE 1206 CALL TIME 1159 BEEPER 1214 EXAM STARTED 1215 EXAM FINISHED 1215 IMAGES SENT TO SOC 1221 EXAM COMPLETED 1221 [facility] CALLED.

## 2018-05-03 NOTE — ED Triage Notes (Signed)
[facility] reports pt last well known by his SON around 1145 today and noticed at 1200 pt was unable to speak and was hard to arouse. On arrival to ED pt was diaphoretic but was able to speak and was alert and oriented and breathing 100% sats on room air. PT taken to CT by nursing staff at 1213. Blood handed to Lab tech at 1211 with IV insertion at bedside.

## 2018-05-03 NOTE — ED Notes (Signed)
Patient transported to CT 

## 2018-05-03 NOTE — H&P (Signed)
History and Physical  Ross Lewis EXB:[id number] DOB: [birth year] DOA: 05/03/2018  Referring physician: Rubin Payor PCP: [name], MD   Chief Complaint: Unresponsiveness   HPI: Ross Lewis is a 78 y.o. male with chronic atrial fibrillation anticoagulated with warfarin presented to the emergency department by EMS as a code stroke.  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  The patient was found sitting in a chair after her son had left him for a few minutes and returned to find him shaking of both feet and right upper extremity and unresponsive with his tongue sticking out of the right side of the mouth.  The patient was grunting excessively.Marland Kitchen  He also had some right-sided weakness that resolved.  He was having symptoms of expressive aphasia.  He was able to nod yes or no to commands.  His neurological deficits resolved by the time he arrived.  The patient has no recall of his ambulance ride to the hospital.  Reportedly the patient has had multiple TIAs in the past in addition to a prior CVA.  The patient does not have a history of prior seizures.  On arrival he was evaluated by teleneurology.  Head showed no acute hemorrhage or infarct.  CTA head and neck with no indication of large vessel occlusive thrombus and was not deemed an NIR candidate.  Although he was still within TPA window it was determined to be contraindicated due to PT greater than LVO has been excluded on CTA head/neck.  MRI brain recommended given the focality of's patient's symptoms reported in the setting of subtherapeutic INR with chronic atrial fibrillation.  His INR was determined to be 1.7.  Also there was consideration of possible unwitnessed seizure and associated postictal state.  The patient is being admitted for further management and neurology consultation.  Review of Systems:  Constitutional: Negative for appetite change.    HENT: Negative for congestion.   Respiratory: Negative for shortness of breath.   Cardiovascular: Negative for chest pain.  Gastrointestinal: Negative for abdominal pain.  Genitourinary: Negative for flank pain.  Musculoskeletal: Negative for back pain.  Neurological: Positive for speech difficulty.  Hematological: Negative for adenopathy.  Psychiatric/Behavioral: Negative for confusion.  All systems reviewed and apart from history of presenting illness, are negative.  Past Medical History:  Diagnosis Date  . Atrial fibrillation ([name])   . Chronic back pain   . CKD (chronic kidney disease), stage II   . Hypertension   . Nephrolithiasis   . Stroke [facility])    right cerebellar infarct  . TIA (transient ischemic attack)    Past Surgical History:  Procedure Laterality Date  . CARDIAC CATHETERIZATION    . CARDIOVERSION  11/01/2002   normal L main, normal LAD, L Cfx normal & nondominant; RCA normal & dominant (Dr. Erlene Quan)  . TONSILLECTOMY    . TRANSTHORACIC ECHOCARDIOGRAM  04/2012   EF 50-65%, LA mod-severely dilated; RV mildly dilated; RA mod dilated   Social History:  reports that he has never smoked. He has never used smokeless tobacco. He reports that he does not drink alcohol or use drugs.  No Known Allergies  Family History  Problem Relation Age of Onset  . Heart attack Father   . Coronary artery disease Brother     Prior to Admission medications   Medication Sig Start Date End Date Taking? Authorizing Provider  diltiazem (CARTIA XT) 120 MG  24 hr capsule Take 1 capsule (120 mg total) by mouth daily. 06/24/17   Hilty, Lisette Abu, MD  metoprolol succinate (TOPROL-XL) 25 MG 24 hr tablet Take 0.5 tablets (12.5 mg total) by mouth daily. 06/24/17   Hilty, Lisette Abu, MD  tamsulosin (FLOMAX) 0.4 MG CAPS capsule Take 1 capsule (0.4 mg total) by mouth every evening. Patient taking differently: Take 0.4 mg by mouth daily.  06/21/15   Darron Doom, MD  warfarin (JANTOVEN) 5 MG tablet  TAKE ONE AND ONE-HALF TABLETS BY MOUTH DAILY OR AS DIRECTED BY COUMADIN CLINIC 03/31/18   Chrystie Nose, MD   Physical Exam: Vitals:   05/03/18 1225 05/03/18 1230 05/03/18 1245 05/03/18 1300  BP:  129/75 122/81 125/72  Pulse:  (!) 58 69 67  Resp:  10 15 19   Temp:      TempSrc:      SpO2: 97% 99% 99% 100%  Weight:      Height:         General exam: Moderately built and nourished patient, lying comfortably supine on the gurney in no obvious distress.  Head, eyes and ENT: Nontraumatic and normocephalic. Pupils equally reacting to light and accommodation. Oral mucosa moist.  Small right side tongue lesion noted, erythema seen.   Neck: Supple. No JVD, carotid bruit or thyromegaly.  Lymphatics: No lymphadenopathy.  Respiratory system: Clear to auscultation. No increased work of breathing.  Cardiovascular system: S1 and S2 heard, RRR. No JVD, murmurs, gallops, clicks or pedal edema.  Gastrointestinal system: Abdomen is nondistended, soft and nontender. Normal bowel sounds heard. No organomegaly or masses appreciated.  Central nervous system: Alert and oriented. No focal neurological deficits.  Extremities: Symmetric 5 x 5 power. Peripheral pulses symmetrically felt.   Skin: No rashes or acute findings.  Musculoskeletal system: Negative exam.  Psychiatry: Pleasant and cooperative.  Labs on Admission:  Basic Metabolic Panel: Recent Labs  Lab 05/03/18 1214  NA 141  K 4.3  CL 111  CO2 22  GLUCOSE 126*  BUN 17  CREATININE 1.31*  CALCIUM 9.4   Liver Function Tests: Recent Labs  Lab 05/03/18 1214  AST 20  ALT 17  ALKPHOS 42  BILITOT 1.5*  PROT 6.6  ALBUMIN 3.6   No results for input(s): LIPASE, AMYLASE in the last 168 hours. No results for input(s): AMMONIA in the last 168 hours. CBC: Recent Labs  Lab 05/03/18 1214  WBC 7.6  NEUTROABS 4.0  HGB 15.1  HCT 47.9  MCV 99.8  PLT 167   Cardiac Enzymes: No results for input(s): CKTOTAL, CKMB, CKMBINDEX,  TROPONINI in the last 168 hours.  BNP (last 3 results) No results for input(s): PROBNP in the last 8760 hours. CBG: Recent Labs  Lab 05/03/18 1208  GLUCAP 122*    Radiological Exams on Admission: Ct Angio Head W Or Wo Contrast  Result Date: 05/03/2018 CLINICAL DATA:  Code stroke.  Aphasia and diaphoresis EXAM: CT ANGIOGRAPHY HEAD AND NECK TECHNIQUE: Multidetector CT imaging of the head and neck was performed using the standard protocol during bolus administration of intravenous contrast. Multiplanar CT image reconstructions and MIPs were obtained to evaluate the vascular anatomy. Carotid stenosis measurements (when applicable) are obtained utilizing NASCET criteria, using the distal internal carotid diameter as the denominator. CONTRAST:  OMNIPAQUE IOHEXOL 350 MG/ML SOLN COMPARISON:  CT head 05/23/2018 FINDINGS: CTA NECK FINDINGS Aortic arch: Mild atherosclerotic disease aortic arch. Proximal great vessels widely patent. Right carotid system: Mild atherosclerotic disease right carotid bifurcation without  significant stenosis. Left carotid system: Atherosclerotic disease left carotid bifurcation. Approximately 25% diameter stenosis proximal left internal carotid artery due to calcific disease. Vertebral arteries: Both vertebral arteries patent to the basilar. Mild atherosclerotic disease at the origin of the right vertebral artery with mild stenosis. Skeleton: No acute skeletal abnormality. Mild degenerative changes in the cervical spine. Other neck: Negative Upper chest: Lung apices clear bilaterally. Review of the MIP images confirms the above findings CTA HEAD FINDINGS Anterior circulation: Mild atherosclerotic disease in the cavernous carotid bilaterally without stenosis. Anterior and middle cerebral arteries patent without large vessel occlusion. Mild stenosis left MCA bifurcation. Posterior circulation: Both vertebral arteries patent to the basilar. Basilar widely patent. PICA, superior  cerebellar, and posterior cerebral arteries patent bilaterally. Moderate stenosis right P1 and P2 segments. Left posterior cerebral artery patent without stenosis. Venous sinuses: Patent Anatomic variants: None Delayed phase: No enhancing lesions postcontrast infusion. Review of the MIP images confirms the above findings IMPRESSION: 1. Negative for emergent large vessel occlusion. 2. Approximately 25% diameter stenosis proximal left internal carotid artery. Right carotid artery widely patent. Mild stenosis origin of right vertebral artery 3. Moderate stenosis right P1 segment and P2 segments. Mild stenosis left MCA bifurcation. 4. These results were called by telephone at the time of interpretation on 05/03/2018 at 12:50 pm to Dr. Benjiman Core , who verbally acknowledged these results. Electronically Signed   By: Marlan Palau M.D.   On: 05/03/2018 12:51   Ct Angio Neck W And/or Wo Contrast  Result Date: 05/03/2018 CLINICAL DATA:  Code stroke.  Aphasia and diaphoresis EXAM: CT ANGIOGRAPHY HEAD AND NECK TECHNIQUE: Multidetector CT imaging of the head and neck was performed using the standard protocol during bolus administration of intravenous contrast. Multiplanar CT image reconstructions and MIPs were obtained to evaluate the vascular anatomy. Carotid stenosis measurements (when applicable) are obtained utilizing NASCET criteria, using the distal internal carotid diameter as the denominator. CONTRAST:  OMNIPAQUE IOHEXOL 350 MG/ML SOLN COMPARISON:  CT head 05/23/2018 FINDINGS: CTA NECK FINDINGS Aortic arch: Mild atherosclerotic disease aortic arch. Proximal great vessels widely patent. Right carotid system: Mild atherosclerotic disease right carotid bifurcation without significant stenosis. Left carotid system: Atherosclerotic disease left carotid bifurcation. Approximately 25% diameter stenosis proximal left internal carotid artery due to calcific disease. Vertebral arteries: Both vertebral arteries  patent to the basilar. Mild atherosclerotic disease at the origin of the right vertebral artery with mild stenosis. Skeleton: No acute skeletal abnormality. Mild degenerative changes in the cervical spine. Other neck: Negative Upper chest: Lung apices clear bilaterally. Review of the MIP images confirms the above findings CTA HEAD FINDINGS Anterior circulation: Mild atherosclerotic disease in the cavernous carotid bilaterally without stenosis. Anterior and middle cerebral arteries patent without large vessel occlusion. Mild stenosis left MCA bifurcation. Posterior circulation: Both vertebral arteries patent to the basilar. Basilar widely patent. PICA, superior cerebellar, and posterior cerebral arteries patent bilaterally. Moderate stenosis right P1 and P2 segments. Left posterior cerebral artery patent without stenosis. Venous sinuses: Patent Anatomic variants: None Delayed phase: No enhancing lesions postcontrast infusion. Review of the MIP images confirms the above findings IMPRESSION: 1. Negative for emergent large vessel occlusion. 2. Approximately 25% diameter stenosis proximal left internal carotid artery. Right carotid artery widely patent. Mild stenosis origin of right vertebral artery 3. Moderate stenosis right P1 segment and P2 segments. Mild stenosis left MCA bifurcation. 4. These results were called by telephone at the time of interpretation on 05/03/2018 at 12:50 pm to Dr. Benjiman Core , who  verbally acknowledged these results. Electronically Signed   By: Marlan Palau M.D.   On: 05/03/2018 12:51   Ct Head Code Stroke Wo Contrast  Result Date: 05/03/2018 CLINICAL DATA:  Code stroke. 78 year old male with aphasia and diaphoresis last known well 30 minutes ago. EXAM: CT HEAD WITHOUT CONTRAST TECHNIQUE: Contiguous axial images were obtained from the base of the skull through the vertex without intravenous contrast. COMPARISON:  Head CT 10/19/2017 and earlier. FINDINGS: Brain: Small chronic infarct  in the posterior right cerebellum is stable. Mild for age patchy white matter hypodensity. No midline shift, ventriculomegaly, mass effect, evidence of mass lesion, intracranial hemorrhage or evidence of cortically based acute infarction. Vascular: Calcified atherosclerosis at the skull base. No suspicious intracranial vascular hyperdensity. Skull: Negative. Sinuses/Orbits: Chronic left maxillary sinus disease. Other Visualized paranasal sinuses and mastoids are stable and well pneumatized. Other: No acute orbit or scalp soft tissue findings. ASPECTS [facility] Stroke Program Early CT Score) - Ganglionic level infarction (caudate, lentiform nuclei, internal capsule, insula, M1-M3 cortex): 7 - Supraganglionic infarction (M4-M6 cortex): 3 Total score (0-10 with 10 being normal): 10 IMPRESSION: 1. No acute cortically based infarct or acute intracranial hemorrhage identified. ASPECTS is 10. 2. Small chronic right cerebellar infarct. 3. Study discussed by telephone with Dr. Benjiman Core on 05/03/2018 at 12:27 . Electronically Signed   By: Odessa Fleming M.D.   On: 05/03/2018 12:27    EKG: Independently reviewed. Atrial fibrillation  Assessment/Plan Principal Problem:   Acute CVA (cerebrovascular accident) ([name]) Active Problems:   Chronic a-fib   Essential hypertension   Long term current use of anticoagulant therapy   Anticoagulated on Coumadin   Altered mental state   Subtherapeutic international normalized ratio (INR)   Chronic kidney disease (CKD) stage G3a/A1, moderately decreased glomerular filtration rate (GFR) between 45-59 mL/min/1.73 square meter and albuminuria creatinine ratio less than 30 mg/g ([name])  1. Acute CVA-presumptive diagnosis based on clinical history and exam findings.  Place in observation, MRI brain ordered.  Neurochecks ordered.  Check lipid panel.  Check A1c.  If MRI does not show acute infarct neurology had recommended considering IV heparin until INR therapeutic.  Requested inpatient  neurology consultation.  Order EEG, echocardiogram, carotid Dopplers.  Warfarin dosing per pharmacy requested.  Follow daily PT/INR.  PT/OT evaluation as part of stroke work-up. 2. Essential hypertension- allow permissive hypertension. 3. Chronic atrial fibrillation- warfarin for anticoagulation, rate currently controlled. 4. Possible epileptic seizure- EEG has been ordered.  Neuro consult requested. 5. CKD stage III- gently hydrate with IV fluids, recheck in a.m.  DVT Prophylaxis: Warfarin/SCDs Code Status: Full Family Communication: Son Disposition Plan: OBS for stroke work-up  Time spent: 59 minutes  Standley Dakins, MD [facility] How to contact the [facility] Attending or Consulting provider 7A - 7P or covering provider during after hours 7P -7A, for this patient?  1. Check the care team in [facility] and look for a) attending/consulting [facility] provider listed and b) the [facility] team listed 2. Log into [email or web address] and use [facility]'s universal password to access. If you do not have the password, please contact the hospital operator. 3. Locate the [facility] provider you are looking for under [facility] and page to a number that you can be directly reached. 4. If you still have difficulty reaching the provider, please page the [facility] (Director on Call) for the Hospitalists listed on amion for assistance.

## 2018-05-03 NOTE — ED Provider Notes (Signed)
[facility] EMERGENCY DEPARTMENT Provider Note   CSN: [id number] Arrival date & time: 05/03/18  1205  An emergency department physician performed an initial assessment on this suspected stroke patient at 60.  History   Chief Complaint Chief Complaint  Patient presents with  . Code Stroke    HPI Ross Lewis is a 78 y.o. male.     HPI Patient came in as a code stroke called by EMS.  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  His son left and came back and found him sitting back in a chair.  Son not here but later had told stroke neurologist that there may have been some shaking.  Tongue may be sticking out.  For EMS reportedly patient had expressive aphasia.  Would nod yes or no and follow commands but would not speak.  Upon arrival for me he will speak and appears to have no neuro deficits.  Reported last normal was around 30 minutes prior to arrival.  He already is on Coumadin for atrial fibrillation and reportedly had TIAs in the past along with the previous stroke. Past Medical History:  Diagnosis Date  . Atrial fibrillation ([name])   . Chronic back pain   . CKD (chronic kidney disease), stage II   . Hypertension   . Nephrolithiasis   . Stroke [facility])    right cerebellar infarct  . TIA (transient ischemic attack)     Patient Active Problem List   Diagnosis Date Noted  . Symptomatic bradycardia 06/24/2017  . Acute on chronic kidney failure-II 06/02/2015  . Hydronephrosis of left kidney 06/02/2015  . Kidney stone on left side 06/02/2015  . Nephrolithiasis   . Anticoagulated on Coumadin   . Long term current use of anticoagulant therapy 05/17/2012  . TIA (transient ischemic attack) 05/02/2012  . Chronic a-fib 05/02/2012  . Lumbago without sciatica 05/02/2012  . Essential hypertension 05/02/2012    Past Surgical History:  Procedure Laterality Date  . CARDIAC CATHETERIZATION    . CARDIOVERSION  11/01/2002   normal L main, normal LAD, L Cfx normal &  nondominant; RCA normal & dominant (Dr. Erlene Quan)  . TONSILLECTOMY    . TRANSTHORACIC ECHOCARDIOGRAM  04/2012   EF 50-65%, LA mod-severely dilated; RV mildly dilated; RA mod dilated        Home Medications    Prior to Admission medications   Medication Sig Start Date End Date Taking? Authorizing Provider  diltiazem (CARTIA XT) 120 MG 24 hr capsule Take 1 capsule (120 mg total) by mouth daily. 06/24/17   Hilty, Lisette Abu, MD  metoprolol succinate (TOPROL-XL) 25 MG 24 hr tablet Take 0.5 tablets (12.5 mg total) by mouth daily. 06/24/17   Hilty, Lisette Abu, MD  tamsulosin (FLOMAX) 0.4 MG CAPS capsule Take 1 capsule (0.4 mg total) by mouth every evening. Patient taking differently: Take 0.4 mg by mouth daily.  06/21/15   Darron Doom, MD  warfarin (JANTOVEN) 5 MG tablet TAKE ONE AND ONE-HALF TABLETS BY MOUTH DAILY OR AS DIRECTED BY COUMADIN CLINIC 03/31/18   Hilty, Lisette Abu, MD    Family History Family History  Problem Relation Age of Onset  . Heart attack Father   . Coronary artery disease Brother     Social History Social History   Tobacco Use  . Smoking status: Never Smoker  . Smokeless tobacco: Never Used  Substance Use Topics  . Alcohol use: No  . Drug use: No     Allergies  Patient has no known allergies.   Review of Systems Review of Systems  Constitutional: Negative for appetite change.  HENT: Negative for congestion.   Respiratory: Negative for shortness of breath.   Cardiovascular: Negative for chest pain.  Gastrointestinal: Negative for abdominal pain.  Genitourinary: Negative for flank pain.  Musculoskeletal: Negative for back pain.  Neurological: Positive for speech difficulty.  Hematological: Negative for adenopathy.  Psychiatric/Behavioral: Negative for confusion.     Physical Exam Updated Vital Signs BP 125/72   Pulse 67   Temp 97.6 F (36.4 C) (Oral)   Resp 19   Ht 5\' 8"  (1.727 m)   Wt 99.8 kg   SpO2 100%   BMI 33.45 kg/m   Physical  Exam HENT:     Head: Atraumatic.     Comments: Mild area of erythema on right lateral aspect of tongue. Eyes:     Extraocular Movements: Extraocular movements intact.  Neck:     Musculoskeletal: Neck supple.  Cardiovascular:     Rate and Rhythm: Normal rate.  Pulmonary:     Breath sounds: No wheezing, rhonchi or rales.  Abdominal:     Tenderness: There is no abdominal tenderness.  Skin:    General: Skin is warm.     Capillary Refill: Capillary refill takes less than 2 seconds.  Neurological:     General: No focal deficit present.     Mental Status: He is alert and oriented to person, place, and time.     Comments: Awake and appropriate.  Moving all extremities.  Grip strength intact bilaterally.  Face symmetric.  Good eye opening and closing.  Complete NIH scoring done by neurology.  Psychiatric:        Mood and Affect: Mood normal.      ED Treatments / Results  Labs (all labs ordered are listed, but only abnormal results are displayed) Labs Reviewed  PROTIME-INR - Abnormal; Notable for the following components:      Result Value   Prothrombin Time 20.1 (*)    INR 1.7 (*)    All other components within normal limits  COMPREHENSIVE METABOLIC PANEL - Abnormal; Notable for the following components:   Glucose, Bld 126 (*)    Creatinine, Ser 1.31 (*)    Total Bilirubin 1.5 (*)    GFR calc non Af Amer 51 (*)    GFR calc Af Amer 60 (*)    All other components within normal limits  CBG MONITORING, ED - Abnormal; Notable for the following components:   Glucose-Capillary 122 (*)    All other components within normal limits  ETHANOL  APTT  CBC  DIFFERENTIAL  RAPID URINE DRUG SCREEN, HOSP PERFORMED  URINALYSIS, ROUTINE W REFLEX MICROSCOPIC  I-STAT CREATININE, ED    EKG EKG Interpretation  Date/Time:  Wednesday May 03 2018 12:08:44 EST Ventricular Rate:  73 PR Interval:    QRS Duration: 92 QT Interval:  426 QTC Calculation: 470 R Axis:   11 Text Interpretation:   Atrial fibrillation Minimal ST depression, anterolateral leads Confirmed by Benjiman CorePickering, Neela Zecca [phone]([id number]) on 05/03/2018 12:12:21 PM   Radiology Ct Angio Head W Or Wo Contrast  Result Date: 05/03/2018 CLINICAL DATA:  Code stroke.  Aphasia and diaphoresis EXAM: CT ANGIOGRAPHY HEAD AND NECK TECHNIQUE: Multidetector CT imaging of the head and neck was performed using the standard protocol during bolus administration of intravenous contrast. Multiplanar CT image reconstructions and MIPs were obtained to evaluate the vascular anatomy. Carotid stenosis measurements (when applicable) are obtained utilizing NASCET  criteria, using the distal internal carotid diameter as the denominator. CONTRAST:  OMNIPAQUE IOHEXOL 350 MG/ML SOLN COMPARISON:  CT head 05/23/2018 FINDINGS: CTA NECK FINDINGS Aortic arch: Mild atherosclerotic disease aortic arch. Proximal great vessels widely patent. Right carotid system: Mild atherosclerotic disease right carotid bifurcation without significant stenosis. Left carotid system: Atherosclerotic disease left carotid bifurcation. Approximately 25% diameter stenosis proximal left internal carotid artery due to calcific disease. Vertebral arteries: Both vertebral arteries patent to the basilar. Mild atherosclerotic disease at the origin of the right vertebral artery with mild stenosis. Skeleton: No acute skeletal abnormality. Mild degenerative changes in the cervical spine. Other neck: Negative Upper chest: Lung apices clear bilaterally. Review of the MIP images confirms the above findings CTA HEAD FINDINGS Anterior circulation: Mild atherosclerotic disease in the cavernous carotid bilaterally without stenosis. Anterior and middle cerebral arteries patent without large vessel occlusion. Mild stenosis left MCA bifurcation. Posterior circulation: Both vertebral arteries patent to the basilar. Basilar widely patent. PICA, superior cerebellar, and posterior cerebral arteries patent bilaterally.  Moderate stenosis right P1 and P2 segments. Left posterior cerebral artery patent without stenosis. Venous sinuses: Patent Anatomic variants: None Delayed phase: No enhancing lesions postcontrast infusion. Review of the MIP images confirms the above findings IMPRESSION: 1. Negative for emergent large vessel occlusion. 2. Approximately 25% diameter stenosis proximal left internal carotid artery. Right carotid artery widely patent. Mild stenosis origin of right vertebral artery 3. Moderate stenosis right P1 segment and P2 segments. Mild stenosis left MCA bifurcation. 4. These results were called by telephone at the time of interpretation on 05/03/2018 at 12:50 pm to Dr. Benjiman Core , who verbally acknowledged these results. Electronically Signed   By: Marlan Palau M.D.   On: 05/03/2018 12:51   Ct Angio Neck W And/or Wo Contrast  Result Date: 05/03/2018 CLINICAL DATA:  Code stroke.  Aphasia and diaphoresis EXAM: CT ANGIOGRAPHY HEAD AND NECK TECHNIQUE: Multidetector CT imaging of the head and neck was performed using the standard protocol during bolus administration of intravenous contrast. Multiplanar CT image reconstructions and MIPs were obtained to evaluate the vascular anatomy. Carotid stenosis measurements (when applicable) are obtained utilizing NASCET criteria, using the distal internal carotid diameter as the denominator. CONTRAST:  OMNIPAQUE IOHEXOL 350 MG/ML SOLN COMPARISON:  CT head 05/23/2018 FINDINGS: CTA NECK FINDINGS Aortic arch: Mild atherosclerotic disease aortic arch. Proximal great vessels widely patent. Right carotid system: Mild atherosclerotic disease right carotid bifurcation without significant stenosis. Left carotid system: Atherosclerotic disease left carotid bifurcation. Approximately 25% diameter stenosis proximal left internal carotid artery due to calcific disease. Vertebral arteries: Both vertebral arteries patent to the basilar. Mild atherosclerotic disease at the origin of  the right vertebral artery with mild stenosis. Skeleton: No acute skeletal abnormality. Mild degenerative changes in the cervical spine. Other neck: Negative Upper chest: Lung apices clear bilaterally. Review of the MIP images confirms the above findings CTA HEAD FINDINGS Anterior circulation: Mild atherosclerotic disease in the cavernous carotid bilaterally without stenosis. Anterior and middle cerebral arteries patent without large vessel occlusion. Mild stenosis left MCA bifurcation. Posterior circulation: Both vertebral arteries patent to the basilar. Basilar widely patent. PICA, superior cerebellar, and posterior cerebral arteries patent bilaterally. Moderate stenosis right P1 and P2 segments. Left posterior cerebral artery patent without stenosis. Venous sinuses: Patent Anatomic variants: None Delayed phase: No enhancing lesions postcontrast infusion. Review of the MIP images confirms the above findings IMPRESSION: 1. Negative for emergent large vessel occlusion. 2. Approximately 25% diameter stenosis proximal left internal carotid artery.  Right carotid artery widely patent. Mild stenosis origin of right vertebral artery 3. Moderate stenosis right P1 segment and P2 segments. Mild stenosis left MCA bifurcation. 4. These results were called by telephone at the time of interpretation on 05/03/2018 at 12:50 pm to Dr. Benjiman Core , who verbally acknowledged these results. Electronically Signed   By: Marlan Palau M.D.   On: 05/03/2018 12:51   Ct Head Code Stroke Wo Contrast  Result Date: 05/03/2018 CLINICAL DATA:  Code stroke. 78 year old male with aphasia and diaphoresis last known well 30 minutes ago. EXAM: CT HEAD WITHOUT CONTRAST TECHNIQUE: Contiguous axial images were obtained from the base of the skull through the vertex without intravenous contrast. COMPARISON:  Head CT 10/19/2017 and earlier. FINDINGS: Brain: Small chronic infarct in the posterior right cerebellum is stable. Mild for age patchy white  matter hypodensity. No midline shift, ventriculomegaly, mass effect, evidence of mass lesion, intracranial hemorrhage or evidence of cortically based acute infarction. Vascular: Calcified atherosclerosis at the skull base. No suspicious intracranial vascular hyperdensity. Skull: Negative. Sinuses/Orbits: Chronic left maxillary sinus disease. Other Visualized paranasal sinuses and mastoids are stable and well pneumatized. Other: No acute orbit or scalp soft tissue findings. ASPECTS [facility] Stroke Program Early CT Score) - Ganglionic level infarction (caudate, lentiform nuclei, internal capsule, insula, M1-M3 cortex): 7 - Supraganglionic infarction (M4-M6 cortex): 3 Total score (0-10 with 10 being normal): 10 IMPRESSION: 1. No acute cortically based infarct or acute intracranial hemorrhage identified. ASPECTS is 10. 2. Small chronic right cerebellar infarct. 3. Study discussed by telephone with Dr. Benjiman Core on 05/03/2018 at 12:27 . Electronically Signed   By: Odessa Fleming M.D.   On: 05/03/2018 12:27    Procedures Procedures (including critical care time)  Medications Ordered in ED Medications  iohexol (OMNIPAQUE) 350 MG/ML injection 100 mL (100 mLs Intravenous Contrast Given 05/03/18 1223)     Initial Impression / Assessment and Plan / ED Course  I have reviewed the triage vital signs and the nursing notes.  Pertinent labs & imaging results that were available during my care of the patient were reviewed by me and considered in my medical decision making (see chart for details).        Patient with episode of expressive aphasia.  Reportedly had some right-sided weakness for EMS but upon arrival does have neither a aphasia nor any weakness.  Neurology discussed with son and reported had some shaking.  Reportedly was more unresponsive upon arrival then just a aphasia.  Has had previous strokes so there is a question of a seizure.  There is 1 area of redness on tongue.  Neurology recommended normal  stroke work-up and EEG.  Not a TPA candidate due to 2 resolution of symptoms.  Also patient is on Coumadin.  Will admit to hospitalist.  CRITICAL CARE Performed by: Benjiman Core Total critical care time: 30 minutes Critical care time was exclusive of separately billable procedures and treating other patients. Critical care was necessary to treat or prevent imminent or life-threatening deterioration. Critical care was time spent personally by me on the following activities: development of treatment plan with patient and/or surrogate as well as nursing, discussions with consultants, evaluation of patient's response to treatment, examination of patient, obtaining history from patient or surrogate, ordering and performing treatments and interventions, ordering and review of laboratory studies, ordering and review of radiographic studies, pulse oximetry and re-evaluation of patient's condition.   Final Clinical Impressions(s) / ED Diagnoses   Final diagnoses:  Aphasia  ED Discharge Orders    None       Benjiman Core, MD 05/03/18 [phone]

## 2018-05-03 NOTE — ED Notes (Signed)
Dr. Bertell Maria (neurologist) performing assessment at this time.

## 2018-05-03 NOTE — ED Notes (Addendum)
PT went to CT transported by RN at 1213. PT states he is on Warfrin for hx of Afib and TIA.

## 2018-05-03 NOTE — Progress Notes (Addendum)
ANTICOAGULATION CONSULT NOTE - Initial Consult  Pharmacy Consult for Coumadin Indication: atrial fibrillation  No Known Allergies  Patient Measurements: Height: 5\' 8"  (172.7 cm) Weight: 220 lb (99.8 kg) IBW/kg (Calculated) : 68.4  Vital Signs: Temp: 97.6 F (36.4 C) (03/04 1210) Temp Source: Oral (03/04 1210) BP: 125/72 (03/04 1300) Pulse Rate: 67 (03/04 1300)  Labs: Recent Labs    05/03/18 1214  HGB 15.1  HCT 47.9  PLT 167  APTT 32  LABPROT 20.1*  INR 1.7*  CREATININE 1.31*    Estimated Creatinine Clearance: 52.4 mL/min (A) (by C-G formula based on SCr of 1.31 mg/dL (H)).   Medical History: Past Medical History:  Diagnosis Date  . Atrial fibrillation ([name])   . Chronic back pain   . CKD (chronic kidney disease), stage II   . Hypertension   . Nephrolithiasis   . Stroke [facility])    right cerebellar infarct  . TIA (transient ischemic attack)     Medications:  See med rec  Assessment: 78 yo male on chronic coumadin for afib. He is followed in anticoag clinic. INR is below goal, 1.7.  Current home dose is 5mg  daily except 7.5mg  on Friday  Goal of Therapy:  INR 2.5-3.5 per anticoag clinic Monitor platelets by anticoagulation protocol: Yes   Plan:  Coumadin 7.5mg  po x 1 today PT-INR daily Monitor for s/s of bleeding  Elder Cyphers, BS Loura Back, BCPS Clinical Pharmacist Pager [phone] 05/03/2018,1:43 PM

## 2018-05-03 NOTE — ED Notes (Signed)
Decision to not given TPA at this time.

## 2018-05-03 NOTE — ED Notes (Signed)
Son reports pt was unresponsive with tongue handing out to right side of mouth and some shaking noted from bilateral feet during the time span from calling EMS and EMS arrival to him.

## 2018-05-04 ENCOUNTER — Inpatient Hospital Stay (HOSPITAL_COMMUNITY)
Admit: 2018-05-04 | Discharge: 2018-05-04 | Disposition: A | Discharge status: Home / Self Care | Payer: Medicare Other | Attending: Family Medicine | Admitting: Family Medicine

## 2018-05-04 ENCOUNTER — Observation Stay (HOSPITAL_COMMUNITY): Payer: Medicare Other

## 2018-05-04 DIAGNOSIS — R29700 NIHSS score 0: Secondary | ICD-10-CM | POA: Diagnosis present

## 2018-05-04 DIAGNOSIS — R4701 Aphasia: Secondary | ICD-10-CM | POA: Diagnosis present

## 2018-05-04 DIAGNOSIS — G8929 Other chronic pain: Secondary | ICD-10-CM | POA: Diagnosis present

## 2018-05-04 DIAGNOSIS — R569 Unspecified convulsions: Secondary | ICD-10-CM | POA: Diagnosis not present

## 2018-05-04 DIAGNOSIS — R791 Abnormal coagulation profile: Secondary | ICD-10-CM | POA: Diagnosis present

## 2018-05-04 DIAGNOSIS — I639 Cerebral infarction, unspecified: Secondary | ICD-10-CM | POA: Diagnosis not present

## 2018-05-04 DIAGNOSIS — I482 Chronic atrial fibrillation, unspecified: Secondary | ICD-10-CM | POA: Diagnosis present

## 2018-05-04 DIAGNOSIS — I69392 Facial weakness following cerebral infarction: Secondary | ICD-10-CM | POA: Diagnosis not present

## 2018-05-04 DIAGNOSIS — I129 Hypertensive chronic kidney disease with stage 1 through stage 4 chronic kidney disease, or unspecified chronic kidney disease: Secondary | ICD-10-CM | POA: Diagnosis present

## 2018-05-04 DIAGNOSIS — Z8249 Family history of ischemic heart disease and other diseases of the circulatory system: Secondary | ICD-10-CM | POA: Diagnosis not present

## 2018-05-04 DIAGNOSIS — R4182 Altered mental status, unspecified: Secondary | ICD-10-CM | POA: Diagnosis present

## 2018-05-04 DIAGNOSIS — I10 Essential (primary) hypertension: Secondary | ICD-10-CM | POA: Diagnosis not present

## 2018-05-04 DIAGNOSIS — G8191 Hemiplegia, unspecified affecting right dominant side: Secondary | ICD-10-CM | POA: Diagnosis present

## 2018-05-04 DIAGNOSIS — Z87442 Personal history of urinary calculi: Secondary | ICD-10-CM | POA: Diagnosis not present

## 2018-05-04 DIAGNOSIS — Z79899 Other long term (current) drug therapy: Secondary | ICD-10-CM | POA: Diagnosis not present

## 2018-05-04 DIAGNOSIS — R001 Bradycardia, unspecified: Secondary | ICD-10-CM | POA: Diagnosis not present

## 2018-05-04 DIAGNOSIS — N183 Chronic kidney disease, stage 3 (moderate): Secondary | ICD-10-CM | POA: Diagnosis present

## 2018-05-04 DIAGNOSIS — R55 Syncope and collapse: Secondary | ICD-10-CM | POA: Diagnosis present

## 2018-05-04 DIAGNOSIS — Z7901 Long term (current) use of anticoagulants: Secondary | ICD-10-CM | POA: Diagnosis not present

## 2018-05-04 LAB — COMPREHENSIVE METABOLIC PANEL
ALT: 15 U/L (ref 0–44)
AST: 17 U/L (ref 15–41)
Albumin: 3.2 g/dL — ABNORMAL LOW (ref 3.5–5.0)
Alkaline Phosphatase: 35 U/L — ABNORMAL LOW (ref 38–126)
Anion gap: 6 (ref 5–15)
BUN: 16 mg/dL (ref 8–23)
CO2: 21 mmol/L — ABNORMAL LOW (ref 22–32)
Calcium: 9 mg/dL (ref 8.9–10.3)
Chloride: 114 mmol/L — ABNORMAL HIGH (ref 98–111)
Creatinine, Ser: 1.01 mg/dL (ref 0.61–1.24)
GFR calc non Af Amer: >60 mL/min (ref >60)
Glucose, Bld: 103 mg/dL — ABNORMAL HIGH (ref 70–99)
Potassium: 4 mmol/L (ref 3.5–5.1)
Sodium: 141 mmol/L (ref 135–145)
Total Bilirubin: 1.5 mg/dL — ABNORMAL HIGH (ref 0.3–1.2)
Total Protein: 5.6 g/dL — ABNORMAL LOW (ref 6.5–8.1)

## 2018-05-04 LAB — LIPID PANEL
Cholesterol: 145 mg/dL (ref 0–200)
HDL: 37 mg/dL — AB (ref >40)
LDL Cholesterol: 98 mg/dL (ref 0–99)
Total CHOL/HDL Ratio: 3.9 RATIO
Triglycerides: 52 mg/dL (ref <150)
VLDL: 10 mg/dL (ref 0–40)

## 2018-05-04 LAB — PROTIME-INR
INR: 1.9 — AB (ref 0.8–1.2)
Prothrombin Time: 21.8 seconds — ABNORMAL HIGH (ref 11.4–15.2)

## 2018-05-04 LAB — ECHOCARDIOGRAM COMPLETE
Height: 68 in
Weight: 3015.89 oz

## 2018-05-04 MED ORDER — ATORVASTATIN CALCIUM 20 MG PO TABS
20.0000 mg | ORAL_TABLET | Freq: Every day | ORAL | Status: DC
  Administered 2018-05-04 – 2018-05-05 (×2): 20 mg via ORAL
  Filled 2018-05-04 (×2): qty 1

## 2018-05-04 MED ORDER — WARFARIN SODIUM 5 MG PO TABS
10.0000 mg | ORAL_TABLET | Freq: Once | ORAL | Status: AC
  Administered 2018-05-04: 10 mg via ORAL
  Filled 2018-05-04: qty 2

## 2018-05-04 MED ORDER — WARFARIN SODIUM 7.5 MG PO TABS: 7.5000 mg | ORAL_TABLET | Freq: Once | ORAL | Status: DC

## 2018-05-04 NOTE — Evaluation (Signed)
Occupational Therapy Evaluation Patient Details Name: Ross Lewis MRN: [id number] DOB: 1940-07-27 Today's Date: 05/04/2018    History of Present Illness Ross Lewis is a 78 y.o. male with chronic atrial fibrillation anticoagulated with warfarin presented to the emergency department by EMS as a code stroke.  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  The patient was found sitting in a chair after her son had left him for a few minutes and returned to find him shaking of both feet and right upper extremity and unresponsive with his tongue sticking out of the right side of the mouth.  The patient was grunting excessively.Marland Kitchen  He also had some right-sided weakness that resolved.  He was having symptoms of expressive aphasia.  He was able to nod yes or no to commands.  His neurological deficits resolved by the time he arrived.  The patient has no recall of his ambulance ride to the hospital.  Reportedly the patient has had multiple TIAs in the past in addition to a prior CVA.  The patient does not have a history of prior seizures.   Clinical Impression   Pt received supine in bed, agreeable to OT evaluation. Pt performing ADLs at baseline independence this am. Supervision for mobility tasks and managing IV pole. No further OT services required at this time.      Follow Up Recommendations  No OT follow up    Equipment Recommendations  None recommended by OT       Precautions / Restrictions Precautions Precautions: None Restrictions Weight Bearing Restrictions: No      Mobility Bed Mobility Overal bed mobility: Modified Independent                Transfers Overall transfer level: Modified independent Equipment used: None                      ADL either performed or assessed with clinical judgement   ADL Overall ADL's : Modified independent;At baseline                                              Vision Baseline Vision/History: Wears glasses Wears Glasses: At all times Patient Visual Report: No change from baseline Vision Assessment?: No apparent visual deficits            Pertinent Vitals/Pain Pain Assessment: No/denies pain     Hand Dominance Right   Extremity/Trunk Assessment Upper Extremity Assessment Upper Extremity Assessment: Overall WFL for tasks assessed   Lower Extremity Assessment Lower Extremity Assessment: Defer to PT evaluation   Cervical / Trunk Assessment Cervical / Trunk Assessment: Normal   Communication Communication Communication: No difficulties   Cognition Arousal/Alertness: Awake/alert Behavior During Therapy: WFL for tasks assessed/performed Overall Cognitive Status: Within Functional Limits for tasks assessed                                                Home Living Family/patient expects to be discharged to:: Private residence Living Arrangements: Spouse/significant other Available Help at Discharge: Family;Available 24 hours/day Type of Home: House Home Access: Stairs to enter [employer] of Steps: 6 Entrance  Stairs-Rails: Right;Left;Can reach both Home Layout: One level     Bathroom Shower/Tub: Chief Strategy Officer: Standard     Home Equipment: None          Prior Functioning/Environment Level of Independence: Independent                 OT Problem List: Decreased activity tolerance       AM-PAC OT "6 Clicks" Daily Activity     Outcome Measure Help from another person eating meals?: None Help from another person taking care of personal grooming?: None Help from another person toileting, which includes using toliet, bedpan, or urinal?: None Help from another person bathing (including washing, rinsing, drying)?: None Help from another person to put on and taking off regular upper body clothing?: None Help from another person  to put on and taking off regular lower body clothing?: None 6 Click Score: 24   End of Session    Activity Tolerance: Patient tolerated treatment well Patient left: in bed;with call bell/phone within reach;with family/visitor present  OT Visit Diagnosis: Muscle weakness (generalized) (M62.81)                Time: 7867-5449 OT Time Calculation (min): 13 min Charges:  OT General Charges $OT Visit: 1 Visit OT Evaluation $OT Eval Low Complexity: 1 Low  Ezra Sites, OTR/L  [phone] 05/04/2018, 9:05 AM

## 2018-05-04 NOTE — Progress Notes (Signed)
SLP Cancellation Note  Patient Details Name: SHAFFER CARRY MRN: [id number] DOB: 09-15-1940   Cancelled treatment:       Reason Eval/Treat Not Completed: SLP screened, no needs identified, will sign off; SLP screened Pt in room. Pt denies any changes in swallowing, speech, language, or cognition. MRI negative for acute changes. SLE will be deferred at this time. Reconsult if indicated. SLP will sign off.   Thank you,  Havery Moros, CCC-SLP [phone]    Lamond Glantz 05/04/2018, 1:15 PM

## 2018-05-04 NOTE — Progress Notes (Signed)
EEG Completed; Results Pending  

## 2018-05-04 NOTE — Plan of Care (Signed)
  Problem: Education: Goal: Knowledge of General Education information will improve Description Including pain rating scale, medication(s)/side effects and non-pharmacologic comfort measures Outcome: Progressing   Problem: Health Behavior/Discharge Planning: Goal: Ability to manage health-related needs will improve Outcome: Progressing   Problem: Activity: Goal: Risk for activity intolerance will decrease Outcome: Progressing   Problem: Nutrition: Goal: Adequate nutrition will be maintained Outcome: Progressing   Problem: Coping: Goal: Level of anxiety will decrease Outcome: Progressing   Problem: Elimination: Goal: Will not experience complications related to bowel motility Outcome: Progressing Goal: Will not experience complications related to urinary retention Outcome: Progressing   Problem: Pain Managment: Goal: General experience of comfort will improve Outcome: Progressing   Problem: Education: Goal: Knowledge of disease or condition will improve Outcome: Progressing Goal: Knowledge of patient specific risk factors addressed and post discharge goals established will improve Outcome: Progressing

## 2018-05-04 NOTE — Progress Notes (Signed)
*  PRELIMINARY RESULTS* Echocardiogram 2D Echocardiogram has been performed.  Ross Lewis 05/04/2018, 11:12 AM

## 2018-05-04 NOTE — Procedures (Signed)
  [facility] Ross Repass A. Gerilyn Pilgrim, MD     [email or web address]           HISTORY: The patient is 78 year old male who presents with recurrent episodes of syncope and loss of consciousness.  The most recent event with associated with shaking of the extremities suggestive of seizures.  MEDICATIONS: No current facility-administered medications for this encounter.  No current outpatient medications on file.  Facility-Administered Medications Ordered in Other Encounters:  .   stroke: mapping our early stages of recovery book, , Does not apply, Once, Johnson, Clanford L, MD .  0.9 %  sodium chloride infusion, , Intravenous, Continuous, Johnson, Clanford L, MD .  acetaminophen (TYLENOL) tablet 650 mg, 650 mg, Oral, Q4H PRN **OR** acetaminophen (TYLENOL) solution 650 mg, 650 mg, Per Tube, Q4H PRN **OR** acetaminophen (TYLENOL) suppository 650 mg, 650 mg, Rectal, Q4H PRN, Johnson, Clanford L, MD .  atorvastatin (LIPITOR) tablet 20 mg, 20 mg, Oral, q1800, Johnson, Clanford L, MD, 20 mg at 05/04/18 1702 .  diltiazem (CARDIZEM CD) 24 hr capsule 120 mg, 120 mg, Oral, Daily, Johnson, Clanford L, MD, 120 mg at 05/04/18 0933 .  senna-docusate (Senokot-S) tablet 1 tablet, 1 tablet, Oral, QHS PRN, Johnson, Clanford L, MD .  tamsulosin (FLOMAX) capsule 0.4 mg, 0.4 mg, Oral, Daily, Johnson, Clanford L, MD, 0.4 mg at 05/04/18 0933 .  Warfarin - Pharmacist Dosing Inpatient, , Does not apply, q1800, Johnson, Clanford L, MD     ANALYSIS: A 16 channel recording using standard 10 20 measurements is conducted for 23 minutes.  There is a well-formed posterior dominant rhythm of 8.5 Hz which attenuates with eye opening.  There is beta activity observed in the frontal areas.  Awake and drowsy activities are observed followed by spindles and K complexes indicative of stage II non-REM sleep.  There is a single episode that is brief of rhythmic frontal delta activity.  Photic stimulation is carried out without  abnormal changes in the background activity.  There is no focal or lateral slowing observed.  There is no epileptiform activity is observed.   IMPRESSION: 1.  This is a normal recording of the awake and sleep states.      Kassim Guertin A. [name], M.D.  Diplomate, Biomedical engineer of Psychiatry and Neurology ( Neurology).

## 2018-05-04 NOTE — Progress Notes (Addendum)
ANTICOAGULATION CONSULT NOTE - follow up Consult  Pharmacy Consult for Coumadin Indication: atrial fibrillation  No Known Allergies  Patient Measurements: Height: 5\' 8"  (172.7 cm) Weight: 188 lb 7.9 oz (85.5 kg) IBW/kg (Calculated) : 68.4  Vital Signs: Temp: 98 F (36.7 C) (03/05 0500) Temp Source: Oral (03/05 0500) BP: 108/66 (03/05 0500) Pulse Rate: 95 (03/05 0500)  Labs: Recent Labs    05/03/18 1214 05/04/18 0438  HGB 15.1  --   HCT 47.9  --   PLT 167  --   APTT 32  --   LABPROT 20.1* 21.8*  INR 1.7* 1.9*  CREATININE 1.31* 1.01    Estimated Creatinine Clearance: 63.1 mL/min (by C-G formula based on SCr of 1.01 mg/dL).   Medical History: Past Medical History:  Diagnosis Date  . Atrial fibrillation ([name])   . Chronic back pain   . CKD (chronic kidney disease), stage II   . Hypertension   . Nephrolithiasis   . Stroke [facility])    right cerebellar infarct  . TIA (transient ischemic attack)     Medications:  See med rec  Assessment: 78 yo male on chronic coumadin for afib. He is followed in anticoag clinic. INR trending upward but subtherapeutic. Dose ordered 3/4 was not given.  Current home dose is 5mg  daily except 7.5mg  on Friday  Goal of Therapy:  INR 2.5-3.5 per anticoag clinic Monitor platelets by anticoagulation protocol: Yes   Plan:  Coumadin 10 mg po x 1 today PT-INR daily Monitor for s/s of bleeding  Elder Cyphers, BS Loura Back, BCPS Clinical Pharmacist Pager [phone] 05/04/2018,8:26 AM

## 2018-05-04 NOTE — Evaluation (Signed)
Physical Therapy Evaluation Patient Details Name: Ross Lewis MRN: [id number] DOB: [birth year] Today's Date: 05/04/2018   History of Present Illness  Ross Lewis is a 78 y.o. male with chronic atrial fibrillation anticoagulated with warfarin presented to the emergency department by EMS as a code stroke.  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  The patient was found sitting in a chair after her son had left him for a few minutes and returned to find him shaking of both feet and right upper extremity and unresponsive with his tongue sticking out of the right side of the mouth.  The patient was grunting excessively.Marland Kitchen  He also had some right-sided weakness that resolved.  He was having symptoms of expressive aphasia.  He was able to nod yes or no to commands.  His neurological deficits resolved by the time he arrived.  The patient has no recall of his ambulance ride to the hospital.  Reportedly the patient has had multiple TIAs in the past in addition to a prior CVA.  The patient does not have a history of prior seizures.    Clinical Impression  Patient functioning at baseline for functional mobility and gait.  Plan:  Patient discharged from physical therapy to care of nursing for ambulation daily as tolerated for length of stay.     Follow Up Recommendations No PT follow up    Equipment Recommendations  None recommended by PT    Recommendations for Other Services       Precautions / Restrictions Precautions Precautions: None Restrictions Weight Bearing Restrictions: No      Mobility  Bed Mobility Overal bed mobility: Modified Independent                Transfers Overall transfer level: Modified independent Equipment used: None                Ambulation/Gait Ambulation/Gait assistance: Modified independent (Device/Increase time) Gait Distance (Feet): 120 Feet Assistive  device: None Gait Pattern/deviations: WFL(Within Functional Limits) Gait velocity: slightly decreased   General Gait Details: demonstrates good return for ambulation on level, inclined and declined surfaces without loss of balance  Stairs Stairs: Yes Stairs assistance: Modified independent (Device/Increase time) Stair Management: One rail Right;One rail Left;Alternating pattern Number of Stairs: 4 General stair comments: demonstrates good return for going up/down stairs using 1 siderail without loss of balance  Wheelchair Mobility    Modified Rankin (Stroke Patients Only)       Balance Overall balance assessment: No apparent balance deficits (not formally assessed)                                           Pertinent Vitals/Pain Pain Assessment: No/denies pain    Home Living Family/patient expects to be discharged to:: Private residence Living Arrangements: Spouse/significant other Available Help at Discharge: Family;Available 24 hours/day Type of Home: House Home Access: Stairs to enter Entrance Stairs-Rails: Right;Left;Can reach both Entrance Stairs-Number of Steps: 6 Home Layout: One level Home Equipment: Walker - 2 wheels      Prior Function Level of Independence: Independent         Comments: communit ambulator, drives, works     Higher education careers adviser Dominance   Dominant Hand: Right    Extremity/Trunk Assessment   Upper Extremity Assessment Upper Extremity Assessment:  Defer to OT evaluation    Lower Extremity Assessment Lower Extremity Assessment: Overall WFL for tasks assessed    Cervical / Trunk Assessment Cervical / Trunk Assessment: Normal  Communication   Communication: No difficulties  Cognition Arousal/Alertness: Awake/alert Behavior During Therapy: WFL for tasks assessed/performed Overall Cognitive Status: Within Functional Limits for tasks assessed                                        General Comments       Exercises     Assessment/Plan    PT Assessment Patent does not need any further PT services  PT Problem List         PT Treatment Interventions      PT Goals (Current goals can be found in the Care Plan section)  Acute Rehab PT Goals Patient Stated Goal: return home PT Goal Formulation: With patient/family Time For Goal Achievement: 05/04/18 Potential to Achieve Goals: Good    Frequency     Barriers to discharge        Co-evaluation               AM-PAC PT "6 Clicks" Mobility  Outcome Measure Help needed turning from your back to your side while in a flat bed without using bedrails?: None Help needed moving from lying on your back to sitting on the side of a flat bed without using bedrails?: None Help needed moving to and from a bed to a chair (including a wheelchair)?: None Help needed standing up from a chair using your arms (e.g., wheelchair or bedside chair)?: None Help needed to walk in hospital room?: None Help needed climbing 3-5 steps with a railing? : None 6 Click Score: 24    End of Session   Activity Tolerance: Patient tolerated treatment well Patient left: in bed;with call bell/phone within reach;with family/visitor present(seated at bedside) Nurse Communication: Mobility status PT Visit Diagnosis: Unsteadiness on feet (R26.81);Other abnormalities of gait and mobility (R26.89);Muscle weakness (generalized) (M62.81)    Time: 8315-1761 PT Time Calculation (min) (ACUTE ONLY): 19 min   Charges:   PT Evaluation $PT Eval Low Complexity: 1 Low PT Treatments $Gait Training: 8-22 mins        12:12 PM, 05/04/18 Ocie Bob, MPT Physical Therapist with [facility] [phone] office [phone] mobile phone

## 2018-05-04 NOTE — Plan of Care (Signed)
  Problem: Education: Goal: Knowledge of General Education information will improve Description Including pain rating scale, medication(s)/side effects and non-pharmacologic comfort measures Outcome: Progressing   Problem: Health Behavior/Discharge Planning: Goal: Ability to manage health-related needs will improve Outcome: Progressing   Problem: Activity: Goal: Risk for activity intolerance will decrease Outcome: Progressing   Problem: Nutrition: Goal: Adequate nutrition will be maintained Outcome: Progressing   Problem: Coping: Goal: Level of anxiety will decrease Outcome: Progressing   Problem: Elimination: Goal: Will not experience complications related to bowel motility Outcome: Progressing Goal: Will not experience complications related to urinary retention Outcome: Progressing   Problem: Pain Managment: Goal: General experience of comfort will improve Outcome: Progressing   Problem: Education: Goal: Knowledge of disease or condition will improve 05/04/2018 1740 by Darreld Mclean, RN Outcome: Progressing 05/04/2018 1122 by Darreld Mclean, RN Outcome: Progressing 05/04/2018 1115 by Darreld Mclean, RN Outcome: Progressing Goal: Knowledge of patient specific risk factors addressed and post discharge goals established will improve 05/04/2018 1740 by Darreld Mclean, RN Outcome: Progressing 05/04/2018 1122 by Darreld Mclean, RN Outcome: Progressing 05/04/2018 1115 by Darreld Mclean, RN Outcome: Progressing   Problem: Education: Goal: Knowledge of secondary prevention will improve Outcome: Progressing

## 2018-05-04 NOTE — Care Management Obs Status (Signed)
MEDICARE OBSERVATION STATUS NOTIFICATION   Patient Details  Name: Ross Lewis MRN: [id number] Date of Birth: Jun 27, 1940   Medicare Observation Status Notification Given:  Yes    Malcolm Metro, RN 05/04/2018, 1:07 PM

## 2018-05-04 NOTE — Progress Notes (Signed)
PROGRESS NOTE    Ross Lewis  ZOX:[id number]  DOB: 1940/07/27  DOA: 05/03/2018 PCP: [name], MD   Brief Admission Hx: 78 y.o. male with chronic atrial fibrillation anticoagulated with warfarin presented to the emergency department by EMS as a code stroke.  He appears to have had a recurrent unprovoked syncopal episode.   MDM/Assessment & Plan:   1. Recurrent syncopal episode - MRI brain does not show acute ischemic insult which is reassuring.  However, patient's symptoms are concerning for syncope versus seizure.  EEG pending.  Pt also noted to have bradycardia overnight with HR or 34.  I am discontinuing toprol.  Continue diltiazem. Follow telemetry.  2D Echocardiogram pending.  Check TSH.   2. Bradycardia - iatrogenic - will discontinue toprol XL, continue diltiazem and follow rate, may need to reduce diltiazem dose.   3. Chronic atrial fibrillation - he has been maintained on warfarin for anticoagulation and diltiazem and toprol xl for rate control.  As noted above, will discontinue toprol xl due to severe bradycardia.  4. Essential hypertension - resumed home diltiazem, monitor blood pressure.  Would avoid further AV nodal blocking agents due to severe bradycardia.  5. Possible epileptic seizure - EEG pending.  Neurology consult pending.   6. CKD stage 3 - creatinine improving with gentle hydration.    DVT prophylaxis: warfarin  Code Status: Full  Family Communication: wife and son bedside  Disposition Plan: inpatient for further workup and subspecialty consultation   Consultants:  neurology  Procedures:    Antimicrobials:     Subjective: Pt says that he had no further events overnight of synope, confusion or black outs.   Objective: Vitals:   05/04/18 0301 05/04/18 0500 05/04/18 0800 05/04/18 1200  BP: 120/63 108/66 114/63 136/77  Pulse: (!) 34 95  66  Resp: Temp: 98.4 F (36.9 C) 98 F (36.7 C) 98.4 F (36.9 C) 98.1 F (36.7 C)    TempSrc: Oral Oral Oral Oral  SpO2: 98% 97% 98% 98%  Weight:      Height:        Intake/Output Summary (Last 24 hours) at 05/04/2018 1354 Last data filed at 05/04/2018 0900 Gross per 24 hour  Intake 240 ml  Output 150 ml  Net 90 ml   Filed Weights   05/03/18 1211 05/03/18 1900  Weight: 99.8 kg 85.5 kg     REVIEW OF SYSTEMS  As per history otherwise all reviewed and reported negative  Exam:  General exam: awake, alert, NAD, cooperative, very intelligent with excellent recall.  Respiratory system: Clear. No increased work of breathing. Cardiovascular system: S1 & S2 heard. No JVD, murmurs, gallops, clicks or pedal edema. Gastrointestinal system: Abdomen is nondistended, soft and nontender. Normal bowel sounds heard. Central nervous system: Alert and oriented. No focal neurological deficits. Extremities: no CCE.  Data Reviewed: Basic Metabolic Panel: Recent Labs  Lab 05/03/18 1214 05/04/18 0438  NA 141 141  K 4.3 4.0  CL 111 114*  CO2 22 21*  GLUCOSE 126* 103*  BUN 17 16  CREATININE 1.31* 1.01  CALCIUM 9.4 9.0   Liver Function Tests: Recent Labs  Lab 05/03/18 1214 05/04/18 0438  AST 20 17  ALT 17 15  ALKPHOS 42 35*  BILITOT 1.5* 1.5*  PROT 6.6 5.6*  ALBUMIN 3.6 3.2*   No results for input(s): LIPASE, AMYLASE in the last 168 hours. No results for input(s): AMMONIA in the last 168 hours. CBC: Recent Labs  Lab 05/03/18 1214  WBC 7.6  NEUTROABS 4.0  HGB 15.1  HCT 47.9  MCV 99.8  PLT 167   Cardiac Enzymes: No results for input(s): CKTOTAL, CKMB, CKMBINDEX, TROPONINI in the last 168 hours. CBG (last 3)  Recent Labs    05/03/18 1208  GLUCAP 122*   No results found for this or any previous visit (from the past 240 hour(s)).   Studies: Ct Angio Head W Or Wo Contrast  Result Date: 05/03/2018 CLINICAL DATA:  Code stroke.  Aphasia and diaphoresis EXAM: CT ANGIOGRAPHY HEAD AND NECK TECHNIQUE: Multidetector CT imaging of the head and neck was performed  using the standard protocol during bolus administration of intravenous contrast. Multiplanar CT image reconstructions and MIPs were obtained to evaluate the vascular anatomy. Carotid stenosis measurements (when applicable) are obtained utilizing NASCET criteria, using the distal internal carotid diameter as the denominator. CONTRAST:  OMNIPAQUE IOHEXOL 350 MG/ML SOLN COMPARISON:  CT head 05/23/2018 FINDINGS: CTA NECK FINDINGS Aortic arch: Mild atherosclerotic disease aortic arch. Proximal great vessels widely patent. Right carotid system: Mild atherosclerotic disease right carotid bifurcation without significant stenosis. Left carotid system: Atherosclerotic disease left carotid bifurcation. Approximately 25% diameter stenosis proximal left internal carotid artery due to calcific disease. Vertebral arteries: Both vertebral arteries patent to the basilar. Mild atherosclerotic disease at the origin of the right vertebral artery with mild stenosis. Skeleton: No acute skeletal abnormality. Mild degenerative changes in the cervical spine. Other neck: Negative Upper chest: Lung apices clear bilaterally. Review of the MIP images confirms the above findings CTA HEAD FINDINGS Anterior circulation: Mild atherosclerotic disease in the cavernous carotid bilaterally without stenosis. Anterior and middle cerebral arteries patent without large vessel occlusion. Mild stenosis left MCA bifurcation. Posterior circulation: Both vertebral arteries patent to the basilar. Basilar widely patent. PICA, superior cerebellar, and posterior cerebral arteries patent bilaterally. Moderate stenosis right P1 and P2 segments. Left posterior cerebral artery patent without stenosis. Venous sinuses: Patent Anatomic variants: None Delayed phase: No enhancing lesions postcontrast infusion. Review of the MIP images confirms the above findings IMPRESSION: 1. Negative for emergent large vessel occlusion. 2. Approximately 25% diameter stenosis  proximal left internal carotid artery. Right carotid artery widely patent. Mild stenosis origin of right vertebral artery 3. Moderate stenosis right P1 segment and P2 segments. Mild stenosis left MCA bifurcation. 4. These results were called by telephone at the time of interpretation on 05/03/2018 at 12:50 pm to Dr. Benjiman Core , who verbally acknowledged these results. Electronically Signed   By: Marlan Palau M.D.   On: 05/03/2018 12:51   Ct Angio Neck W And/or Wo Contrast  Result Date: 05/03/2018 CLINICAL DATA:  Code stroke.  Aphasia and diaphoresis EXAM: CT ANGIOGRAPHY HEAD AND NECK TECHNIQUE: Multidetector CT imaging of the head and neck was performed using the standard protocol during bolus administration of intravenous contrast. Multiplanar CT image reconstructions and MIPs were obtained to evaluate the vascular anatomy. Carotid stenosis measurements (when applicable) are obtained utilizing NASCET criteria, using the distal internal carotid diameter as the denominator. CONTRAST:  OMNIPAQUE IOHEXOL 350 MG/ML SOLN COMPARISON:  CT head 05/23/2018 FINDINGS: CTA NECK FINDINGS Aortic arch: Mild atherosclerotic disease aortic arch. Proximal great vessels widely patent. Right carotid system: Mild atherosclerotic disease right carotid bifurcation without significant stenosis. Left carotid system: Atherosclerotic disease left carotid bifurcation. Approximately 25% diameter stenosis proximal left internal carotid artery due to calcific disease. Vertebral arteries: Both vertebral arteries patent to the basilar. Mild atherosclerotic disease at the origin of the right  vertebral artery with mild stenosis. Skeleton: No acute skeletal abnormality. Mild degenerative changes in the cervical spine. Other neck: Negative Upper chest: Lung apices clear bilaterally. Review of the MIP images confirms the above findings CTA HEAD FINDINGS Anterior circulation: Mild atherosclerotic disease in the cavernous carotid  bilaterally without stenosis. Anterior and middle cerebral arteries patent without large vessel occlusion. Mild stenosis left MCA bifurcation. Posterior circulation: Both vertebral arteries patent to the basilar. Basilar widely patent. PICA, superior cerebellar, and posterior cerebral arteries patent bilaterally. Moderate stenosis right P1 and P2 segments. Left posterior cerebral artery patent without stenosis. Venous sinuses: Patent Anatomic variants: None Delayed phase: No enhancing lesions postcontrast infusion. Review of the MIP images confirms the above findings IMPRESSION: 1. Negative for emergent large vessel occlusion. 2. Approximately 25% diameter stenosis proximal left internal carotid artery. Right carotid artery widely patent. Mild stenosis origin of right vertebral artery 3. Moderate stenosis right P1 segment and P2 segments. Mild stenosis left MCA bifurcation. 4. These results were called by telephone at the time of interpretation on 05/03/2018 at 12:50 pm to Dr. Benjiman Core , who verbally acknowledged these results. Electronically Signed   By: Marlan Palau M.D.   On: 05/03/2018 12:51   Mr Brain Wo Contrast  Result Date: 05/03/2018 CLINICAL DATA:  Unresponsiveness. Aphasia and right-sided weakness. Prior stroke. EXAM: MRI HEAD WITHOUT CONTRAST TECHNIQUE: Multiplanar, multiecho pulse sequences of the brain and surrounding structures were obtained without intravenous contrast. COMPARISON:  Head CT and CTA 05/03/2018 and MRI 05/02/2012 FINDINGS: Brain: There is no evidence of acute infarct, intracranial hemorrhage, mass, midline shift, or extra-axial fluid collection. The ventricles and sulci are within normal limits for age. A small chronic infarct is again noted in the right cerebellum. There is minimal gliosis at the site of the acute left frontoparietal infarct on the prior MRI. No significant chronic white matter disease is seen elsewhere for age. Vascular: Major intracranial vascular flow  voids are preserved. Skull and upper cervical spine: No suspicious marrow lesion. Sinuses/Orbits: Unremarkable orbits. Extensive left maxillary sinus mucosal thickening. Clear mastoid air cells. Other: None. IMPRESSION: 1. No acute intracranial abnormality. 2. Small chronic right cerebellar infarct. Electronically Signed   By: Sebastian Ache M.D.   On: 05/03/2018 15:56   US Carotid Bilateral (at [facility] And [facility] Only)  Result Date: 05/03/2018 CLINICAL DATA:  Aphasia, mental status change and carotid atherosclerosis by CTA. EXAM: BILATERAL CAROTID DUPLEX ULTRASOUND TECHNIQUE: Wallace Cullens scale imaging, color Doppler and duplex ultrasound were performed of bilateral carotid and vertebral arteries in the neck. COMPARISON:  CTA of the neck earlier today. FINDINGS: Criteria: Quantification of carotid stenosis is based on velocity parameters that correlate the residual internal carotid diameter with NASCET-based stenosis levels, using the diameter of the distal internal carotid lumen as the denominator for stenosis measurement. The following velocity measurements were obtained: RIGHT ICA:  52/14 cm/sec CCA:  55/10 cm/sec SYSTOLIC ICA/CCA RATIO:  0.9 ECA:  73 cm/sec LEFT ICA:  54/20 cm/sec CCA:  65/14 cm/sec SYSTOLIC ICA/CCA RATIO:  0.8 ECA:  60 cm/sec RIGHT CAROTID ARTERY: Mild amount of partially calcified plaque is present at the level of the carotid bulb and at the right ICA origin. Estimated right ICA stenosis is less than 50%. RIGHT VERTEBRAL ARTERY: Antegrade flow with normal waveform and velocity. LEFT CAROTID ARTERY: There is a mild amount of predominately calcified plaque at the level of the left carotid bulb and proximal left ICA. Estimated left ICA stenosis is less than 50%. LEFT VERTEBRAL ARTERY:  Antegrade flow with normal waveform and velocity. IMPRESSION: Mild amount of plaque at the level of both carotid bulbs and proximal internal carotid arteries. Estimated bilateral ICA stenoses are less than 50%. Electronically  Signed   By: Irish LackGlenn  Yamagata M.D.   On: 05/03/2018 15:22   Mr Maxine GlennMra Head/brain RUWo Cm  Result Date: 05/03/2018 CLINICAL DATA:  Unresponsiveness. Aphasia and right-sided weakness. Prior stroke. EXAM: MRA HEAD WITHOUT CONTRAST TECHNIQUE: Angiographic images of the Circle of Willis were obtained using MRA technique without intravenous contrast. COMPARISON:  Head and neck CTA 05/03/2018 FINDINGS: The visualized distal vertebral arteries are widely patent to the basilar. Patent PICA and SCA origins are seen bilaterally. The basilar artery is widely patent. The PCAs are patent with moderate to severe proximal and distal P1 stenoses again seen on the right. The internal carotid arteries are widely patent from skull base to carotid termini. ACAs and MCAs are patent without evidence of proximal branch occlusion or significant A1 or M1 stenosis. Intermittent artifact is noted through the MCA branch vessels with M2 origin stenosis better evaluated on earlier CTA. No aneurysm is identified. IMPRESSION: 1. Patent anterior and posterior intracranial circulation without major branch occlusion. 2. Moderate to severe proximal right PCA stenoses. Electronically Signed   By: Sebastian AcheAllen  Grady M.D.   On: 05/03/2018 16:06   Ct Head Code Stroke Wo Contrast  Result Date: 05/03/2018 CLINICAL DATA:  Code stroke. 78 year old male with aphasia and diaphoresis last known well 30 minutes ago. EXAM: CT HEAD WITHOUT CONTRAST TECHNIQUE: Contiguous axial images were obtained from the base of the skull through the vertex without intravenous contrast. COMPARISON:  Head CT 10/19/2017 and earlier. FINDINGS: Brain: Small chronic infarct in the posterior right cerebellum is stable. Mild for age patchy white matter hypodensity. No midline shift, ventriculomegaly, mass effect, evidence of mass lesion, intracranial hemorrhage or evidence of cortically based acute infarction. Vascular: Calcified atherosclerosis at the skull base. No suspicious intracranial  vascular hyperdensity. Skull: Negative. Sinuses/Orbits: Chronic left maxillary sinus disease. Other Visualized paranasal sinuses and mastoids are stable and well pneumatized. Other: No acute orbit or scalp soft tissue findings. ASPECTS [facility]([place] Stroke Program Early CT Score) - Ganglionic level infarction (caudate, lentiform nuclei, internal capsule, insula, M1-M3 cortex): 7 - Supraganglionic infarction (M4-M6 cortex): 3 Total score (0-10 with 10 being normal): 10 IMPRESSION: 1. No acute cortically based infarct or acute intracranial hemorrhage identified. ASPECTS is 10. 2. Small chronic right cerebellar infarct. 3. Study discussed by telephone with Dr. Benjiman CoreNATHAN PICKERING on 05/03/2018 at 12:27 . Electronically Signed   By: Odessa FlemingH  Hall M.D.   On: 05/03/2018 12:27     Scheduled Meds: .  stroke: mapping our early stages of recovery book   Does not apply Once  . atorvastatin  20 mg Oral q1800  . diltiazem  120 mg Oral Daily  . tamsulosin  0.4 mg Oral Daily  . warfarin  10 mg Oral Once  . Warfarin - Pharmacist Dosing Inpatient   Does not apply q1800   Continuous Infusions: . sodium chloride      Principal Problem:   Acute CVA (cerebrovascular accident) ([name]) Active Problems:   Chronic a-fib   Essential hypertension   Long term current use of anticoagulant therapy   Anticoagulated on Coumadin   Altered mental state   Subtherapeutic international normalized ratio (INR)   Chronic kidney disease (CKD) stage G3a/A1, moderately decreased glomerular filtration rate (GFR) between 45-59 mL/min/1.73 square meter and albuminuria creatinine ratio less than 30 mg/g ([name])  Aphasia   Time spent:   Standley Dakins, MD [facility] 05/04/2018, 1:54 PM    LOS: 0 days  How to contact the [facility] Attending or Consulting provider 7A - 7P or covering provider during after hours 7P -7A, for this patient?  1. Check the care team in [facility] and look for a) attending/consulting [facility] provider listed and b) the [facility] team  listed 2. Log into [email or web address] and use [place] universal password to access. If you do not have the password, please contact the hospital operator. 3. Locate the [facility] provider you are looking for under [facility] and page to a number that you can be directly reached. 4. If you still have difficulty reaching the provider, please page the [employer] (Director on Call) for the Hospitalists listed on amion for assistance.

## 2018-05-04 NOTE — Plan of Care (Signed)
  Problem: Education: Goal: Knowledge of General Education information will improve Description Including pain rating scale, medication(s)/side effects and non-pharmacologic comfort measures Outcome: Progressing   Problem: Health Behavior/Discharge Planning: Goal: Ability to manage health-related needs will improve Outcome: Progressing   Problem: Activity: Goal: Risk for activity intolerance will decrease Outcome: Progressing   Problem: Nutrition: Goal: Adequate nutrition will be maintained Outcome: Progressing   Problem: Coping: Goal: Level of anxiety will decrease Outcome: Progressing   Problem: Elimination: Goal: Will not experience complications related to bowel motility Outcome: Progressing Goal: Will not experience complications related to urinary retention Outcome: Progressing   Problem: Pain Managment: Goal: General experience of comfort will improve Outcome: Progressing   Problem: Education: Goal: Knowledge of disease or condition will improve 05/04/2018 1122 by Darreld Mclean, RN Outcome: Progressing 05/04/2018 1115 by Darreld Mclean, RN Outcome: Progressing Goal: Knowledge of patient specific risk factors addressed and post discharge goals established will improve 05/04/2018 1122 by Darreld Mclean, RN Outcome: Progressing 05/04/2018 1115 by Darreld Mclean, RN Outcome: Progressing   Problem: Education: Goal: Knowledge of secondary prevention will improve Outcome: Progressing

## 2018-05-05 DIAGNOSIS — I482 Chronic atrial fibrillation, unspecified: Secondary | ICD-10-CM

## 2018-05-05 DIAGNOSIS — R55 Syncope and collapse: Principal | ICD-10-CM

## 2018-05-05 DIAGNOSIS — N183 Chronic kidney disease, stage 3 (moderate): Secondary | ICD-10-CM

## 2018-05-05 DIAGNOSIS — I10 Essential (primary) hypertension: Secondary | ICD-10-CM

## 2018-05-05 LAB — GLUCOSE, CAPILLARY: Glucose-Capillary: 102 mg/dL — ABNORMAL HIGH (ref 70–99)

## 2018-05-05 LAB — PROTIME-INR
INR: 2 — ABNORMAL HIGH (ref 0.8–1.2)
Prothrombin Time: 22.2 seconds — ABNORMAL HIGH (ref 11.4–15.2)

## 2018-05-05 MED ORDER — WARFARIN SODIUM 5 MG PO TABS
5.0000 mg | ORAL_TABLET | Freq: Once | ORAL | Status: AC
  Administered 2018-05-05: 5 mg via ORAL
  Filled 2018-05-05: qty 1

## 2018-05-05 NOTE — Progress Notes (Addendum)
ANTICOAGULATION CONSULT NOTE - follow up Consult  Pharmacy Consult for Coumadin Indication: atrial fibrillation  No Known Allergies  Patient Measurements: Height: 5\' 8"  (172.7 cm) Weight: 188 lb 7.9 oz (85.5 kg) IBW/kg (Calculated) : 68.4  Vital Signs: Temp: 97.9 F (36.6 C) (03/06 0800) Temp Source: Oral (03/06 0800) BP: 135/71 (03/06 0800) Pulse Rate: 66 (03/06 0800)  Labs: Recent Labs    05/03/18 1214 05/04/18 0438 05/05/18 0554  HGB 15.1  --   --   HCT 47.9  --   --   PLT 167  --   --   APTT 32  --   --   LABPROT 20.1* 21.8* 22.2*  INR 1.7* 1.9* 2.0*  CREATININE 1.31* 1.01  --     Estimated Creatinine Clearance: 63.1 mL/min (by C-G formula based on SCr of 1.01 mg/dL).   Medical History: Past Medical History:  Diagnosis Date  . Atrial fibrillation ([name])   . Chronic back pain   . CKD (chronic kidney disease), stage II   . Hypertension   . Nephrolithiasis   . Stroke [facility])    right cerebellar infarct  . TIA (transient ischemic attack)     Medications:  See med rec  Assessment: 78 yo male on chronic coumadin for afib. He is followed in anticoag clinic. INRt 2.0. Dose ordered 3/4 was not given.  Current home dose is 5mg  daily except 7.5mg  on Friday  Goal of Therapy:  INR 2.5-3.5 per anticoag clinic Monitor platelets by anticoagulation protocol: Yes   Plan:  Coumadin 5 mg po x 1 today PT-INR daily Monitor for s/s of bleeding  Judeth Cornfield, PharmD Clinical Pharmacist 05/05/2018 9:08 AM

## 2018-05-05 NOTE — Plan of Care (Signed)
  Problem: Education: Goal: Knowledge of General Education information will improve Description Including pain rating scale, medication(s)/side effects and non-pharmacologic comfort measures Outcome: Progressing   Problem: Health Behavior/Discharge Planning: Goal: Ability to manage health-related needs will improve Outcome: Progressing   Problem: Activity: Goal: Risk for activity intolerance will decrease Outcome: Progressing   Problem: Nutrition: Goal: Adequate nutrition will be maintained Outcome: Progressing   Problem: Coping: Goal: Level of anxiety will decrease Outcome: Progressing   Problem: Elimination: Goal: Will not experience complications related to bowel motility Outcome: Progressing Goal: Will not experience complications related to urinary retention Outcome: Progressing   Problem: Pain Managment: Goal: General experience of comfort will improve Outcome: Progressing   Problem: Education: Goal: Knowledge of disease or condition will improve 05/05/2018 1821 by Darreld Mclean, RN Outcome: Progressing 05/05/2018 1820 by Darreld Mclean, RN Outcome: Progressing 05/05/2018 1051 by Darreld Mclean, RN Outcome: Progressing Goal: Knowledge of patient specific risk factors addressed and post discharge goals established will improve 05/05/2018 1821 by Darreld Mclean, RN Outcome: Progressing 05/05/2018 1820 by Darreld Mclean, RN Outcome: Progressing 05/05/2018 1051 by Darreld Mclean, RN Outcome: Progressing   Problem: Education: Goal: Knowledge of secondary prevention will improve 05/05/2018 1821 by Darreld Mclean, RN Outcome: Progressing 05/05/2018 1051 by Darreld Mclean, RN Outcome: Progressing

## 2018-05-05 NOTE — Progress Notes (Signed)
PROGRESS NOTE    Ross Lewis  DHW:[id number]  DOB: [birth year]  DOA: 05/03/2018 PCP: [name], MD   Brief Admission Hx: 78 y.o. male with chronic atrial fibrillation anticoagulated with warfarin presented to the emergency department by EMS as a code stroke.  He appears to have had a recurrent unprovoked syncopal episode.   MDM/Assessment & Plan:   1. Recurrent syncopal episode - MRI brain does not show acute ischemic insult which is reassuring.  However, patient's symptoms are concerning for syncope versus seizure.  EEG did not show any epileptiform discharges.  2D Echocardiogram unrevealing.  TSH normal.   2. Bradycardia - iatrogenic -metoprolol discontinued, continue diltiazem and follow rate, may need to reduce diltiazem dose.   3. Chronic atrial fibrillation - he has been maintained on warfarin for anticoagulation and diltiazem for rate control.  As noted above, toprol xl discontinued due to severe bradycardia.  4. Essential hypertension - resumed home diltiazem, monitor blood pressure.  Would avoid further AV nodal blocking agents due to severe bradycardia.  5. Possible epileptic seizure - EEG did not show any epileptiform discharges.  Neurology consult pending.   6. CKD stage 3 - creatinine improving with gentle hydration.    DVT prophylaxis: warfarin  Code Status: Full  Family Communication: No family present Disposition Plan: inpatient for further workup and subspecialty consultation   Consultants:  neurology  Procedures: Echo: 1. The left ventricle has normal systolic function, with an ejection fraction of 55-60%. The cavity size was normal. Left ventricular diastolic Doppler parameters are indeterminate in the setting of atrial fibrillation. No evidence of left ventricular  regional wall motion abnormalities.  2. The right ventricle has normal systolic function. The cavity was normal. There is no increase in right ventricular wall thickness. Right ventricular  systolic pressure normal with an estimated pressure of 22.7 mmHg.  3. Left atrial size was mildly dilated.  4. Right atrial size was mildly dilated.  5. The mitral valve is normal in structure. There is mild mitral annular calcification present.  6. The tricuspid valve is normal in structure.  7. The aortic valve is tricuspid.  8. The aortic root is normal in size and structure.  9. No atrial level shunt detected by color flow Doppler.   EEG: This is a normal recording of the awake and sleep states.  Antimicrobials:     Subjective: No dizziness or lightheadedness.  No further syncope.  Objective: Vitals:   05/05/18 0000 05/05/18 0800 05/05/18 1200 05/05/18 1600  BP: 140/70 135/71 (!) 153/89 132/79  Pulse: 64 66 61 63  Resp: 18 18 18 18   Temp: (!) 97.5 F (36.4 C) 97.9 F (36.6 C) 97.9 F (36.6 C) 97.9 F (36.6 C)  TempSrc: Oral Oral Oral Oral  SpO2: 99% 99% 98% 99%  Weight:      Height:        Intake/Output Summary (Last 24 hours) at 05/05/2018 1957 Last data filed at 05/05/2018 1700 Gross per 24 hour  Intake 870.32 ml  Output -  Net 870.32 ml   Filed Weights   05/03/18 1211 05/03/18 1900  Weight: 99.8 kg 85.5 kg     REVIEW OF SYSTEMS  As per history otherwise all reviewed and reported negative  Exam:  General exam: Alert, awake, oriented x 3 Respiratory system: Clear to auscultation. Respiratory effort normal. Cardiovascular system:RRR. No murmurs, rubs, gallops. Gastrointestinal system: Abdomen is nondistended, soft and nontender. No organomegaly or masses felt. Normal bowel sounds heard. Central nervous  system: Alert and oriented. No focal neurological deficits. Extremities: No C/C/E, +pedal pulses Skin: No rashes, lesions or ulcers Psychiatry: Judgement and insight appear normal. Mood & affect appropriate.    Data Reviewed: Basic Metabolic Panel: Recent Labs  Lab 05/03/18 1214 05/04/18 0438  NA 141 141  K 4.3 4.0  CL 111 114*  CO2 22 21*    GLUCOSE 126* 103*  BUN 17 16  CREATININE 1.31* 1.01  CALCIUM 9.4 9.0   Liver Function Tests: Recent Labs  Lab 05/03/18 1214 05/04/18 0438  AST 20 17  ALT 17 15  ALKPHOS 42 35*  BILITOT 1.5* 1.5*  PROT 6.6 5.6*  ALBUMIN 3.6 3.2*   No results for input(s): LIPASE, AMYLASE in the last 168 hours. No results for input(s): AMMONIA in the last 168 hours. CBC: Recent Labs  Lab 05/03/18 1214  WBC 7.6  NEUTROABS 4.0  HGB 15.1  HCT 47.9  MCV 99.8  PLT 167   Cardiac Enzymes: No results for input(s): CKTOTAL, CKMB, CKMBINDEX, TROPONINI in the last 168 hours. CBG (last 3)  Recent Labs    05/03/18 1208 05/04/18 2041  GLUCAP 122* 102*   No results found for this or any previous visit (from the past 240 hour(s)).   Studies: No results found.   Scheduled Meds: .  stroke: mapping our early stages of recovery book   Does not apply Once  . atorvastatin  20 mg Oral q1800  . diltiazem  120 mg Oral Daily  . tamsulosin  0.4 mg Oral Daily  . Warfarin - Pharmacist Dosing Inpatient   Does not apply q1800   Continuous Infusions: . sodium chloride 50 mL/hr at 05/05/18 0400    Principal Problem:   Acute CVA (cerebrovascular accident) [facility]) Active Problems:   Chronic a-fib   Essential hypertension   Long term current use of anticoagulant therapy   Anticoagulated on Coumadin   Altered mental state   Subtherapeutic international normalized ratio (INR)   Chronic kidney disease (CKD) stage G3a/A1, moderately decreased glomerular filtration rate (GFR) between 45-59 mL/min/1.73 square meter and albuminuria creatinine ratio less than 30 mg/g ([name])   Aphasia   Time spent:  Erick Blinks, MD [facility] 05/05/2018, 7:57 PM    LOS: 1 day

## 2018-05-05 NOTE — Plan of Care (Signed)
  Problem: Education: Goal: Knowledge of General Education information will improve Description Including pain rating scale, medication(s)/side effects and non-pharmacologic comfort measures Outcome: Progressing   Problem: Health Behavior/Discharge Planning: Goal: Ability to manage health-related needs will improve Outcome: Progressing   Problem: Activity: Goal: Risk for activity intolerance will decrease Outcome: Progressing   Problem: Nutrition: Goal: Adequate nutrition will be maintained Outcome: Progressing   Problem: Coping: Goal: Level of anxiety will decrease Outcome: Progressing   Problem: Elimination: Goal: Will not experience complications related to bowel motility Outcome: Progressing Goal: Will not experience complications related to urinary retention Outcome: Progressing   Problem: Pain Managment: Goal: General experience of comfort will improve Outcome: Progressing   Problem: Education: Goal: Knowledge of disease or condition will improve Outcome: Progressing Goal: Knowledge of patient specific risk factors addressed and post discharge goals established will improve Outcome: Progressing   Problem: Education: Goal: Knowledge of secondary prevention will improve Outcome: Progressing

## 2018-05-05 NOTE — Plan of Care (Signed)
  Problem: Education: Goal: Knowledge of General Education information will improve Description Including pain rating scale, medication(s)/side effects and non-pharmacologic comfort measures Outcome: Progressing   Problem: Health Behavior/Discharge Planning: Goal: Ability to manage health-related needs will improve Outcome: Progressing   Problem: Activity: Goal: Risk for activity intolerance will decrease Outcome: Progressing   Problem: Nutrition: Goal: Adequate nutrition will be maintained Outcome: Progressing   Problem: Coping: Goal: Level of anxiety will decrease Outcome: Progressing   Problem: Elimination: Goal: Will not experience complications related to bowel motility Outcome: Progressing Goal: Will not experience complications related to urinary retention Outcome: Progressing   Problem: Pain Managment: Goal: General experience of comfort will improve Outcome: Progressing   Problem: Education: Goal: Knowledge of disease or condition will improve 05/05/2018 1820 by Darreld Mclean, RN Outcome: Progressing 05/05/2018 1051 by Darreld Mclean, RN Outcome: Progressing Goal: Knowledge of patient specific risk factors addressed and post discharge goals established will improve 05/05/2018 1820 by Darreld Mclean, RN Outcome: Progressing 05/05/2018 1051 by Darreld Mclean, RN Outcome: Progressing   Problem: Education: Goal: Knowledge of secondary prevention will improve Outcome: Progressing

## 2018-05-06 DIAGNOSIS — R569 Unspecified convulsions: Secondary | ICD-10-CM

## 2018-05-06 DIAGNOSIS — R4701 Aphasia: Secondary | ICD-10-CM

## 2018-05-06 LAB — PROTIME-INR
INR: 1.9 — ABNORMAL HIGH (ref 0.8–1.2)
Prothrombin Time: 21.6 seconds — ABNORMAL HIGH (ref 11.4–15.2)

## 2018-05-06 MED ORDER — WARFARIN SODIUM 7.5 MG PO TABS
7.5000 mg | ORAL_TABLET | Freq: Once | ORAL | Status: AC
  Administered 2018-05-06: 7.5 mg via ORAL
  Filled 2018-05-06: qty 1

## 2018-05-06 NOTE — Progress Notes (Signed)
ANTICOAGULATION CONSULT NOTE - follow up Consult  Pharmacy Consult for Coumadin Indication: atrial fibrillation  No Known Allergies  Patient Measurements: Height: 5\' 8"  (172.7 cm) Weight: 188 lb 7.9 oz (85.5 kg) IBW/kg (Calculated) : 68.4  Vital Signs: Temp: 97.4 F (36.3 C) (03/07 0402) Temp Source: Oral (03/07 0402) BP: 139/69 (03/07 0402) Pulse Rate: 46 (03/07 0402)  Labs: Recent Labs    05/03/18 1214 05/04/18 0438 05/05/18 0554 05/06/18 0607  HGB 15.1  --   --   --   HCT 47.9  --   --   --   PLT 167  --   --   --   APTT 32  --   --   --   LABPROT 20.1* 21.8* 22.2* 21.6*  INR 1.7* 1.9* 2.0* 1.9*  CREATININE 1.31* 1.01  --   --     Estimated Creatinine Clearance: 63.1 mL/min (by C-G formula based on SCr of 1.01 mg/dL).   Medical History: Past Medical History:  Diagnosis Date  . Atrial fibrillation ([name])   . Chronic back pain   . CKD (chronic kidney disease), stage II   . Hypertension   . Nephrolithiasis   . Stroke [facility])    right cerebellar infarct  . TIA (transient ischemic attack)     Medications:  See med rec  Assessment: 78 yo male on chronic coumadin for afib. He is followed in anticoag clinic. INR 1.9. Dose ordered 3/4 was not given.  Current home dose is 5mg  daily except 7.5mg  on Friday  Goal of Therapy:  INR 2.5-3.5 per anticoag clinic Monitor platelets by anticoagulation protocol: Yes   Plan:  Coumadin 7.5 mg po x 1 today PT-INR daily Monitor for s/s of bleeding  Judeth Cornfield, PharmD Clinical Pharmacist 05/06/2018 8:38 AM

## 2018-05-06 NOTE — Discharge Summary (Signed)
Physician Discharge Summary  Ross Lewis NWG:[id number] DOB: [birth year] DOA: 05/03/2018  PCP: [name], MD  Admit date: 05/03/2018 Discharge date: 05/06/2018  Admitted From: Home Disposition: Home  Recommendations for Outpatient Follow-up:  1. Follow up with PCP in 1-2 weeks 2. Please obtain BMP/CBC in one week 3. Patient has been referred to follow-up with neurology 4. No driving until cleared by neurology  Discharge Condition: Stable CODE STATUS: Full code Diet recommendation: Heart healthy  Brief/Interim Summary: 78 year old male with a history of chronic atrial fibrillation on anticoagulation, presented to the emergency room with change in mental status.  There was initial concern that he may have had a stroke.  Imaging of his brain was unrevealing.  He was admitted for further work-up.  Discharge Diagnoses:  Principal Problem:   Seizure ([name]) Active Problems:   Chronic a-fib   Essential hypertension   Long term current use of anticoagulant therapy   Anticoagulated on Coumadin   Altered mental state   Subtherapeutic international normalized ratio (INR)   Chronic kidney disease (CKD) stage G3a/A1, moderately decreased glomerular filtration rate (GFR) between 45-59 mL/min/1.73 square meter and albuminuria creatinine ratio less than 30 mg/g ([name])   Aphasia  1. Seizure.  Patients son describes that patient was unresponsive, slumped over in a chair, both his legs and his right arm was shaking and his eyes were rolled back in his head.  EMS was called and he took approximately 30 minutes before the patient's mental status returned to his usual self.  Afterwards, he described his mind feeling "fuzzy".  His son reports an episode approximately a year ago where he had possibly syncopized.  He did not have any jerking movements or postictal period at that time.  It was unlikely the previous episode was a seizure.  Imaging in the hospital including MRI brain, carotid Dopplers and  echocardiogram were unrevealing.  EEG was also normal.  He has not had any further recurrence of symptoms since in the hospital.  Since this would be considered a first-time seizure and there are no obvious precipitating factors on imaging, we will not start the patient on antiepileptic therapy right now.  He will need continued observation and follow-up with neurology.  He should be considered for repeat EEG in the next few weeks.  He has been told not to drive until cleared by neurology. 2. Chronic atrial fibrillation.  Patient was monitored on telemetry and was having episodes of bradycardia.  Heart rate was occasionally down to the 40s.  His Toprol has been discontinued and he has been continued on diltiazem.  Over the past 24 hours, heart rate has remained in the 50s and occasionally in the 70s.  He is continued on Coumadin for anticoagulation. 3. Hypertension.  Blood pressures currently stable on diltiazem. 4. CKD stage III.  Creatinine has improved with hydration.   Discharge Instructions  Discharge Instructions    Diet - low sodium heart healthy   Complete by:  As directed    Driving Restrictions   Complete by:  As directed    No driving until cleared by neurologist   Increase activity slowly   Complete by:  As directed      Allergies as of 05/06/2018   No Known Allergies     Medication List    STOP taking these medications   metoprolol succinate 25 MG 24 hr tablet Commonly known as:  TOPROL-XL     TAKE these medications   diltiazem 120 MG 24 hr  capsule Commonly known as:  Cartia XT Take 1 capsule (120 mg total) by mouth daily.   tamsulosin 0.4 MG Caps capsule Commonly known as:  Flomax Take 1 capsule (0.4 mg total) by mouth every evening. What changed:  when to take this   warfarin 5 MG tablet Commonly known as:  Jantoven Take as directed. If you are unsure how to take this medication, talk to your nurse or doctor. Original instructions:  TAKE ONE AND ONE-HALF TABLETS  BY MOUTH DAILY OR AS DIRECTED BY COUMADIN CLINIC      Follow-up Information    Ross Beams, MD Follow up.   Specialty:  Neurology Why:  call for appointment in 1-2 weeks Contact information: [address] [place] [address] [phone]          No Known Allergies  Consultations:     Procedures/Studies: Ct Angio Head W Or Wo Contrast  Result Date: 05/03/2018 CLINICAL DATA:  Code stroke.  Aphasia and diaphoresis EXAM: CT ANGIOGRAPHY HEAD AND NECK TECHNIQUE: Multidetector CT imaging of the head and neck was performed using the standard protocol during bolus administration of intravenous contrast. Multiplanar CT image reconstructions and MIPs were obtained to evaluate the vascular anatomy. Carotid stenosis measurements (when applicable) are obtained utilizing NASCET criteria, using the distal internal carotid diameter as the denominator. CONTRAST:  OMNIPAQUE IOHEXOL 350 MG/ML SOLN COMPARISON:  CT head 05/23/2018 FINDINGS: CTA NECK FINDINGS Aortic arch: Mild atherosclerotic disease aortic arch. Proximal great vessels widely patent. Right carotid system: Mild atherosclerotic disease right carotid bifurcation without significant stenosis. Left carotid system: Atherosclerotic disease left carotid bifurcation. Approximately 25% diameter stenosis proximal left internal carotid artery due to calcific disease. Vertebral arteries: Both vertebral arteries patent to the basilar. Mild atherosclerotic disease at the origin of the right vertebral artery with mild stenosis. Skeleton: No acute skeletal abnormality. Mild degenerative changes in the cervical spine. Other neck: Negative Upper chest: Lung apices clear bilaterally. Review of the MIP images confirms the above findings CTA HEAD FINDINGS Anterior circulation: Mild atherosclerotic disease in the cavernous carotid bilaterally without stenosis. Anterior and middle cerebral arteries patent without large vessel occlusion. Mild stenosis  left MCA bifurcation. Posterior circulation: Both vertebral arteries patent to the basilar. Basilar widely patent. PICA, superior cerebellar, and posterior cerebral arteries patent bilaterally. Moderate stenosis right P1 and P2 segments. Left posterior cerebral artery patent without stenosis. Venous sinuses: Patent Anatomic variants: None Delayed phase: No enhancing lesions postcontrast infusion. Review of the MIP images confirms the above findings IMPRESSION: 1. Negative for emergent large vessel occlusion. 2. Approximately 25% diameter stenosis proximal left internal carotid artery. Right carotid artery widely patent. Mild stenosis origin of right vertebral artery 3. Moderate stenosis right P1 segment and P2 segments. Mild stenosis left MCA bifurcation. 4. These results were called by telephone at the time of interpretation on 05/03/2018 at 12:50 pm to Dr. Benjiman Core , who verbally acknowledged these results. Electronically Signed   By: Marlan Palau M.D.   On: 05/03/2018 12:51   Ct Angio Neck W And/or Wo Contrast  Result Date: 05/03/2018 CLINICAL DATA:  Code stroke.  Aphasia and diaphoresis EXAM: CT ANGIOGRAPHY HEAD AND NECK TECHNIQUE: Multidetector CT imaging of the head and neck was performed using the standard protocol during bolus administration of intravenous contrast. Multiplanar CT image reconstructions and MIPs were obtained to evaluate the vascular anatomy. Carotid stenosis measurements (when applicable) are obtained utilizing NASCET criteria, using the distal internal carotid diameter as the denominator. CONTRAST:   OMNIPAQUE IOHEXOL 350 MG/ML SOLN COMPARISON:  CT head 05/23/2018 FINDINGS: CTA NECK FINDINGS Aortic arch: Mild atherosclerotic disease aortic arch. Proximal great vessels widely patent. Right carotid system: Mild atherosclerotic disease right carotid bifurcation without significant stenosis. Left carotid system: Atherosclerotic disease left carotid bifurcation. Approximately 25%  diameter stenosis proximal left internal carotid artery due to calcific disease. Vertebral arteries: Both vertebral arteries patent to the basilar. Mild atherosclerotic disease at the origin of the right vertebral artery with mild stenosis. Skeleton: No acute skeletal abnormality. Mild degenerative changes in the cervical spine. Other neck: Negative Upper chest: Lung apices clear bilaterally. Review of the MIP images confirms the above findings CTA HEAD FINDINGS Anterior circulation: Mild atherosclerotic disease in the cavernous carotid bilaterally without stenosis. Anterior and middle cerebral arteries patent without large vessel occlusion. Mild stenosis left MCA bifurcation. Posterior circulation: Both vertebral arteries patent to the basilar. Basilar widely patent. PICA, superior cerebellar, and posterior cerebral arteries patent bilaterally. Moderate stenosis right P1 and P2 segments. Left posterior cerebral artery patent without stenosis. Venous sinuses: Patent Anatomic variants: None Delayed phase: No enhancing lesions postcontrast infusion. Review of the MIP images confirms the above findings IMPRESSION: 1. Negative for emergent large vessel occlusion. 2. Approximately 25% diameter stenosis proximal left internal carotid artery. Right carotid artery widely patent. Mild stenosis origin of right vertebral artery 3. Moderate stenosis right P1 segment and P2 segments. Mild stenosis left MCA bifurcation. 4. These results were called by telephone at the time of interpretation on 05/03/2018 at 12:50 pm to Dr. Benjiman Core , who verbally acknowledged these results. Electronically Signed   By: Marlan Palau M.D.   On: 05/03/2018 12:51   Mr Brain Wo Contrast  Result Date: 05/03/2018 CLINICAL DATA:  Unresponsiveness. Aphasia and right-sided weakness. Prior stroke. EXAM: MRI HEAD WITHOUT CONTRAST TECHNIQUE: Multiplanar, multiecho pulse sequences of the brain and surrounding structures were obtained without  intravenous contrast. COMPARISON:  Head CT and CTA 05/03/2018 and MRI 05/02/2012 FINDINGS: Brain: There is no evidence of acute infarct, intracranial hemorrhage, mass, midline shift, or extra-axial fluid collection. The ventricles and sulci are within normal limits for age. A small chronic infarct is again noted in the right cerebellum. There is minimal gliosis at the site of the acute left frontoparietal infarct on the prior MRI. No significant chronic white matter disease is seen elsewhere for age. Vascular: Major intracranial vascular flow voids are preserved. Skull and upper cervical spine: No suspicious marrow lesion. Sinuses/Orbits: Unremarkable orbits. Extensive left maxillary sinus mucosal thickening. Clear mastoid air cells. Other: None. IMPRESSION: 1. No acute intracranial abnormality. 2. Small chronic right cerebellar infarct. Electronically Signed   By: Sebastian Ache M.D.   On: 05/03/2018 15:56   US Carotid Bilateral (at [facility] And [facility] Only)  Result Date: 05/03/2018 CLINICAL DATA:  Aphasia, mental status change and carotid atherosclerosis by CTA. EXAM: BILATERAL CAROTID DUPLEX ULTRASOUND TECHNIQUE: Wallace Cullens scale imaging, color Doppler and duplex ultrasound were performed of bilateral carotid and vertebral arteries in the neck. COMPARISON:  CTA of the neck earlier today. FINDINGS: Criteria: Quantification of carotid stenosis is based on velocity parameters that correlate the residual internal carotid diameter with NASCET-based stenosis levels, using the diameter of the distal internal carotid lumen as the denominator for stenosis measurement. The following velocity measurements were obtained: RIGHT ICA:  52/14 cm/sec CCA:  55/10 cm/sec SYSTOLIC ICA/CCA RATIO:  0.9 ECA:  73 cm/sec LEFT ICA:  54/20 cm/sec CCA:  65/14 cm/sec SYSTOLIC ICA/CCA RATIO:  0.8 ECA:  60 cm/sec  RIGHT CAROTID ARTERY: Mild amount of partially calcified plaque is present at the level of the carotid bulb and at the right ICA origin.  Estimated right ICA stenosis is less than 50%. RIGHT VERTEBRAL ARTERY: Antegrade flow with normal waveform and velocity. LEFT CAROTID ARTERY: There is a mild amount of predominately calcified plaque at the level of the left carotid bulb and proximal left ICA. Estimated left ICA stenosis is less than 50%. LEFT VERTEBRAL ARTERY: Antegrade flow with normal waveform and velocity. IMPRESSION: Mild amount of plaque at the level of both carotid bulbs and proximal internal carotid arteries. Estimated bilateral ICA stenoses are less than 50%. Electronically Signed   By: Irish Lack M.D.   On: 05/03/2018 15:22   Mr Maxine Glenn Head/brain VQ Cm  Result Date: 05/03/2018 CLINICAL DATA:  Unresponsiveness. Aphasia and right-sided weakness. Prior stroke. EXAM: MRA HEAD WITHOUT CONTRAST TECHNIQUE: Angiographic images of the Circle of Willis were obtained using MRA technique without intravenous contrast. COMPARISON:  Head and neck CTA 05/03/2018 FINDINGS: The visualized distal vertebral arteries are widely patent to the basilar. Patent PICA and SCA origins are seen bilaterally. The basilar artery is widely patent. The PCAs are patent with moderate to severe proximal and distal P1 stenoses again seen on the right. The internal carotid arteries are widely patent from skull base to carotid termini. ACAs and MCAs are patent without evidence of proximal branch occlusion or significant A1 or M1 stenosis. Intermittent artifact is noted through the MCA branch vessels with M2 origin stenosis better evaluated on earlier CTA. No aneurysm is identified. IMPRESSION: 1. Patent anterior and posterior intracranial circulation without major branch occlusion. 2. Moderate to severe proximal right PCA stenoses. Electronically Signed   By: Sebastian Ache M.D.   On: 05/03/2018 16:06   Ct Head Code Stroke Wo Contrast  Result Date: 05/03/2018 CLINICAL DATA:  Code stroke. 78 year old male with aphasia and diaphoresis last known well 30 minutes ago. EXAM: CT  HEAD WITHOUT CONTRAST TECHNIQUE: Contiguous axial images were obtained from the base of the skull through the vertex without intravenous contrast. COMPARISON:  Head CT 10/19/2017 and earlier. FINDINGS: Brain: Small chronic infarct in the posterior right cerebellum is stable. Mild for age patchy white matter hypodensity. No midline shift, ventriculomegaly, mass effect, evidence of mass lesion, intracranial hemorrhage or evidence of cortically based acute infarction. Vascular: Calcified atherosclerosis at the skull base. No suspicious intracranial vascular hyperdensity. Skull: Negative. Sinuses/Orbits: Chronic left maxillary sinus disease. Other Visualized paranasal sinuses and mastoids are stable and well pneumatized. Other: No acute orbit or scalp soft tissue findings. ASPECTS [facility] Stroke Program Early CT Score) - Ganglionic level infarction (caudate, lentiform nuclei, internal capsule, insula, M1-M3 cortex): 7 - Supraganglionic infarction (M4-M6 cortex): 3 Total score (0-10 with 10 being normal): 10 IMPRESSION: 1. No acute cortically based infarct or acute intracranial hemorrhage identified. ASPECTS is 10. 2. Small chronic right cerebellar infarct. 3. Study discussed by telephone with Dr. Benjiman Core on 05/03/2018 at 12:27 . Electronically Signed   By: Odessa Fleming M.D.   On: 05/03/2018 12:27      Subjective: No dizziness or lightheadedness.  No chest pain.  Wants to return home.  Discharge Exam: Vitals:   05/06/18 0030 05/06/18 0402 05/06/18 0800 05/06/18 1200  BP: (!) 145/99 139/69 128/74 133/89  Pulse: (!) 51 (!) 46 71 (!) 58  Resp: 14 16 18    Temp: 97.9 F (36.6 C) (!) 97.4 F (36.3 C) 97.7 F (36.5 C) 97.9 F (36.6 C)  TempSrc:  Oral Oral Oral Oral  SpO2: 97% 98% 98% 100%  Weight:      Height:        General: Pt is alert, awake, not in acute distress Cardiovascular: irregular, S1/S2 +, no rubs, no gallops Respiratory: CTA bilaterally, no wheezing, no rhonchi Abdominal: Soft, NT,  ND, bowel sounds + Extremities: no edema, no cyanosis    The results of significant diagnostics from this hospitalization (including imaging, microbiology, ancillary and laboratory) are listed below for reference.     Microbiology: No results found for this or any previous visit (from the past 240 hour(s)).   Labs: BNP (last 3 results) No results for input(s): BNP in the last 8760 hours. Basic Metabolic Panel: Recent Labs  Lab 05/03/18 1214 05/04/18 0438  NA 141 141  K 4.3 4.0  CL 111 114*  CO2 22 21*  GLUCOSE 126* 103*  BUN 17 16  CREATININE 1.31* 1.01  CALCIUM 9.4 9.0   Liver Function Tests: Recent Labs  Lab 05/03/18 1214 05/04/18 0438  AST 20 17  ALT 17 15  ALKPHOS 42 35*  BILITOT 1.5* 1.5*  PROT 6.6 5.6*  ALBUMIN 3.6 3.2*   No results for input(s): LIPASE, AMYLASE in the last 168 hours. No results for input(s): AMMONIA in the last 168 hours. CBC: Recent Labs  Lab 05/03/18 1214  WBC 7.6  NEUTROABS 4.0  HGB 15.1  HCT 47.9  MCV 99.8  PLT 167   Cardiac Enzymes: No results for input(s): CKTOTAL, CKMB, CKMBINDEX, TROPONINI in the last 168 hours. BNP: Invalid input(s): POCBNP CBG: Recent Labs  Lab 05/03/18 1208 05/04/18 2041  GLUCAP 122* 102*   D-Dimer No results for input(s): DDIMER in the last 72 hours. Hgb A1c No results for input(s): HGBA1C in the last 72 hours. Lipid Profile Recent Labs    05/04/18 0438  CHOL 145  HDL 37*  LDLCALC 98  TRIG 52  CHOLHDL 3.9   Thyroid function studies No results for input(s): TSH, T4TOTAL, T3FREE, THYROIDAB in the last 72 hours.  Invalid input(s): FREET3 Anemia work up No results for input(s): VITAMINB12, FOLATE, FERRITIN, TIBC, IRON, RETICCTPCT in the last 72 hours. Urinalysis    Component Value Date/Time   COLORURINE YELLOW 05/03/2018 1355   APPEARANCEUR CLEAR 05/03/2018 1355   LABSPEC >1.046 (H) 05/03/2018 1355   PHURINE 7.0 05/03/2018 1355   GLUCOSEU NEGATIVE 05/03/2018 1355   HGBUR LARGE  (A) 05/03/2018 1355   BILIRUBINUR NEGATIVE 05/03/2018 1355   KETONESUR 5 (A) 05/03/2018 1355   PROTEINUR 30 (A) 05/03/2018 1355   UROBILINOGEN 0.2 05/01/2012 2115   NITRITE NEGATIVE 05/03/2018 1355   LEUKOCYTESUR NEGATIVE 05/03/2018 1355   Sepsis Labs Invalid input(s): PROCALCITONIN,  WBC,  LACTICIDVEN Microbiology No results found for this or any previous visit (from the past 240 hour(s)).   Time coordinating discharge:  SIGNED:   Erick Blinks, MD  [facility] 05/06/2018, 2:38 PM   If 7PM-7AM, please contact night-coverage [email or web address]

## 2018-05-12 ENCOUNTER — Telehealth: Payer: Self-pay | Admitting: Internal Medicine

## 2018-05-12 NOTE — Telephone Encounter (Signed)
Patient states hospitalized for a few days and per discharge instructions was to stop taking metoprolol succinate. He would like to know if he is able to resume. Advised pt to follow discharge instructions and continue to hold metoprolol succinate until after discussing with a provider. Pt scheduled for appt 3/31 with Lyman Bishop, DNP and message to be routed to Dr. Rennis Golden for review.

## 2018-05-12 NOTE — Telephone Encounter (Signed)
Patient passed out last week and was taken into [facility] and they took him off of metoprolol succinate (TOPROL-XL), he said his HR was in the 30's.  He said he has been checking his HR and it is back in the 60's now.  He wants to know if he should take the medication again if his HR gest high again and what is a high HR.  You can leave him a detailed voicemail.

## 2018-05-12 NOTE — Telephone Encounter (Signed)
LMTCB

## 2018-05-12 NOTE — Telephone Encounter (Signed)
F/U Message           Patient returned your call. Patient would like a call back. This is for Ross Lewis)

## 2018-05-15 NOTE — Telephone Encounter (Signed)
Spoke with pt, aware of normal heart rate. His heart rate is running in the 60's. He was told not to restart metoprolol. He will continue to monitor and call with concerns.

## 2018-05-15 NOTE — Telephone Encounter (Signed)
Thanks .. I agree. Ross Lewis will review meds with her at follow-up.  Dr Rexene Edison

## 2018-05-30 ENCOUNTER — Ambulatory Visit: Payer: Medicare Other | Admitting: Adult Health

## 2018-06-09 ENCOUNTER — Inpatient Hospital Stay (HOSPITAL_COMMUNITY)
Admission: EM | Admit: 2018-06-09 | Discharge: 2018-06-30 | DRG: 207 | Disposition: E | Payer: Medicare Other | Attending: Internal Medicine | Admitting: Internal Medicine

## 2018-06-09 ENCOUNTER — Emergency Department (HOSPITAL_COMMUNITY): Payer: Medicare Other

## 2018-06-09 DIAGNOSIS — N182 Chronic kidney disease, stage 2 (mild): Secondary | ICD-10-CM | POA: Diagnosis present

## 2018-06-09 DIAGNOSIS — Z9911 Dependence on respirator [ventilator] status: Secondary | ICD-10-CM

## 2018-06-09 DIAGNOSIS — J1289 Other viral pneumonia: Secondary | ICD-10-CM | POA: Diagnosis not present

## 2018-06-09 DIAGNOSIS — E1165 Type 2 diabetes mellitus with hyperglycemia: Secondary | ICD-10-CM | POA: Diagnosis present

## 2018-06-09 DIAGNOSIS — T45515A Adverse effect of anticoagulants, initial encounter: Secondary | ICD-10-CM | POA: Diagnosis present

## 2018-06-09 DIAGNOSIS — J9621 Acute and chronic respiratory failure with hypoxia: Secondary | ICD-10-CM | POA: Diagnosis not present

## 2018-06-09 DIAGNOSIS — G931 Anoxic brain damage, not elsewhere classified: Secondary | ICD-10-CM | POA: Diagnosis present

## 2018-06-09 DIAGNOSIS — J9601 Acute respiratory failure with hypoxia: Secondary | ICD-10-CM | POA: Diagnosis present

## 2018-06-09 DIAGNOSIS — E876 Hypokalemia: Secondary | ICD-10-CM | POA: Diagnosis present

## 2018-06-09 DIAGNOSIS — E1122 Type 2 diabetes mellitus with diabetic chronic kidney disease: Secondary | ICD-10-CM | POA: Diagnosis present

## 2018-06-09 DIAGNOSIS — E870 Hyperosmolality and hypernatremia: Secondary | ICD-10-CM | POA: Diagnosis not present

## 2018-06-09 DIAGNOSIS — G9349 Other encephalopathy: Secondary | ICD-10-CM | POA: Diagnosis present

## 2018-06-09 DIAGNOSIS — L8990 Pressure ulcer of unspecified site, unspecified stage: Secondary | ICD-10-CM

## 2018-06-09 DIAGNOSIS — Z515 Encounter for palliative care: Secondary | ICD-10-CM | POA: Diagnosis not present

## 2018-06-09 DIAGNOSIS — J323 Chronic sphenoidal sinusitis: Secondary | ICD-10-CM | POA: Diagnosis present

## 2018-06-09 DIAGNOSIS — Z8673 Personal history of transient ischemic attack (TIA), and cerebral infarction without residual deficits: Secondary | ICD-10-CM

## 2018-06-09 DIAGNOSIS — S27329A Contusion of lung, unspecified, initial encounter: Secondary | ICD-10-CM | POA: Diagnosis not present

## 2018-06-09 DIAGNOSIS — E8770 Fluid overload, unspecified: Secondary | ICD-10-CM | POA: Diagnosis present

## 2018-06-09 DIAGNOSIS — I469 Cardiac arrest, cause unspecified: Secondary | ICD-10-CM | POA: Diagnosis present

## 2018-06-09 DIAGNOSIS — Z87442 Personal history of urinary calculi: Secondary | ICD-10-CM

## 2018-06-09 DIAGNOSIS — R570 Cardiogenic shock: Secondary | ICD-10-CM | POA: Diagnosis present

## 2018-06-09 DIAGNOSIS — I472 Ventricular tachycardia: Secondary | ICD-10-CM | POA: Diagnosis present

## 2018-06-09 DIAGNOSIS — I4819 Other persistent atrial fibrillation: Secondary | ICD-10-CM | POA: Diagnosis not present

## 2018-06-09 DIAGNOSIS — I4891 Unspecified atrial fibrillation: Secondary | ICD-10-CM | POA: Diagnosis present

## 2018-06-09 DIAGNOSIS — R040 Epistaxis: Secondary | ICD-10-CM | POA: Diagnosis not present

## 2018-06-09 DIAGNOSIS — G8929 Other chronic pain: Secondary | ICD-10-CM | POA: Diagnosis present

## 2018-06-09 DIAGNOSIS — R0902 Hypoxemia: Secondary | ICD-10-CM

## 2018-06-09 DIAGNOSIS — Z79899 Other long term (current) drug therapy: Secondary | ICD-10-CM

## 2018-06-09 DIAGNOSIS — R400 Somnolence: Secondary | ICD-10-CM | POA: Diagnosis not present

## 2018-06-09 DIAGNOSIS — J181 Lobar pneumonia, unspecified organism: Secondary | ICD-10-CM | POA: Diagnosis not present

## 2018-06-09 DIAGNOSIS — J156 Pneumonia due to other aerobic Gram-negative bacteria: Secondary | ICD-10-CM | POA: Diagnosis present

## 2018-06-09 DIAGNOSIS — I6789 Other cerebrovascular disease: Secondary | ICD-10-CM | POA: Diagnosis not present

## 2018-06-09 DIAGNOSIS — Z66 Do not resuscitate: Secondary | ICD-10-CM | POA: Diagnosis not present

## 2018-06-09 DIAGNOSIS — J189 Pneumonia, unspecified organism: Secondary | ICD-10-CM

## 2018-06-09 DIAGNOSIS — Z7901 Long term (current) use of anticoagulants: Secondary | ICD-10-CM

## 2018-06-09 DIAGNOSIS — I129 Hypertensive chronic kidney disease with stage 1 through stage 4 chronic kidney disease, or unspecified chronic kidney disease: Secondary | ICD-10-CM | POA: Diagnosis present

## 2018-06-09 DIAGNOSIS — I4901 Ventricular fibrillation: Secondary | ICD-10-CM | POA: Diagnosis present

## 2018-06-09 DIAGNOSIS — D696 Thrombocytopenia, unspecified: Secondary | ICD-10-CM | POA: Diagnosis present

## 2018-06-09 DIAGNOSIS — I639 Cerebral infarction, unspecified: Secondary | ICD-10-CM | POA: Diagnosis present

## 2018-06-09 DIAGNOSIS — J80 Acute respiratory distress syndrome: Secondary | ICD-10-CM | POA: Diagnosis not present

## 2018-06-09 DIAGNOSIS — E871 Hypo-osmolality and hyponatremia: Secondary | ICD-10-CM | POA: Diagnosis not present

## 2018-06-09 DIAGNOSIS — Z452 Encounter for adjustment and management of vascular access device: Secondary | ICD-10-CM

## 2018-06-09 DIAGNOSIS — Z4659 Encounter for fitting and adjustment of other gastrointestinal appliance and device: Secondary | ICD-10-CM

## 2018-06-09 DIAGNOSIS — Z0189 Encounter for other specified special examinations: Secondary | ICD-10-CM

## 2018-06-09 DIAGNOSIS — R0682 Tachypnea, not elsewhere classified: Secondary | ICD-10-CM

## 2018-06-09 HISTORY — DX: Chronic kidney disease, unspecified: N18.9

## 2018-06-09 HISTORY — DX: Cerebral infarction, unspecified: I63.9

## 2018-06-09 HISTORY — DX: Essential (primary) hypertension: I10

## 2018-06-09 LAB — CBC WITH DIFFERENTIAL/PLATELET
Abs Immature Granulocytes: 0.19 10*3/uL — ABNORMAL HIGH (ref 0.00–0.07)
Basophils Absolute: 0 10*3/uL (ref 0.0–0.1)
Basophils Relative: 0 %
Eosinophils Absolute: 0.1 10*3/uL (ref 0.0–0.5)
Eosinophils Relative: 1 %
HCT: 44.9 % (ref 39.0–52.0)
Hemoglobin: 14 g/dL (ref 13.0–17.0)
Immature Granulocytes: 2 %
Lymphocytes Relative: 34 %
Lymphs Abs: 4 10*3/uL (ref 0.7–4.0)
MCH: 32 pg (ref 26.0–34.0)
MCHC: 31.2 g/dL (ref 30.0–36.0)
MCV: 102.5 fL — ABNORMAL HIGH (ref 80.0–100.0)
Monocytes Absolute: 0.8 10*3/uL (ref 0.1–1.0)
Monocytes Relative: 7 %
Neutro Abs: 6.8 10*3/uL (ref 1.7–7.7)
Neutrophils Relative %: 56 %
Platelets: 144 10*3/uL — ABNORMAL LOW (ref 150–400)
RBC: 4.38 MIL/uL (ref 4.22–5.81)
RDW: 13.6 % (ref 11.5–15.5)
WBC: 11.9 10*3/uL — ABNORMAL HIGH (ref 4.0–10.5)
nRBC: 0 % (ref 0.0–0.2)

## 2018-06-09 LAB — POCT I-STAT 7, (LYTES, BLD GAS, ICA,H+H)
Acid-base deficit: 15 mmol/L — ABNORMAL HIGH (ref 0.0–2.0)
Bicarbonate: 11.6 mmol/L — ABNORMAL LOW (ref 20.0–28.0)
Calcium, Ion: 0.84 mmol/L — CL (ref 1.15–1.40)
HCT: 27 % — ABNORMAL LOW (ref 39.0–52.0)
Hemoglobin: 9.2 g/dL — ABNORMAL LOW (ref 13.0–17.0)
O2 Saturation: 98 %
Patient temperature: 93.6
Potassium: 2 mmol/L — CL (ref 3.5–5.1)
Sodium: 148 mmol/L — ABNORMAL HIGH (ref 135–145)
TCO2: 12 mmol/L — ABNORMAL LOW (ref 22–32)
pCO2 arterial: 24.7 mmHg — ABNORMAL LOW (ref 32.0–48.0)
pH, Arterial: 7.266 — ABNORMAL LOW (ref 7.350–7.450)
pO2, Arterial: 116 mmHg — ABNORMAL HIGH (ref 83.0–108.0)

## 2018-06-09 LAB — COMPREHENSIVE METABOLIC PANEL
ALT: 126 U/L — ABNORMAL HIGH (ref 0–44)
AST: 162 U/L — ABNORMAL HIGH (ref 15–41)
Albumin: 3 g/dL — ABNORMAL LOW (ref 3.5–5.0)
Alkaline Phosphatase: 62 U/L (ref 38–126)
Anion gap: 10 (ref 5–15)
BUN: 14 mg/dL (ref 8–23)
CO2: 18 mmol/L — ABNORMAL LOW (ref 22–32)
Calcium: 8.3 mg/dL — ABNORMAL LOW (ref 8.9–10.3)
Chloride: 114 mmol/L — ABNORMAL HIGH (ref 98–111)
Creatinine, Ser: 1.67 mg/dL — ABNORMAL HIGH (ref 0.61–1.24)
GFR calc Af Amer: 44 mL/min — ABNORMAL LOW (ref >60)
GFR calc non Af Amer: 38 mL/min — ABNORMAL LOW (ref >60)
Glucose, Bld: 203 mg/dL — ABNORMAL HIGH (ref 70–99)
Potassium: 3.8 mmol/L (ref 3.5–5.1)
Sodium: 142 mmol/L (ref 135–145)
Total Bilirubin: 0.9 mg/dL (ref 0.3–1.2)
Total Protein: 5.3 g/dL — ABNORMAL LOW (ref 6.5–8.1)

## 2018-06-09 LAB — TROPONIN I: Troponin I: 0.04 ng/mL (ref <0.03)

## 2018-06-09 LAB — ETHANOL: Alcohol, Ethyl (B): <10 mg/dL (ref <10)

## 2018-06-09 LAB — PROTIME-INR
INR: 3.2 — ABNORMAL HIGH (ref 0.8–1.2)
Prothrombin Time: 31.9 seconds — ABNORMAL HIGH (ref 11.4–15.2)

## 2018-06-09 LAB — LIPASE, BLOOD: Lipase: 40 U/L (ref 11–51)

## 2018-06-09 MED ORDER — SODIUM CHLORIDE 0.9 % IV SOLN
1.0000 ug/kg/min | INTRAVENOUS | Status: DC
  Administered 2018-06-09 – 2018-06-11 (×3): 1 ug/kg/min via INTRAVENOUS
  Filled 2018-06-09 (×4): qty 20

## 2018-06-09 MED ORDER — LIDOCAINE HCL (CARDIAC) PF 100 MG/5ML IV SOSY
PREFILLED_SYRINGE | INTRAVENOUS | Status: AC | PRN
  Administered 2018-06-09 (×3): 100 mg via INTRAVENOUS

## 2018-06-09 MED ORDER — FENTANYL CITRATE (PF) 100 MCG/2ML IJ SOLN: 100.0000 ug | INTRAMUSCULAR | Status: DC | PRN

## 2018-06-09 MED ORDER — FENTANYL BOLUS VIA INFUSION
25.0000 ug | INTRAVENOUS | Status: DC | PRN
  Administered 2018-06-11 – 2018-06-12 (×4): 25 ug via INTRAVENOUS
  Filled 2018-06-09: qty 25

## 2018-06-09 MED ORDER — MAGNESIUM SULFATE 50 % IJ SOLN
INTRAMUSCULAR | Status: AC | PRN
  Administered 2018-06-09: 2 g via INTRAVENOUS

## 2018-06-09 MED ORDER — MIDAZOLAM HCL 2 MG/2ML IJ SOLN: 1.0000 mg | INTRAMUSCULAR | Status: DC | PRN

## 2018-06-09 MED ORDER — NOREPINEPHRINE 4 MG/250ML-% IV SOLN
0.0000 ug/min | INTRAVENOUS | Status: DC
  Administered 2018-06-10: 25 ug/min via INTRAVENOUS
  Administered 2018-06-10: 01:00:00 5 ug/min via INTRAVENOUS
  Administered 2018-06-10: 12:00:00 8 ug/min via INTRAVENOUS
  Administered 2018-06-11 (×2): 12 ug/min via INTRAVENOUS
  Administered 2018-06-11: 5 ug/min via INTRAVENOUS
  Administered 2018-06-12: 22:00:00 4 ug/min via INTRAVENOUS
  Administered 2018-06-12: 10:00:00 5 ug/min via INTRAVENOUS
  Administered 2018-06-13: 12 ug/min via INTRAVENOUS
  Filled 2018-06-09 (×8): qty 250

## 2018-06-09 MED ORDER — SODIUM CHLORIDE 0.9 % IV SOLN
INTRAVENOUS | Status: DC
  Administered 2018-06-10 – 2018-06-17 (×4): via INTRAVENOUS

## 2018-06-09 MED ORDER — DEXTROSE 5 % IV SOLN
300.0000 mg | Freq: Once | INTRAVENOUS | Status: DC
  Filled 2018-06-09: qty 6

## 2018-06-09 MED ORDER — AMIODARONE LOAD VIA INFUSION
150.0000 mg | Freq: Once | INTRAVENOUS | Status: AC
  Administered 2018-06-09: 150 mg via INTRAVENOUS
  Filled 2018-06-09: qty 83.34

## 2018-06-09 MED ORDER — MIDAZOLAM 50MG/50ML (1MG/ML) PREMIX INFUSION
2.0000 mg/h | INTRAVENOUS | Status: DC
  Administered 2018-06-09 – 2018-06-10 (×3): 2 mg/h via INTRAVENOUS
  Administered 2018-06-11 (×2): 4 mg/h via INTRAVENOUS
  Administered 2018-06-11: 5 mg/h via INTRAVENOUS
  Administered 2018-06-12: 11:00:00 4 mg/h via INTRAVENOUS
  Administered 2018-06-13: 7 mg/h via INTRAVENOUS
  Administered 2018-06-13: 19:00:00 2 mg/h via INTRAVENOUS
  Administered 2018-06-13: 5 mg/h via INTRAVENOUS
  Administered 2018-06-14: 4 mg/h via INTRAVENOUS
  Filled 2018-06-09 (×11): qty 50

## 2018-06-09 MED ORDER — CISATRACURIUM BOLUS VIA INFUSION
0.1000 mg/kg | Freq: Once | INTRAVENOUS | Status: AC
  Administered 2018-06-09: 9.1 mg via INTRAVENOUS
  Filled 2018-06-09: qty 10

## 2018-06-09 MED ORDER — FENTANYL 2500MCG IN NS 250ML (10MCG/ML) PREMIX INFUSION
100.0000 ug/h | INTRAVENOUS | Status: DC
  Administered 2018-06-09: 100 ug/h via INTRAVENOUS
  Administered 2018-06-10: 150 ug/h via INTRAVENOUS
  Administered 2018-06-10: 100 ug/h via INTRAVENOUS
  Administered 2018-06-11 (×2): 200 ug/h via INTRAVENOUS
  Administered 2018-06-12: 175 ug/h via INTRAVENOUS
  Administered 2018-06-12: 03:00:00 200 ug/h via INTRAVENOUS
  Administered 2018-06-13: 250 ug/h via INTRAVENOUS
  Administered 2018-06-13: 125 ug/h via INTRAVENOUS
  Administered 2018-06-13: 300 ug/h via INTRAVENOUS
  Administered 2018-06-14: 200 ug/h via INTRAVENOUS
  Filled 2018-06-09 (×10): qty 250

## 2018-06-09 MED ORDER — ASPIRIN 300 MG RE SUPP
300.0000 mg | RECTAL | Status: DC
  Filled 2018-06-09: qty 1

## 2018-06-09 MED ORDER — PROPOFOL 1000 MG/100ML IV EMUL
INTRAVENOUS | Status: AC
  Filled 2018-06-09: qty 100

## 2018-06-09 MED ORDER — SODIUM CHLORIDE 0.9 % IV BOLUS
500.0000 mL | Freq: Once | INTRAVENOUS | Status: AC
  Administered 2018-06-09: 500 mL via INTRAVENOUS

## 2018-06-09 MED ORDER — PHENYLEPHRINE 40 MCG/ML (10ML) SYRINGE FOR IV PUSH (FOR BLOOD PRESSURE SUPPORT)
PREFILLED_SYRINGE | INTRAVENOUS | Status: AC
  Filled 2018-06-09: qty 10

## 2018-06-09 MED ORDER — DEXTROSE 5 % IV SOLN
INTRAVENOUS | Status: AC | PRN
  Administered 2018-06-09: 150 mg via INTRAVENOUS

## 2018-06-09 MED ORDER — ROCURONIUM BROMIDE 50 MG/5ML IV SOLN
INTRAVENOUS | Status: AC | PRN
  Administered 2018-06-09: 100 mg via INTRAVENOUS

## 2018-06-09 MED ORDER — FENTANYL CITRATE (PF) 100 MCG/2ML IJ SOLN
50.0000 ug | Freq: Once | INTRAMUSCULAR | Status: AC
  Administered 2018-06-09: 50 ug via INTRAVENOUS

## 2018-06-09 MED ORDER — LIDOCAINE IN D5W 4-5 MG/ML-% IV SOLN
2.0000 mg/min | INTRAVENOUS | Status: AC
  Administered 2018-06-09: 4 mg/min via INTRAVENOUS
  Administered 2018-06-10: 17:00:00 2 mg/min via INTRAVENOUS
  Administered 2018-06-10 (×2): 4 mg/min via INTRAVENOUS
  Filled 2018-06-09 (×4): qty 500

## 2018-06-09 MED ORDER — AMIODARONE HCL IN DEXTROSE 360-4.14 MG/200ML-% IV SOLN
30.0000 mg/h | INTRAVENOUS | Status: DC
  Administered 2018-06-10 – 2018-06-12 (×5): 30 mg/h via INTRAVENOUS
  Filled 2018-06-09 (×5): qty 200

## 2018-06-09 MED ORDER — LIDOCAINE BOLUS VIA INFUSION
100.0000 mg | Freq: Once | INTRAVENOUS | Status: DC
  Filled 2018-06-09: qty 100

## 2018-06-09 MED ORDER — MIDAZOLAM BOLUS VIA INFUSION
1.0000 mg | INTRAVENOUS | Status: DC | PRN
  Administered 2018-06-11 – 2018-06-12 (×3): 1 mg via INTRAVENOUS
  Filled 2018-06-09: qty 1

## 2018-06-09 MED ORDER — AMIODARONE HCL IN DEXTROSE 360-4.14 MG/200ML-% IV SOLN
60.0000 mg/h | INTRAVENOUS | Status: AC
  Administered 2018-06-09 – 2018-06-10 (×2): 60 mg/h via INTRAVENOUS
  Filled 2018-06-09 (×3): qty 200

## 2018-06-09 MED ORDER — CISATRACURIUM BOLUS VIA INFUSION
0.0500 mg/kg | INTRAVENOUS | Status: AC | PRN
  Administered 2018-06-11: 4.5 mg via INTRAVENOUS
  Filled 2018-06-09: qty 5

## 2018-06-09 MED ORDER — ARTIFICIAL TEARS OPHTHALMIC OINT
1.0000 "application " | TOPICAL_OINTMENT | Freq: Three times a day (TID) | OPHTHALMIC | Status: DC
  Administered 2018-06-10 – 2018-06-13 (×11): 1 via OPHTHALMIC
  Filled 2018-06-09: qty 3.5

## 2018-06-09 MED ORDER — MIDAZOLAM HCL 2 MG/2ML IJ SOLN
1.0000 mg | Freq: Once | INTRAMUSCULAR | Status: AC
  Administered 2018-06-09: 1 mg via INTRAVENOUS

## 2018-06-09 NOTE — ED Provider Notes (Signed)
Emergency Department Provider Note   I have reviewed the triage vital signs and the nursing notes.   HISTORY  Chief Complaint Cardiac Arrest   HPI Ross Lewis is a 78 y.o. male presents to the emergency department for evaluation and as a post CPR patient.  By report, the patient had a witnessed arrest with no bystander CPR for approximately 3 minutes.  The fire department began CPR on arrival and had a shock advised during their resuscitation.  EMS arrived to find the patient in ventricular fibrillation and apneic.  They placed a King Airway and delivered a defibrillating shock.  Patient returned to sinus rhythm.  During this, the patient did receive 1 epinephrine.  During transport the patient had additional ventricular fibrillation and received an additional defibrillating shock in route with immediate ROSC.  Unknown past medical history at this time.  Fire department does say that the patient has had contact with a known COVID positive individual.   Level 5 caveat: Post-CPR  No past medical history on file.  Patient Active Problem List   Diagnosis Date Noted   Cardiac arrest ([name]) 11-Jun-2018   Allergies Patient has no allergy information on record.  No family history on file.  Social History Social History   Tobacco Use   Smoking status: Not on file  Substance Use Topics   Alcohol use: Not on file   Drug use: Not on file    Review of Systems  Level 5 caveat: Post-CPR  ____________________________________________   PHYSICAL EXAM:  VITAL SIGNS: ED Triage Vitals  Enc Vitals Group     BP 2018/06/11 2033 140/81     Pulse Rate 06/11/2018 2033 99     Resp Jun 11, 2018 2033 (!) 23     Temp 2018/06/11 2047 (!) 95.5 F (35.3 C)     Temp Source 06/11/18 2047 Temporal     SpO2 06-11-2018 2033 92 %     Weight --      Height 11-Jun-2018 2059  (1.676 m)    Constitutional: Spontaneous respirations without purposeful movement.  Eyes: Conjunctivae are normal.  Head:  Atraumatic. Mouth/Throat: Mucous membranes are moist. King airway in place.  Neck: No stridor.  Cardiovascular: Sinus tachycardia. Strong peripheral pulse.  Respiratory: Intermittent spontaneous respirations.  Gastrointestinal: Positive distension.  Musculoskeletal: No gross deformities of extremities. Neurologic:  No purposeful movement.  Skin:  Skin is warm, dry and intact.   ____________________________________________   LABS (all labs ordered are listed, but only abnormal results are displayed)  Labs Reviewed  COMPREHENSIVE METABOLIC PANEL - Abnormal; Notable for the following components:      Result Value   Chloride 114 (*)    CO2 18 (*)    Glucose, Bld 203 (*)    Creatinine, Ser 1.67 (*)    Calcium 8.3 (*)    Total Protein 5.3 (*)    Albumin 3.0 (*)    AST 162 (*)    ALT 126 (*)    GFR calc non Af Amer 38 (*)    GFR calc Af Amer 44 (*)    All other components within normal limits  TROPONIN I - Abnormal; Notable for the following components:   Troponin I 0.04 (*)    All other components within normal limits  CBC WITH DIFFERENTIAL/PLATELET - Abnormal; Notable for the following components:   WBC 11.9 (*)    MCV 102.5 (*)    Platelets 144 (*)    Abs Immature Granulocytes 0.19 (*)    All  other components within normal limits  PROTIME-INR - Abnormal; Notable for the following components:   Prothrombin Time 31.9 (*)    INR 3.2 (*)    All other components within normal limits  POCT I-STAT 7, (LYTES, BLD GAS, ICA,H+H) - Abnormal; Notable for the following components:   pH, Arterial 7.266 (*)    pCO2 arterial 24.7 (*)    pO2, Arterial 116.0 (*)    Bicarbonate 11.6 (*)    TCO2 12 (*)    Acid-base deficit 15.0 (*)    Sodium 148 (*)    Potassium 2.0 (*)    Calcium, Ion 0.84 (*)    HCT 27.0 (*)    Hemoglobin 9.2 (*)    All other components within normal limits  LIPASE, BLOOD  ETHANOL  BLOOD GAS, ARTERIAL  TROPONIN I  TROPONIN I  TROPONIN I  BASIC METABOLIC PANEL    PROTIME-INR  PROTIME-INR  APTT  APTT  BLOOD GAS, ARTERIAL  BLOOD GAS, ARTERIAL  CBC   ____________________________________________  EKG   EKG Interpretation  Date/Time:  Friday June 09 2018 20:49:22 EDT Ventricular Rate:  113 PR Interval:    QRS Duration: 111 QT Interval:  313 QTC Calculation: 430 R Axis:   -68 Text Interpretation:  Sinus tachycardia Multiple premature complexes, vent & supraven Consider right atrial enlargement Left axis deviation Repolarization abnormality, prob rate related ST elevation aVR with later depressions. No old for comparison.  Confirmed by Alona BeneLong, Binh Doten [phone]) on 25-Jun-2018 9:08:04 PM       ____________________________________________  RADIOLOGY  Dg Chest Portable 1 View  Result Date: 25-Jun-2018 CLINICAL DATA:  Status post CPR.  Endotracheal tube placement. EXAM: PORTABLE CHEST 1 VIEW COMPARISON:  None. FINDINGS: Mild cardiomegaly is noted. Endotracheal tube is seen projected over tracheal air shadow with distal tip 3 cm above the carina. Nasogastric tube is seen entering stomach. No pneumothorax or pleural effusion is noted. No acute pulmonary disease is noted. Bony thorax is unremarkable. Atherosclerosis of thoracic aorta is noted. IMPRESSION: Endotracheal and nasogastric tubes are in grossly good position. No acute pulmonary abnormality is noted. Aortic Atherosclerosis (ICD10-I70.0). Electronically Signed   By: Lupita RaiderJames  Green Jr, M.D.   On: 026-Apr-2020 21:22    ____________________________________________   PROCEDURES  Procedure(s) performed:   Procedures  CRITICAL CARE Performed by: Maia PlanJoshua G Chantae Soo Total critical care time: 60 minutes Critical care time was exclusive of separately billable procedures and treating other patients. Critical care was necessary to treat or prevent imminent or life-threatening deterioration. Critical care was time spent personally by me on the following activities: development of treatment plan with patient  and/or surrogate as well as nursing, discussions with consultants, evaluation of patient's response to treatment, examination of patient, obtaining history from patient or surrogate, ordering and performing treatments and interventions, ordering and review of laboratory studies, ordering and review of radiographic studies, pulse oximetry and re-evaluation of patient's condition.  Alona BeneJoshua Aslyn Cottman, MD Emergency Medicine   ____________________________________________   INITIAL IMPRESSION / ASSESSMENT AND PLAN / ED COURSE  Pertinent labs & imaging results that were available during my care of the patient were reviewed by me and considered in my medical decision making (see chart for details).   Patient arrives to the ED post-CPR. Presenting rhythm with EMS was V-fib. HDS on arrival without purposeful movement. Patient with sinus rhythm on monitor with ectopy. Will start amiodarone infusion. Discussed the EKG with Dr. Drinda ButtsLowenstern with Cardiology. No STEMI. No Cardiology intervention at this time.   Labs and  imaging reviewed. ETT in good position. No clear infiltrate. Lower PE suspicion. ACS possible with EKG changes but no intervention at this time.   Discussed patient's case with ICU, Dr. Darrick Penna to request admission. Patient and family (if present) updated with plan. Care transferred to ICU service.  I reviewed all nursing notes, vitals, pertinent old records, EKGs, labs, imaging (as available).   The patient's family did arrive after initial stabilizing efforts.  The deny any known COVID exposure.  The patient's wife does work as a Engineer, civil (consulting).  Patient has been feeling well in the past several days.  The arrest was witnessed and the wife began chest compressions on scene.  She describes history of multiple TIAs but states he has been feeling well in the past several days. She describes a recent admit at [facility] but the chart in Epic has not yet crossed over so cannot review.   Victoriano Campion was  evaluated in Emergency Department on 06/08/2018 for the symptoms described in the history of present illness. He was evaluated in the context of the global COVID-19 pandemic, which necessitated consideration that the patient might be at risk for infection with the SARS-CoV-2 virus that causes COVID-19. Institutional protocols and algorithms that pertain to the evaluation of patients at risk for COVID-19 are in a state of rapid change based on information released by regulatory bodies including the CDC and federal and state organizations. These policies and algorithms were followed during the patient's care in the ED. ____________________________________________  FINAL CLINICAL IMPRESSION(S) / ED DIAGNOSES  Final diagnoses:  Cardiac arrest ([name])     MEDICATIONS GIVEN DURING THIS VISIT:  Medications  phenylephrine 0.4-0.9 MG/10ML-% injection (has no administration in time range)  rocuronium (ZEMURON) injection (100 mg Intravenous Given 06/19/2018 2040)  fentaNYL (SUBLIMAZE) injection 100 mcg (has no administration in time range)  fentaNYL (SUBLIMAZE) injection 100 mcg (has no administration in time range)  midazolam (VERSED) injection 1 mg (has no administration in time range)  midazolam (VERSED) injection 1 mg (has no administration in time range)  amiodarone (NEXTERONE) 1.8 mg/mL load via infusion 150 mg (150 mg Intravenous Bolus from Bag 06/04/2018 2128)    Followed by  amiodarone (NEXTERONE PREMIX) 360-4.14 MG/200ML-% (1.8 mg/mL) IV infusion (60 mg/hr Intravenous New Bag/Given 06/06/2018 2128)    Followed by  amiodarone (NEXTERONE PREMIX) 360-4.14 MG/200ML-% (1.8 mg/mL) IV infusion (has no administration in time range)  amiodarone (CORDARONE) 150 mg in dextrose 5 % 100 mL bolus (150 mg Intravenous New Bag/Given 06/13/2018 2144)  magnesium sulfate (IV Push/IM) injection (2 g Intravenous Given 06/11/2018 2147)  lidocaine (cardiac) 100 mg/51mL (XYLOCAINE) injection 2% (100 mg Intravenous Given 06/12/2018 2224)    norepinephrine (LEVOPHED)  in premix infusion (has no administration in time range)  aspirin suppository 300 mg (has no administration in time range)  cisatracurium (NIMBEX) bolus via infusion 9.1 mg (has no administration in time range)    And  cisatracurium (NIMBEX) 200 mg in sodium chloride 0.9 % 200 mL (1 mg/mL) infusion (has no administration in time range)    And  cisatracurium (NIMBEX) bolus via infusion 4.5 mg (has no administration in time range)  artificial tears (LACRILUBE) ophthalmic ointment 1 application (has no administration in time range)  fentaNYL (SUBLIMAZE) injection 50 mcg (has no administration in time range)  fentaNYL in NS (85mcg/ml) infusion-PREMIX (has no administration in time range)  fentaNYL (SUBLIMAZE) bolus via infusion 25 mcg (has no administration in time range)  midazolam (VERSED) injection 1  mg (has no administration in time range)  midazolam (VERSED) 50 mg/50 mL (1 mg/mL) premix infusion (has no administration in time range)  midazolam (VERSED) bolus via infusion 1 mg (has no administration in time range)  0.9 %  sodium chloride infusion (has no administration in time range)  lidocaine (XYLOCAINE) 4 mg/mL bolus via infusion 100 mg (has no administration in time range)    And  lidocaine (cardiac) 2000 mg in dextrose 5% 500 mL (4mg /mL) IV infusion (4 mg/min Intravenous New Bag/Given 06-15-2018 2222)  sodium chloride 0.9 % bolus 500 mL (0 mLs Intravenous Stopped 2018-06-15 2114)    Note:  This document was prepared using Dragon voice recognition software and may include unintentional dictation errors.  Alona Bene, MD Emergency Medicine    Jahnia Hewes, Arlyss Repress, MD 06/15/2018 [phone]

## 2018-06-09 NOTE — Code Documentation (Signed)
CCM at bedside, pt having long runs of vtach, given order for second bolus of amiodarone and 2mg  of mag

## 2018-06-09 NOTE — ED Triage Notes (Addendum)
Pt comes via [facility], found in vfib arrest by fire, shocked once, CPR initiated at 1932, 3 epis given, ROSC obtained after appx 15 minutes, pt then rearrested in vtach, shocked again and then converted to ST. Given 5 versed PTA due to biting king airway. Hx of being around covid positive person

## 2018-06-09 NOTE — H&P (Addendum)
NAME:  Ross Lewis, MRN:  [id number], DOB:  [birth year], LOS: 0 ADMISSION DATE:  2018-04-17, CONSULTATION DATE:  06-10-18 REFERRING MD:  ED, CHIEF COMPLAINT:  Cardiac arrest   Brief History   Cardiac arrest on hypothermia protocol; r/o covid  History of present illness   78 yo male history of afib with witness arrest cpr initiated by fire dept.  Pt was intubated with king airway and with initial rhythm vfib with defib and ROSC within 15 minutes.  Another vfib arrest in transport with defib shock and immediate ROSC.  Fire dept notes patient had know COVID contact.    Past Medical History  unknown  Significant Hospital Events   ETT 410/20 vfib arrest x2 defib Hypothermia protocol 06-10-18  Consults:    Procedures:  ETT 06-10-18 Left art line 06-10-18 R IJ 06/10/18  Significant Diagnostic Tests:  CXR 06-10-18 unremarkable  Micro Data:    Antimicrobials:     Interim history/subjective:    Objective   Blood pressure (!) 149/72, pulse 80, temperature (!) 93 F (33.9 C), resp. rate 16, height 5\' 6"  (1.676 m), weight 90.7 kg, SpO2 95 %.    Vent Mode: PRVC FiO2 (%):  [100 %] 100 % Set Rate:  [18 bmp] 18 bmp Vt Set:  [480 mL] 480 mL PEEP:  [5 cmH20] 5 cmH20   Intake/Output Summary (Last 24 hours) at 2018-04-17 2247 Last data filed at 2018-04-17 2114 Gross per 24 hour  Intake 500 ml  Output -  Net 500 ml   Filed Weights   004-11-20 2205  Weight: 90.7 kg    Examination: General: intubated , not sedated, no purposeful movvement HENT: constricted pupils,  Lungs: clear to auscultation Cardiovascular: RRR no m/r/g Abdomen: soft nt nd bs+ Extremities: no le edema Neuro: no purposefull movement GU: foley in place  Resolved Hospital Problem list     Assessment & Plan:  78 year old male unknown medical history presents with vfib arrest  Cardiac arrest- vfib arrest in field, in and out of vtach with pulse in ED.  Etiology uncertain.  Hd stable.   - will continue  trend trop and lactate - will continue lidocaine gtt , amiodarone gtt  - will continue hypothermia protocol 33 degree protocol - will continue ventilation for airway protection  - will consider cardiology consult/echo in morning   covid rule out- fire dept mentions patient had known covid contact.  Wife is unaware of such contact - pending covid rule out   Neuro  - will need to consider head ct ; possibly when covid ruled out   afib- nsr, inr 3.2  - will start heparin gtt when INR <2 at 8 units/kg/hr per pharmacy  Best practice:  Diet: npo  Pain/Anxiety/Delirium protocol (if indicated): nimbex, fentanyl gtt VAP protocol (if indicated): yes DVT prophylaxis: heparin gtt GI prophylaxis: none Glucose control: ssi Mobility:  Code Status: full code Family Communication:  Discussed with wife beverly.  Continue full support reevaluate after hypothermia protocol Disposition: ICU  Labs   CBC: Recent Labs  Lab 004-11-20 2054 004-11-20 2159  WBC 11.9*  --   NEUTROABS 6.8  --   HGB 14.0 9.2*  HCT 44.9 27.0*  MCV 102.5*  --   PLT 144*  --     Basic Metabolic Panel: Recent Labs  Lab 004-11-20 2054 004-11-20 2159  NA 142 148*  K 3.8 2.0*  CL 114*  --   CO2 18*  --   GLUCOSE 203*  --  BUN 14  --   CREATININE 1.67*  --   CALCIUM 8.3*  --    GFR: Estimated Creatinine Clearance: 37.8 mL/min (A) (by C-G formula based on SCr of 1.67 mg/dL (H)). Recent Labs  Lab 06/18/2018 2054  WBC 11.9*    Liver Function Tests: Recent Labs  Lab 06/16/2018 2054  AST 162*  ALT 126*  ALKPHOS 62  BILITOT 0.9  PROT 5.3*  ALBUMIN 3.0*   Recent Labs  Lab 06/04/2018 2054  LIPASE 40   No results for input(s): AMMONIA in the last 168 hours.  ABG    Component Value Date/Time   PHART 7.266 (L) 05/31/2018 2159   PCO2ART 24.7 (L) 06/06/2018 2159   PO2ART 116.0 (H) 06/01/2018 2159   HCO3 11.6 (L) 06/25/2018 2159   TCO2 12 (L) 06/14/2018 2159   ACIDBASEDEF 15.0 (H) 06/11/2018 2159   O2SAT  98.0 06/05/2018 2159     Coagulation Profile: Recent Labs  Lab 06/14/2018 2054  INR 3.2*    Cardiac Enzymes: Recent Labs  Lab 06/16/2018 2054  TROPONINI 0.04*    HbA1C: No results found for: HGBA1C  CBG: No results for input(s): GLUCAP in the last 168 hours.  Review of Systems:     Past Medical History  afib s/p cardioversion  Surgical History   No significant    Social History      Family History   His family history is not on file.   Allergies Allergies not on file   Home Medications  Prior to Admission medications   Not on File     Critical care time: 45 minutes

## 2018-06-09 NOTE — ED Provider Notes (Signed)
[facility] Date/Time: 06/11/18 9:20 PM Performed by: Ina Kick, MD Authorized by: Ina Kick, MD   Consent:    Consent obtained:  Emergent situation Indications:    Indications: hemodynamic monitoring and multiple ABGs   Procedure details:    Location:  L radial   Needle gauge:  20 G   Placement technique:  Seldinger and ultrasound guided   Number of attempts:  1   Transducer: waveform confirmed   Post-procedure details:    Post-procedure:  Secured with tape and sterile dressing applied   Patient tolerance of procedure:  Tolerated well, no immediate complications Procedure Name: Intubation Date/Time: 2018/06/11 9:21 PM Performed by: Ina Kick, MD Pre-anesthesia Checklist: Suction available and Patient being monitored Oxygen Delivery Method: Non-rebreather mask Preoxygenation: Pre-oxygenation with 100% oxygen Induction Type: Rapid sequence Laryngoscope Size: 4 Grade View: Grade I Tube size: 7.5 mm Number of attempts: 1 Airway Equipment and Method: Video-laryngoscopy Placement Confirmation: ETT inserted through vocal cords under direct vision and Positive ETCO2 Dental Injury: Bloody posterior oropharynx  Difficulty Due To: Difficulty was anticipated Comments: Endotracheal tube initially to shallow and cuff was seen at the cord opening.  Under direct vision it was advanced several centimeters further to its current position.         Ina Kick, MD 06-11-18 2124    Maia Plan, MD 11-Jun-2018 2303

## 2018-06-09 NOTE — ED Notes (Signed)
Icepacks applied to patient.

## 2018-06-09 NOTE — ED Notes (Signed)
Per CCM initiate code cool

## 2018-06-09 NOTE — Code Documentation (Addendum)
Artic sun pads placed and started

## 2018-06-10 ENCOUNTER — Inpatient Hospital Stay (HOSPITAL_COMMUNITY): Payer: Medicare Other

## 2018-06-10 DIAGNOSIS — R570 Cardiogenic shock: Secondary | ICD-10-CM

## 2018-06-10 LAB — CBC
HCT: 46.1 % (ref 39.0–52.0)
HCT: 47.1 % (ref 39.0–52.0)
HCT: 44.8 % (ref 39.0–52.0)
HCT: 43.5 % (ref 39.0–52.0)
Hemoglobin: 14.9 g/dL (ref 13.0–17.0)
Hemoglobin: 14.8 g/dL (ref 13.0–17.0)
Hemoglobin: 14.3 g/dL (ref 13.0–17.0)
Hemoglobin: 14.3 g/dL (ref 13.0–17.0)
MCH: 33.1 pg (ref 26.0–34.0)
MCH: 32.6 pg (ref 26.0–34.0)
MCH: 32.4 pg (ref 26.0–34.0)
MCH: 31.8 pg (ref 26.0–34.0)
MCHC: 32.3 g/dL (ref 30.0–36.0)
MCHC: 31.4 g/dL (ref 30.0–36.0)
MCHC: 31.9 g/dL (ref 30.0–36.0)
MCHC: 32.9 g/dL (ref 30.0–36.0)
MCV: 102.4 fL — ABNORMAL HIGH (ref 80.0–100.0)
MCV: 103.7 fL — ABNORMAL HIGH (ref 80.0–100.0)
MCV: 101.4 fL — ABNORMAL HIGH (ref 80.0–100.0)
MCV: 96.7 fL (ref 80.0–100.0)
Platelets: 132 10*3/uL — ABNORMAL LOW (ref 150–400)
Platelets: 181 10*3/uL (ref 150–400)
Platelets: DECREASED 10*3/uL (ref 150–400)
Platelets: 120 10*3/uL — ABNORMAL LOW (ref 150–400)
RBC: 4.5 MIL/uL (ref 4.22–5.81)
RBC: 4.54 MIL/uL (ref 4.22–5.81)
RBC: 4.42 MIL/uL (ref 4.22–5.81)
RBC: 4.5 MIL/uL (ref 4.22–5.81)
RDW: 13.8 % (ref 11.5–15.5)
RDW: 13.4 % (ref 11.5–15.5)
RDW: 13.5 % (ref 11.5–15.5)
RDW: 13.3 % (ref 11.5–15.5)
WBC: 10 10*3/uL (ref 4.0–10.5)
WBC: 15.4 10*3/uL — ABNORMAL HIGH (ref 4.0–10.5)
WBC: 12.2 10*3/uL — ABNORMAL HIGH (ref 4.0–10.5)
WBC: 8.9 10*3/uL (ref 4.0–10.5)
nRBC: 0 % (ref 0.0–0.2)
nRBC: 0 % (ref 0.0–0.2)
nRBC: 0 % (ref 0.0–0.2)
nRBC: 0 % (ref 0.0–0.2)

## 2018-06-10 LAB — BASIC METABOLIC PANEL
Anion gap: 12 (ref 5–15)
Anion gap: 15 (ref 5–15)
BUN: 15 mg/dL (ref 8–23)
BUN: 15 mg/dL (ref 8–23)
CO2: 16 mmol/L — ABNORMAL LOW (ref 22–32)
CO2: 15 mmol/L — ABNORMAL LOW (ref 22–32)
Calcium: 8 mg/dL — ABNORMAL LOW (ref 8.9–10.3)
Calcium: 8.1 mg/dL — ABNORMAL LOW (ref 8.9–10.3)
Chloride: 111 mmol/L (ref 98–111)
Chloride: 110 mmol/L (ref 98–111)
Creatinine, Ser: 1.31 mg/dL — ABNORMAL HIGH (ref 0.61–1.24)
Creatinine, Ser: 1.57 mg/dL — ABNORMAL HIGH (ref 0.61–1.24)
GFR calc Af Amer: 60 mL/min — ABNORMAL LOW (ref >60)
GFR calc Af Amer: 48 mL/min — ABNORMAL LOW (ref >60)
GFR calc non Af Amer: 51 mL/min — ABNORMAL LOW (ref >60)
GFR calc non Af Amer: 41 mL/min — ABNORMAL LOW (ref >60)
Glucose, Bld: 309 mg/dL — ABNORMAL HIGH (ref 70–99)
Glucose, Bld: 278 mg/dL — ABNORMAL HIGH (ref 70–99)
Potassium: 3.5 mmol/L (ref 3.5–5.1)
Potassium: 3.2 mmol/L — ABNORMAL LOW (ref 3.5–5.1)
Sodium: 139 mmol/L (ref 135–145)
Sodium: 140 mmol/L (ref 135–145)

## 2018-06-10 LAB — PROTIME-INR
INR: 3.4 — ABNORMAL HIGH (ref 0.8–1.2)
INR: 3.6 — ABNORMAL HIGH (ref 0.8–1.2)
INR: 5.6 (ref 0.8–1.2)
Prothrombin Time: 33.8 seconds — ABNORMAL HIGH (ref 11.4–15.2)
Prothrombin Time: 35 seconds — ABNORMAL HIGH (ref 11.4–15.2)
Prothrombin Time: 49.9 seconds — ABNORMAL HIGH (ref 11.4–15.2)

## 2018-06-10 LAB — GLUCOSE, CAPILLARY
Glucose-Capillary: 292 mg/dL — ABNORMAL HIGH (ref 70–99)
Glucose-Capillary: 263 mg/dL — ABNORMAL HIGH (ref 70–99)
Glucose-Capillary: 275 mg/dL — ABNORMAL HIGH (ref 70–99)
Glucose-Capillary: 262 mg/dL — ABNORMAL HIGH (ref 70–99)
Glucose-Capillary: 234 mg/dL — ABNORMAL HIGH (ref 70–99)
Glucose-Capillary: 216 mg/dL — ABNORMAL HIGH (ref 70–99)
Glucose-Capillary: 200 mg/dL — ABNORMAL HIGH (ref 70–99)
Glucose-Capillary: 199 mg/dL — ABNORMAL HIGH (ref 70–99)
Glucose-Capillary: 181 mg/dL — ABNORMAL HIGH (ref 70–99)
Glucose-Capillary: 173 mg/dL — ABNORMAL HIGH (ref 70–99)
Glucose-Capillary: 162 mg/dL — ABNORMAL HIGH (ref 70–99)
Glucose-Capillary: 149 mg/dL — ABNORMAL HIGH (ref 70–99)
Glucose-Capillary: 132 mg/dL — ABNORMAL HIGH (ref 70–99)
Glucose-Capillary: 117 mg/dL — ABNORMAL HIGH (ref 70–99)
Glucose-Capillary: 119 mg/dL — ABNORMAL HIGH (ref 70–99)
Glucose-Capillary: 128 mg/dL — ABNORMAL HIGH (ref 70–99)
Glucose-Capillary: 130 mg/dL — ABNORMAL HIGH (ref 70–99)

## 2018-06-10 LAB — POCT I-STAT 7, (LYTES, BLD GAS, ICA,H+H)
Acid-base deficit: 11 mmol/L — ABNORMAL HIGH (ref 0.0–2.0)
Acid-base deficit: 11 mmol/L — ABNORMAL HIGH (ref 0.0–2.0)
Acid-base deficit: 16 mmol/L — ABNORMAL HIGH (ref 0.0–2.0)
Acid-base deficit: 14 mmol/L — ABNORMAL HIGH (ref 0.0–2.0)
Acid-base deficit: 4 mmol/L — ABNORMAL HIGH (ref 0.0–2.0)
Bicarbonate: 16.9 mmol/L — ABNORMAL LOW (ref 20.0–28.0)
Bicarbonate: 17.4 mmol/L — ABNORMAL LOW (ref 20.0–28.0)
Bicarbonate: 14.4 mmol/L — ABNORMAL LOW (ref 20.0–28.0)
Bicarbonate: 13 mmol/L — ABNORMAL LOW (ref 20.0–28.0)
Bicarbonate: 19.7 mmol/L — ABNORMAL LOW (ref 20.0–28.0)
Calcium, Ion: 1.14 mmol/L — ABNORMAL LOW (ref 1.15–1.40)
Calcium, Ion: 1.18 mmol/L (ref 1.15–1.40)
Calcium, Ion: 1.19 mmol/L (ref 1.15–1.40)
Calcium, Ion: 1.1 mmol/L — ABNORMAL LOW (ref 1.15–1.40)
Calcium, Ion: 1.16 mmol/L (ref 1.15–1.40)
HCT: 42 % (ref 39.0–52.0)
HCT: 45 % (ref 39.0–52.0)
HCT: 45 % (ref 39.0–52.0)
HCT: 42 % (ref 39.0–52.0)
HCT: 41 % (ref 39.0–52.0)
Hemoglobin: 14.3 g/dL (ref 13.0–17.0)
Hemoglobin: 15.3 g/dL (ref 13.0–17.0)
Hemoglobin: 15.3 g/dL (ref 13.0–17.0)
Hemoglobin: 14.3 g/dL (ref 13.0–17.0)
Hemoglobin: 13.9 g/dL (ref 13.0–17.0)
O2 Saturation: 89 %
O2 Saturation: 97 %
O2 Saturation: 99 %
O2 Saturation: 96 %
O2 Saturation: 87 %
Patient temperature: 32.3
Patient temperature: 33
Patient temperature: 32.9
Patient temperature: 32.9
Patient temperature: 33
Potassium: 3.6 mmol/L (ref 3.5–5.1)
Potassium: 3.7 mmol/L (ref 3.5–5.1)
Potassium: 3.2 mmol/L — ABNORMAL LOW (ref 3.5–5.1)
Potassium: 3.2 mmol/L — ABNORMAL LOW (ref 3.5–5.1)
Potassium: 3.8 mmol/L (ref 3.5–5.1)
Sodium: 141 mmol/L (ref 135–145)
Sodium: 142 mmol/L (ref 135–145)
Sodium: 142 mmol/L (ref 135–145)
Sodium: 144 mmol/L (ref 135–145)
Sodium: 140 mmol/L (ref 135–145)
TCO2: 18 mmol/L — ABNORMAL LOW (ref 22–32)
TCO2: 19 mmol/L — ABNORMAL LOW (ref 22–32)
TCO2: 16 mmol/L — ABNORMAL LOW (ref 22–32)
TCO2: 14 mmol/L — ABNORMAL LOW (ref 22–32)
TCO2: 21 mmol/L — ABNORMAL LOW (ref 22–32)
pCO2 arterial: 34.2 mmHg (ref 32.0–48.0)
pCO2 arterial: 38.7 mmHg (ref 32.0–48.0)
pCO2 arterial: 41.4 mmHg (ref 32.0–48.0)
pCO2 arterial: 27.5 mmHg — ABNORMAL LOW (ref 32.0–48.0)
pCO2 arterial: 27.3 mmHg — ABNORMAL LOW (ref 32.0–48.0)
pH, Arterial: 7.237 — ABNORMAL LOW (ref 7.350–7.450)
pH, Arterial: 7.275 — ABNORMAL LOW (ref 7.350–7.450)
pH, Arterial: 7.123 — CL (ref 7.350–7.450)
pH, Arterial: 7.26 — ABNORMAL LOW (ref 7.350–7.450)
pH, Arterial: 7.449 (ref 7.350–7.450)
pO2, Arterial: 49 mmHg — ABNORMAL LOW (ref 83.0–108.0)
pO2, Arterial: 92 mmHg (ref 83.0–108.0)
pO2, Arterial: 139 mmHg — ABNORMAL HIGH (ref 83.0–108.0)
pO2, Arterial: 76 mmHg — ABNORMAL LOW (ref 83.0–108.0)
pO2, Arterial: 40 mmHg — CL (ref 83.0–108.0)

## 2018-06-10 LAB — POCT I-STAT 4, (NA,K, GLUC, HGB,HCT)
Glucose, Bld: 129 mg/dL — ABNORMAL HIGH (ref 70–99)
HCT: 42 % (ref 39.0–52.0)
Hemoglobin: 14.3 g/dL (ref 13.0–17.0)
Potassium: 2.6 mmol/L — CL (ref 3.5–5.1)
Sodium: 143 mmol/L (ref 135–145)

## 2018-06-10 LAB — HEPATIC FUNCTION PANEL
ALT: 104 U/L — ABNORMAL HIGH (ref 0–44)
AST: 88 U/L — ABNORMAL HIGH (ref 15–41)
Albumin: 2.5 g/dL — ABNORMAL LOW (ref 3.5–5.0)
Alkaline Phosphatase: 32 U/L — ABNORMAL LOW (ref 38–126)
Bilirubin, Direct: 0.1 mg/dL (ref 0.0–0.2)
Indirect Bilirubin: 0.6 mg/dL (ref 0.3–0.9)
Total Bilirubin: 0.7 mg/dL (ref 0.3–1.2)
Total Protein: 4.8 g/dL — ABNORMAL LOW (ref 6.5–8.1)

## 2018-06-10 LAB — MAGNESIUM: Magnesium: 1.7 mg/dL (ref 1.7–2.4)

## 2018-06-10 LAB — LACTIC ACID, PLASMA
Lactic Acid, Venous: 4.5 mmol/L (ref 0.5–1.9)
Lactic Acid, Venous: 7.8 mmol/L (ref 0.5–1.9)

## 2018-06-10 LAB — APTT
aPTT: 41 seconds — ABNORMAL HIGH (ref 24–36)
aPTT: 51 seconds — ABNORMAL HIGH (ref 24–36)

## 2018-06-10 LAB — TROPONIN I
Troponin I: 0.44 ng/mL (ref <0.03)
Troponin I: 0.73 ng/mL (ref <0.03)
Troponin I: 0.96 ng/mL (ref <0.03)
Troponin I: 1.39 ng/mL (ref <0.03)
Troponin I: 1.49 ng/mL (ref <0.03)
Troponin I: 1.55 ng/mL (ref <0.03)

## 2018-06-10 LAB — MRSA PCR SCREENING: MRSA by PCR: NEGATIVE

## 2018-06-10 MED ORDER — MAGNESIUM SULFATE 2 GM/50ML IV SOLN
2.0000 g | Freq: Once | INTRAVENOUS | Status: AC
  Administered 2018-06-10: 2 g via INTRAVENOUS
  Filled 2018-06-10 (×2): qty 50

## 2018-06-10 MED ORDER — CHLORHEXIDINE GLUCONATE 0.12% ORAL RINSE (MEDLINE KIT)
15.0000 mL | Freq: Two times a day (BID) | OROMUCOSAL | Status: DC
  Administered 2018-06-10 – 2018-06-17 (×16): 15 mL via OROMUCOSAL

## 2018-06-10 MED ORDER — ORAL CARE MOUTH RINSE
15.0000 mL | OROMUCOSAL | Status: DC
  Administered 2018-06-10 – 2018-06-17 (×76): 15 mL via OROMUCOSAL

## 2018-06-10 MED ORDER — CHLORHEXIDINE GLUCONATE CLOTH 2 % EX PADS
6.0000 | MEDICATED_PAD | Freq: Every day | CUTANEOUS | Status: DC
  Administered 2018-06-10 – 2018-06-17 (×8): 6 via TOPICAL

## 2018-06-10 MED ORDER — SODIUM CHLORIDE 0.9% FLUSH: 10.0000 mL | INTRAVENOUS | Status: DC | PRN

## 2018-06-10 MED ORDER — POTASSIUM CHLORIDE 10 MEQ/50ML IV SOLN
10.0000 meq | INTRAVENOUS | Status: AC
  Administered 2018-06-10 (×3): 10 meq via INTRAVENOUS
  Filled 2018-06-10 (×3): qty 50

## 2018-06-10 MED ORDER — SODIUM CHLORIDE 0.9 % IV BOLUS
1000.0000 mL | Freq: Once | INTRAVENOUS | Status: AC
  Administered 2018-06-10: 1000 mL via INTRAVENOUS

## 2018-06-10 MED ORDER — INSULIN REGULAR(HUMAN) IN NACL 100-0.9 UT/100ML-% IV SOLN
INTRAVENOUS | Status: DC
  Administered 2018-06-10: 2.6 [IU]/h via INTRAVENOUS
  Administered 2018-06-10: 2 [IU]/h via INTRAVENOUS
  Filled 2018-06-10 (×2): qty 100

## 2018-06-10 MED ORDER — SODIUM CHLORIDE 0.9% FLUSH
10.0000 mL | Freq: Two times a day (BID) | INTRAVENOUS | Status: DC
  Administered 2018-06-10 – 2018-06-17 (×14): 10 mL

## 2018-06-10 MED ORDER — INSULIN ASPART 100 UNIT/ML ~~LOC~~ SOLN
0.0000 [IU] | SUBCUTANEOUS | Status: DC
  Administered 2018-06-10 – 2018-06-14 (×6): 3 [IU] via SUBCUTANEOUS
  Administered 2018-06-14: 22:00:00 4 [IU] via SUBCUTANEOUS
  Administered 2018-06-15 (×4): 3 [IU] via SUBCUTANEOUS
  Administered 2018-06-15 – 2018-06-17 (×12): 4 [IU] via SUBCUTANEOUS

## 2018-06-10 MED ORDER — HEPARIN (PORCINE) 25000 UT/250ML-% IV SOLN: 10.0000 [IU]/kg/h | INTRAVENOUS | Status: DC

## 2018-06-10 MED ORDER — ASPIRIN 300 MG RE SUPP
300.0000 mg | RECTAL | Status: AC
  Administered 2018-06-10: 300 mg via RECTAL
  Filled 2018-06-10: qty 1

## 2018-06-10 MED ORDER — SODIUM BICARBONATE 8.4 % IV SOLN
150.0000 meq | Freq: Once | INTRAVENOUS | Status: AC
  Administered 2018-06-10: 150 meq via INTRAVENOUS
  Filled 2018-06-10: qty 150

## 2018-06-10 MED ORDER — INSULIN ASPART 100 UNIT/ML ~~LOC~~ SOLN: 0.0000 [IU] | Freq: Three times a day (TID) | SUBCUTANEOUS | Status: DC

## 2018-06-10 MED ORDER — PANTOPRAZOLE SODIUM 40 MG IV SOLR
40.0000 mg | INTRAVENOUS | Status: DC
  Administered 2018-06-10 – 2018-06-17 (×8): 40 mg via INTRAVENOUS
  Filled 2018-06-10 (×8): qty 40

## 2018-06-10 MED ORDER — STERILE WATER FOR INJECTION IV SOLN
INTRAVENOUS | Status: DC
  Administered 2018-06-10 (×2): via INTRAVENOUS
  Filled 2018-06-10 (×3): qty 850

## 2018-06-10 MED ORDER — POTASSIUM CHLORIDE 10 MEQ/50ML IV SOLN
10.0000 meq | INTRAVENOUS | Status: AC
  Administered 2018-06-10 (×4): 10 meq via INTRAVENOUS
  Filled 2018-06-10 (×5): qty 50

## 2018-06-10 NOTE — Progress Notes (Signed)
CRITICAL VALUE ALERT  Critical Value:  Troponin 1.39  Date & Time Notied:  06/10/2018 1130  Provider Notified: 06/10/2018 1135  Orders Received/Actions taken

## 2018-06-10 NOTE — Plan of Care (Signed)
Patient stable weaning down levophed throughout the day. Labs/ABGs correcting with bicarb drip. Still continues cooling with nimbex and sedation onboard. Limited echo done by MD. Starting on heparin. Continues on isolation awaiting test results for COVID

## 2018-06-10 NOTE — Progress Notes (Signed)
Wasted 30 cc of versed drip and 250 cc of fentanyl drips with Toniann Fail RN Gilda Crease DNP, MSN, RN

## 2018-06-10 NOTE — Progress Notes (Signed)
NAME:  Ross Lewis, MRN:  [id number], DOB:  1940/12/09 10-Jun-2018, LOS: 1 ADMISSION DATE:  06/10/2018, CONSULTATION DATE: 4/10/ 2020 REFERRING MD: Vallery Ridge, CHIEF COMPLAINT: Status post cardiac arrest  HPI/course in hospital  78 year old man with limited past medical history. Suffered a witnessed cardiac arrest at home.  CPR initiated after 3 minutes.  Defibrillated from ventricular fibrillation.  Spontaneous circulation restored after 15 minutes. Placed in isolation for reported COVID-19 contact. Started on TTM.  +++ Name is incorrect: Correct name is Ross Lewis [birth year] +++  Past Medical History    . Atrial fibrillation ([name]) followed by Dr. Rennis Golden on warfarin.   . Chronic back pain   . CKD (chronic kidney disease), stage II   . Hypertension   . Nephrolithiasis   . Stroke [facility]) followed by Dr. Pearlean Brownie     right cerebellar infarct  . TIA (transient ischemic attack) last in March    Interim history/subjective:  Remains sedated and paralyzed on TTM.  Objective   Blood pressure 120/60, pulse 63, temperature (!) 91.2 F (32.9 C), resp. rate (!) 30, height  (1.676 m), weight 91.8 kg, SpO2 100 %. CVP:  [8 mmHg-14 mmHg] 8 mmHg  Vent Mode: PRVC FiO2 (%):  [80 %-100 %] 100 % Set Rate:  [14 bmp-30 bmp] 30 bmp Vt Set:  [480 mL-510 mL] 510 mL PEEP:  [5 cmH20] 5 cmH20 Plateau Pressure:  [18 cmH20-25 cmH20] 25 cmH20   Intake/Output Summary (Last 24 hours) at 06/10/2018 1657 Last data filed at 06/10/2018 1650 Gross per 24 hour  Intake 6341.69 ml  Output 1670 ml  Net 4671.69 ml   Filed Weights   06/10/2018 2205 06/10/18 0430  Weight: 90.7 kg 91.8 kg    Examination: Physical Exam Constitutional:      Comments: Sedated and chemically paralyzed  HENT:     Mouth/Throat:     Comments: ET tube OG tube in place Eyes:     Pupils: Pupils are equal, round, and reactive to light.  Cardiovascular:     Rate and Rhythm: Regular rhythm. Bradycardia present.    Heart sounds: Normal heart sounds.  Pulmonary:     Breath sounds: Normal breath sounds. No wheezing or rales.     Comments: No ventilator asynchrony.  Acceptable airway pressures. Abdominal:     General: Abdomen is flat.     Palpations: Abdomen is soft.  Skin:    General: Skin is warm.     Capillary Refill: Capillary refill takes 2 to 3 seconds.  Neurological:     Comments: Sedated and chemically paralyzed.  TTM at goal temperature.   I performed a bedside point-of-care echocardiogram which showed normal LV size with hyperdynamic function.  RV dilatation with moderate dysfunction.  No significant valvular abnormalities noted.  No pericardial effusion  Ancillary tests (personally reviewed)  CBC: Recent Labs  Lab June 10, 2018 2054  06/10/2018 2338  06/10/18 0319 06/10/18 0523 06/10/18 0525 06/10/18 0804 06/10/18 0911  WBC 11.9*  --  10.0  --   --   --  15.4*  --  12.2*  NEUTROABS 6.8  --   --   --   --   --   --   --   --   HGB 14.0   < > 14.9   < > 15.3 15.3 14.8 14.3 14.3  HCT 44.9   < > 46.1   < > 45.0 45.0 47.1 42.0 44.8  MCV 102.5*  --  102.4*  --   --   --  103.7*  --  101.4*  PLT 144*  --  132*  --   --   --  181  --  PLATELET CLUMPS NOTED ON SMEAR, COUNT APPEARS DECREASED   < > = values in this interval not displayed.    Basic Metabolic Panel: Recent Labs  Lab October 06, 2018 2054  October 06, 2018 2338 06/10/18 0050 06/10/18 0319 06/10/18 0523 06/10/18 0525 06/10/18 0804 06/10/18 1409  NA 142   < > 139 141 142 142 140 144  --   K 3.8   < > 3.5 3.6 3.7 3.2* 3.2* 3.2*  --   CL 114*  --  111  --   --   --  110  --   --   CO2 18*  --  16*  --   --   --  15*  --   --   GLUCOSE 203*  --  309*  --   --   --  278*  --   --   BUN 14  --  15  --   --   --  15  --   --   CREATININE 1.67*  --  1.31*  --   --   --  1.57*  --   --   CALCIUM 8.3*  --  8.0*  --   --   --  8.1*  --   --   MG  --   --   --   --   --   --   --   --  1.7   < > = values in this interval not displayed.   GFR:  Estimated Creatinine Clearance: 40.5 mL/min (A) (by C-G formula based on SCr of 1.57 mg/dL (H)). Recent Labs  Lab October 06, 2018 2054 October 06, 2018 2338 06/10/18 0208 06/10/18 0525 06/10/18 0911  WBC 11.9* 10.0  --  15.4* 12.2*  LATICACIDVEN  --   --  4.5* 7.8*  --     Liver Function Tests: Recent Labs  Lab October 06, 2018 2054  AST 162*  ALT 126*  ALKPHOS 62  BILITOT 0.9  PROT 5.3*  ALBUMIN 3.0*   Recent Labs  Lab October 06, 2018 2054  LIPASE 40   No results for input(s): AMMONIA in the last 168 hours.  ABG    Component Value Date/Time   PHART 7.260 (L) 06/10/2018 0804   PCO2ART 27.5 (L) 06/10/2018 0804   PO2ART 76.0 (L) 06/10/2018 0804   HCO3 13.0 (L) 06/10/2018 0804   TCO2 14 (L) 06/10/2018 0804   ACIDBASEDEF 14.0 (H) 06/10/2018 0804   O2SAT 96.0 06/10/2018 0804     Coagulation Profile: Recent Labs  Lab October 06, 2018 2054 October 06, 2018 2338 06/10/18 0525  INR 3.2* 3.4* 3.6*    Cardiac Enzymes: Recent Labs  Lab October 06, 2018 2338 06/10/18 0208 06/10/18 0525 06/10/18 0911 06/10/18 1409  TROPONINI 0.44* 0.73* 0.96* 1.39* 1.49*    HbA1C: No results found for: HGBA1C  CBG: Recent Labs  Lab 06/10/18 1223 06/10/18 1304 06/10/18 1354 06/10/18 1502 06/10/18 1618  GLUCAP 173* 162* 149* 132* 117*     Assessment & Plan:  Critically ill due to acute respiratory failure require mechanical ventilation Airway compromise secondary to post arrest ischemic hypoxic encephalopathy Ischemic hypoxic encephalopathy on therapeutic temperature management Critically ill due to vasopressor dependent shock requiring titration of norepinephrine. RV dysfunction present on echocardiogram consistent with possible pulmonary embolism Status post ventricular fibrillation cardiac arrest Atrial fibrillation on warfarin Low  probability COVID rule out Hypokalemia have contributed to arrhythmia  PLAN:  Continue full ventilatory support TTM as per protocol Maintain current level of sedation and  neuromuscular blockade through TM and rewarming phase. Initiate therapeutic IV heparin for suspected PE Titrate norepinephrine to keep MAP>65 (decreasing dose) Proceed with CT head EEG once COVID-19 ruled out Formal echocardiogram once COVID-19 ruled out. Decrease IV lidocaine to 4 mg/h and discontinue at 24-hour mark. Continue IV amiodarone for now. Consider pulmonary CT angios to rule out pulmonary embolism once TTM discontinued.  Will treat presumptively. Replete potassium and magnesium  Best practice:  Diet: Initiate trickle feeds Pain/Anxiety/Delirium protocol (if indicated): Sedation as per TTM protocol VAP protocol (if indicated): Bundle in place DVT prophylaxis: Therapeutic heparin IV GI prophylaxis: Pantoprazole Urinary catheter: Guide hemodynamic management Glucose control: Phase 1 basal bolus insulin Mobility: Bedrest Code Status: Full code Family Communication: Spoke to patient's wife Meriam Sprague who is a Lexicographer.  She denies any credible COVID-19 exposure Disposition: ICU  Critical care time: 50 min including chart data review, examination of patient, multidisciplinary rounds, and frequent assessment and modification of ventilator settings, sedation and TTM vasopressors and antiarrhythmics.   Lynnell Catalan, MD [facility] ICU Physician [facility] [place] [facility]  Pager: [phone] Mobile: [phone] After hours: [phone].  06/10/2018, 4:57 PM

## 2018-06-10 NOTE — Progress Notes (Signed)
ANTICOAGULATION CONSULT NOTE - Initial Consult  Pharmacy Consult for Heparin Indication: atrial fibrillation  Allergies not on file  Patient Measurements: Height: 5\' 6"  (167.6 cm) Weight: 200 lb (90.7 kg) IBW/kg (Calculated) : 63.8 Heparin Dosing Weight:   Vital Signs: Temp: 89.8 F (32.1 C) (04/11 0000) Temp Source: Temporal (04/10 2047) BP: 143/72 (04/10 2300) Pulse Rate: 74 (04/10 2300)  Labs: Recent Labs    06/13/2018 2054 06/08/2018 2159  HGB 14.0 9.2*  HCT 44.9 27.0*  PLT 144*  --   LABPROT 31.9*  --   INR 3.2*  --   CREATININE 1.67*  --   TROPONINI 0.04*  --     Estimated Creatinine Clearance: 37.8 mL/min (A) (by C-G formula based on SCr of 1.67 mg/dL (H)).   Medical History: Past Medical History:  Diagnosis Date  . Atrial fibrillation ([name])   . Chronic back pain   . CKD (chronic kidney disease), stage II   . Hypertension   . Nephrolithiasis   . Stroke [facility])    right cerebellar infarct  . TIA (transient ischemic attack)     Medications:  Cardizem  Flomax  Coumadin 5 mg daily except 7.5 mg Fridays  Assessment: 78 y.o. male admitted s/p cardiac arrest, now with ROSC and placed on hypothermia protocol, h/o Afib and Coumadin on hold, for heparin.  Per most recent Coumadin Clinic note, INR goal was 2.5-3.5  Goal of Therapy:  Heparin level 0.3-0.7 units/ml Monitor platelets by anticoagulation protocol: Yes   Plan:  Hold heparin for now and start once INR < 2.5 Daily INR  Jordanny Waddington, Gary Fleet 06/10/2018,12:33 AM

## 2018-06-10 NOTE — Progress Notes (Signed)
Initial Nutrition Assessment  DOCUMENTATION CODES:   Obesity unspecified  INTERVENTION:   Once access is obtained: -Osmolite 1.5 @ 20 ml/hr via OGT (480 ml) -60 ml Prostat QID -MVI daily  Provides: 1520 kcals, 150 grams protein, 366 ml free water. Meets 118% of calorie needs and 100% of protein needs. Unable to meet protein needs without slightly going over caloric requirement.   Once pt is ruled out for COVID-19, will discus possible Cortrak placement if needed.   NUTRITION DIAGNOSIS:   Inadequate oral intake related to inability to eat as evidenced by NPO status.  GOAL:   Patient will meet greater than or equal to 90% of their needs  MONITOR:   Diet advancement, Vent status, Labs, Weight trends, I & O's, Skin, TF tolerance  REASON FOR ASSESSMENT:   Ventilator    ASSESSMENT:   Patient without significant PMH documented. Presents this admission after Vfib arrest in field requiring defibrillation.    Pt currently on TTM. Awaiting rule out of COVID-19 as pt reports being in contact with known positive case. Per RN there has been multiple NGT/OGT attempts by nursing that were unsuccessful. Recommend diagnostic radiology placement if unable to get access in 24-48 hrs. RD to leave recommendations for when access is obtained.   Limited weight history in the chart. Will utilize EDW of 91.8 kg to estimated needs.   Unable to complete Nutrition-Focused physical exam at this time.   Patient is currently intubated on ventilator support MV: 14.9 L/min Temp (24hrs), Avg:91.6 F (33.1 C), Min:89.8 F (32.1 C), Max:95.5 F (35.3 C)  I/O: +3474 ml since admit UOP: 1075 ml x 24 hrs   Drips: amiodarone, Nimbex, Fentanyl, Insulin, levophed-weaning, sodium bicarb Labs reviewed: K 3.2 (L) ionized calcium 1.10 (L)   Diet Order:   Diet Order    None      EDUCATION NEEDS:   Not appropriate for education at this time  Skin:  Skin Assessment: Reviewed RN Assessment  Last  BM:  PTA  Height:   Ht Readings from Last 1 Encounters:  2018/06/14 5\' 6"  (1.676 m)    Weight:   Wt Readings from Last 1 Encounters:  06/10/18 91.8 kg    Ideal Body Weight:  64.5 kg  BMI:  Body mass index is 32.67 kg/m.  Estimated Nutritional Needs:   Kcal:  1010-1285 kcal  Protein:  130-160 grams  Fluid:  >/= 1.3 L/day   Vanessa Kick RD, LDN Clinical Nutrition Pager # [phone]

## 2018-06-10 NOTE — Progress Notes (Signed)
Spoke with Judeth Cornfield, RN. Pt has an order for an EEG (part of the order set for hypothermia) EEGs are only being done on PUI patients if there is a high suspension of status epilepticus. She will inform the patients physician.

## 2018-06-10 NOTE — Progress Notes (Signed)
RT NOTE:  Pt transported to 2H23 without event.

## 2018-06-10 NOTE — Progress Notes (Signed)
Pt SATS 88-89 % .recrutment done PC 30, PEEP 10 RR10 100% FIO2. SAT increased to 99%.

## 2018-06-10 NOTE — Progress Notes (Signed)
eLink Physician-Brief Progress Note Patient Name: Ross Lewis DOB: 03/15/1940 MRN: [id number]   Date of Service  06/10/2018  HPI/Events of Note  Patient with primary metabolic acidosis with mild resp acidosis with pH 7.1/41.1129/14  eICU Interventions  Plan: Increase vent rate to 30 3 amps of bicarb IVP Recheck ABG in 2 hours May require bicarb gtt     Intervention Category Major Interventions: Acid-Base disturbance - evaluation and management  [name] 06/10/2018, 6:21 AM

## 2018-06-10 NOTE — Progress Notes (Signed)
Echocardiogram cancelled covid test pending per Dr. Jacques Navy. Her recommendation is PCCM bedside echo for LV function.

## 2018-06-10 NOTE — Progress Notes (Signed)
CRITICAL VALUE ALERT  Critical Value:  INR 5.6  Date & Time Notied:  06/10/2018 1750  Provider Notified:06/09/1749  Orders Received/Actions taken: orders

## 2018-06-10 NOTE — Progress Notes (Signed)
CRITICAL VALUE ALERT  Critical Value:  Lactic acid 4.5 ; troponin 0.96; ABG results given  Date & Time Notied:  06/10/2018 0400  Provider Notified: Dr Deterding  Orders Received/Actions taken: None

## 2018-06-10 NOTE — Procedures (Signed)
Central Venous Catheter Insertion Procedure Note Ross Lewis [id number] 04/06/1940  Procedure: Insertion of Central Venous Catheter Indications: Drug and/or fluid administration  Procedure Details Consent: Risks of procedure as well as the alternatives and risks of each were explained to the (patient/caregiver).  Consent for procedure obtained. Time Out: Verified patient identification, verified procedure, site/side was marked, verified correct patient position, special equipment/implants available, medications/allergies/relevent history reviewed, required imaging and test results available.  Performed  Maximum sterile technique was used including antiseptics, cap, gloves, gown, hand hygiene, mask and sheet. Skin prep: Chlorhexidine; local anesthetic administered A antimicrobial bonded/coated triple lumen catheter was placed in the right internal jugular vein using the Seldinger technique.  Evaluation Blood flow good Complications: No apparent complications Patient did tolerate procedure well. Chest X-ray ordered to verify placement.  CXR: pending.  Ross Lewis Ross Lewis Ross Lewis 06/10/2018, 12:28 AM

## 2018-06-11 ENCOUNTER — Inpatient Hospital Stay (HOSPITAL_COMMUNITY): Payer: Medicare Other

## 2018-06-11 LAB — POCT I-STAT 7, (LYTES, BLD GAS, ICA,H+H)
Acid-base deficit: 4 mmol/L — ABNORMAL HIGH (ref 0.0–2.0)
Acid-base deficit: 5 mmol/L — ABNORMAL HIGH (ref 0.0–2.0)
Acid-base deficit: 5 mmol/L — ABNORMAL HIGH (ref 0.0–2.0)
Bicarbonate: 17.7 mmol/L — ABNORMAL LOW (ref 20.0–28.0)
Bicarbonate: 21.2 mmol/L (ref 20.0–28.0)
Bicarbonate: 22.3 mmol/L (ref 20.0–28.0)
Calcium, Ion: 1.11 mmol/L — ABNORMAL LOW (ref 1.15–1.40)
Calcium, Ion: 1.17 mmol/L (ref 1.15–1.40)
Calcium, Ion: 1.15 mmol/L (ref 1.15–1.40)
HCT: 42 % (ref 39.0–52.0)
HCT: 43 % (ref 39.0–52.0)
HCT: 43 % (ref 39.0–52.0)
Hemoglobin: 14.3 g/dL (ref 13.0–17.0)
Hemoglobin: 14.6 g/dL (ref 13.0–17.0)
Hemoglobin: 14.6 g/dL (ref 13.0–17.0)
O2 Saturation: 98 %
O2 Saturation: 93 %
O2 Saturation: 93 %
Patient temperature: 33.5
Patient temperature: 34.6
Potassium: 4.1 mmol/L (ref 3.5–5.1)
Potassium: 3.9 mmol/L (ref 3.5–5.1)
Potassium: 4.1 mmol/L (ref 3.5–5.1)
Sodium: 138 mmol/L (ref 135–145)
Sodium: 141 mmol/L (ref 135–145)
Sodium: 141 mmol/L (ref 135–145)
TCO2: 18 mmol/L — ABNORMAL LOW (ref 22–32)
TCO2: 22 mmol/L (ref 22–32)
TCO2: 24 mmol/L (ref 22–32)
pCO2 arterial: 21.2 mmHg — ABNORMAL LOW (ref 32.0–48.0)
pCO2 arterial: 37.2 mmHg (ref 32.0–48.0)
pCO2 arterial: 48.4 mmHg — ABNORMAL HIGH (ref 32.0–48.0)
pH, Arterial: 7.516 — ABNORMAL HIGH (ref 7.350–7.450)
pH, Arterial: 7.352 (ref 7.350–7.450)
pH, Arterial: 7.271 — ABNORMAL LOW (ref 7.350–7.450)
pO2, Arterial: 71 mmHg — ABNORMAL LOW (ref 83.0–108.0)
pO2, Arterial: 60 mmHg — ABNORMAL LOW (ref 83.0–108.0)
pO2, Arterial: 77 mmHg — ABNORMAL LOW (ref 83.0–108.0)

## 2018-06-11 LAB — CBC
HCT: 42.5 % (ref 39.0–52.0)
Hemoglobin: 14.7 g/dL (ref 13.0–17.0)
MCH: 32.8 pg (ref 26.0–34.0)
MCHC: 34.6 g/dL (ref 30.0–36.0)
MCV: 94.9 fL (ref 80.0–100.0)
Platelets: 137 10*3/uL — ABNORMAL LOW (ref 150–400)
RBC: 4.48 MIL/uL (ref 4.22–5.81)
RDW: 13.5 % (ref 11.5–15.5)
WBC: 6.6 10*3/uL (ref 4.0–10.5)
nRBC: 0 % (ref 0.0–0.2)

## 2018-06-11 LAB — POCT I-STAT 4, (NA,K, GLUC, HGB,HCT)
Glucose, Bld: 96 mg/dL (ref 70–99)
Glucose, Bld: 102 mg/dL — ABNORMAL HIGH (ref 70–99)
HCT: 42 % (ref 39.0–52.0)
HCT: 42 % (ref 39.0–52.0)
Hemoglobin: 14.3 g/dL (ref 13.0–17.0)
Hemoglobin: 14.3 g/dL (ref 13.0–17.0)
Potassium: 4 mmol/L (ref 3.5–5.1)
Potassium: 4 mmol/L (ref 3.5–5.1)
Sodium: 141 mmol/L (ref 135–145)
Sodium: 141 mmol/L (ref 135–145)

## 2018-06-11 LAB — BASIC METABOLIC PANEL
Anion gap: 10 (ref 5–15)
BUN: 19 mg/dL (ref 8–23)
CO2: 19 mmol/L — ABNORMAL LOW (ref 22–32)
Calcium: 8 mg/dL — ABNORMAL LOW (ref 8.9–10.3)
Chloride: 109 mmol/L (ref 98–111)
Creatinine, Ser: 1.25 mg/dL — ABNORMAL HIGH (ref 0.61–1.24)
GFR calc Af Amer: >60 mL/min (ref >60)
GFR calc non Af Amer: 54 mL/min — ABNORMAL LOW (ref >60)
Glucose, Bld: 140 mg/dL — ABNORMAL HIGH (ref 70–99)
Potassium: 3.9 mmol/L (ref 3.5–5.1)
Sodium: 138 mmol/L (ref 135–145)

## 2018-06-11 LAB — PHOSPHORUS: Phosphorus: 3.8 mg/dL (ref 2.5–4.6)

## 2018-06-11 LAB — PROTIME-INR
INR: 9.4 (ref 0.8–1.2)
INR: 4.4 (ref 0.8–1.2)
Prothrombin Time: 74.2 seconds — ABNORMAL HIGH (ref 11.4–15.2)
Prothrombin Time: 41.3 seconds — ABNORMAL HIGH (ref 11.4–15.2)

## 2018-06-11 LAB — GLUCOSE, CAPILLARY
Glucose-Capillary: 126 mg/dL — ABNORMAL HIGH (ref 70–99)
Glucose-Capillary: 94 mg/dL (ref 70–99)
Glucose-Capillary: 94 mg/dL (ref 70–99)
Glucose-Capillary: 106 mg/dL — ABNORMAL HIGH (ref 70–99)
Glucose-Capillary: 100 mg/dL — ABNORMAL HIGH (ref 70–99)

## 2018-06-11 LAB — MAGNESIUM: Magnesium: 1.7 mg/dL (ref 1.7–2.4)

## 2018-06-11 MED ORDER — ALBUTEROL SULFATE (2.5 MG/3ML) 0.083% IN NEBU
2.5000 mg | INHALATION_SOLUTION | RESPIRATORY_TRACT | Status: DC
  Filled 2018-06-11: qty 3

## 2018-06-11 MED ORDER — VITAL HIGH PROTEIN PO LIQD
1000.0000 mL | ORAL | Status: DC
  Administered 2018-06-12: 01:00:00 1000 mL

## 2018-06-11 MED ORDER — ALBUTEROL SULFATE (2.5 MG/3ML) 0.083% IN NEBU
2.5000 mg | INHALATION_SOLUTION | RESPIRATORY_TRACT | Status: DC | PRN
  Administered 2018-06-11: 14:00:00 2.5 mg via RESPIRATORY_TRACT

## 2018-06-11 MED ORDER — ALBUTEROL SULFATE HFA 108 (90 BASE) MCG/ACT IN AERS
6.0000 | INHALATION_SPRAY | RESPIRATORY_TRACT | Status: DC | PRN
  Filled 2018-06-11: qty 6.7

## 2018-06-11 MED ORDER — VITAMIN K1 10 MG/ML IJ SOLN
5.0000 mg | Freq: Once | INTRAVENOUS | Status: AC
  Administered 2018-06-11: 5 mg via INTRAVENOUS
  Filled 2018-06-11: qty 0.5

## 2018-06-11 MED ORDER — LIDOCAINE 5 % EX PTCH
2.0000 | MEDICATED_PATCH | CUTANEOUS | Status: DC
  Administered 2018-06-11 – 2018-06-17 (×7): 2 via TRANSDERMAL
  Filled 2018-06-11 (×7): qty 2

## 2018-06-11 MED ORDER — VITAMIN K1 1 MG/0.5ML IJ SOLN: 5.0000 mg | Freq: Once | INTRAMUSCULAR | Status: DC

## 2018-06-11 MED ORDER — FUROSEMIDE 10 MG/ML IJ SOLN
80.0000 mg | Freq: Once | INTRAMUSCULAR | Status: AC
  Administered 2018-06-11: 80 mg via INTRAVENOUS
  Filled 2018-06-11: qty 8

## 2018-06-11 MED ORDER — FUROSEMIDE 10 MG/ML IJ SOLN
40.0000 mg | Freq: Once | INTRAMUSCULAR | Status: AC
  Administered 2018-06-11: 40 mg via INTRAVENOUS
  Filled 2018-06-11: qty 4

## 2018-06-11 MED ORDER — PRO-STAT SUGAR FREE PO LIQD
30.0000 mL | Freq: Two times a day (BID) | ORAL | Status: DC
  Administered 2018-06-11: 30 mL
  Filled 2018-06-11 (×2): qty 30

## 2018-06-11 NOTE — Progress Notes (Addendum)
Pt on 33 C TTM. Rewarming initiated at 2320 by 0.25 C/hr until 37 *C reached. Witnessed by [employer].

## 2018-06-11 NOTE — Progress Notes (Signed)
NAME:  Ross Lewis, MRN:  [id number], DOB:  29-Apr-1940 Jun 10, 2018, LOS: 2 ADMISSION DATE:  2018-06-10, CONSULTATION DATE: 4/10/ 2020 REFERRING MD: Vallery Ridge, CHIEF COMPLAINT: Status post cardiac arrest  HPI/course in hospital  78 year old man with limited past medical history. Suffered a witnessed cardiac arrest at home.  CPR initiated after 3 minutes.  Defibrillated from ventricular fibrillation.  Spontaneous circulation restored after 15 minutes. Placed in isolation for reported COVID-19 contact. Started on TTM.  +++ Name is incorrect: Correct name is Ross Lewis 05-05-1940 +++  Past Medical History    . Atrial fibrillation ([name]) followed by Dr. Rennis Golden on warfarin.   . Chronic back pain   . CKD (chronic kidney disease), stage II   . Hypertension   . Nephrolithiasis   . Stroke [facility]) followed by Dr. Pearlean Brownie     right cerebellar infarct  . TIA (transient ischemic attack) last in March    Interim history/subjective:  TTM discontinued. Increased ventilator asynchrony and breath stacking.   Objective   Blood pressure (!) 117/54, pulse 82, temperature 98.8 F (37.1 C), resp. rate 20, height  (1.676 m), weight 93.1 kg, SpO2 95 %. CVP:  [7 mmHg-11 mmHg] 7 mmHg  Vent Mode: PRVC FiO2 (%):  [100 %] 100 % Set Rate:  [20 bmp-30 bmp] 20 bmp Vt Set:  [510 mL] 510 mL PEEP:  [5 cmH20-10 cmH20] 8 cmH20   Intake/Output Summary (Last 24 hours) at 06/11/2018 1606 Last data filed at 06/11/2018 1500 Gross per 24 hour  Intake 2939.62 ml  Output 555 ml  Net 2384.62 ml   Filed Weights   2018-06-10 2205 06/10/18 0430 06/11/18 0300  Weight: 90.7 kg 91.8 kg 93.1 kg    Examination: Physical Exam Constitutional:      Comments: Sedated and chemically paralyzed  HENT:     Mouth/Throat:     Comments: ET tube OG tube in place Eyes:     Pupils: Pupils are equal, round, and reactive to light.  Cardiovascular:     Rate and Rhythm: Regular rhythm. Bradycardia present.   Heart sounds: Normal heart sounds.  Pulmonary:     Breath sounds: Wheezing and rhonchi present. No rales.     Comments: Marked asynchrony Abdominal:     General: Abdomen is flat.     Palpations: Abdomen is soft.  Skin:    General: Skin is warm.     Capillary Refill: Capillary refill takes 2 to 3 seconds.  Neurological:     Comments: Rewarmed and off paralytics. No response to verbal stimulation   I performed a bedside point-of-care echocardiogram 4/11 which showed normal LV size with hyperdynamic function.  RV dilatation with moderate dysfunction.  No significant valvular abnormalities noted.  No pericardial effusion  Ancillary tests (personally reviewed)  CBC: Recent Labs  Lab 06/10/2018 2054  2018/06/10 2338  06/10/18 0525  06/10/18 0911  06/10/18 1800  06/11/18 0405 06/11/18 0631 06/11/18 0827 06/11/18 1024 06/11/18 1214  WBC 11.9*  --  10.0  --  15.4*  --  12.2*  --  8.9  --  6.6  --   --   --   --   NEUTROABS 6.8  --   --   --   --   --   --   --   --   --   --   --   --   --   --   HGB 14.0   < > 14.9   < >  14.8   < > 14.3   < > 14.3   < > 14.7 14.6 14.3 14.6 14.3  HCT 44.9   < > 46.1   < > 47.1   < > 44.8   < > 43.5   < > 42.5 43.0 42.0 43.0 42.0  MCV 102.5*  --  102.4*  --  103.7*  --  101.4*  --  96.7  --  94.9  --   --   --   --   PLT 144*  --  132*  --  181  --  PLATELET CLUMPS NOTED ON SMEAR, COUNT APPEARS DECREASED  --  120*  --  137*  --   --   --   --    < > = values in this interval not displayed.    Basic Metabolic Panel: Recent Labs  Lab 06/23/2018 2054  06/01/2018 2338  06/10/18 0525  06/10/18 1409 06/10/18 1637  06/11/18 0405 06/11/18 0631 06/11/18 0827 06/11/18 1024 06/11/18 1214  NA 142   < > 139   < > 140   < >  --  143   < > 138 141 141 141 141  K 3.8   < > 3.5   < > 3.2*   < >  --  2.6*   < > 3.9 3.9 4.0 4.1 4.0  CL 114*  --  111  --  110  --   --   --   --  109  --   --   --   --   CO2 18*  --  16*  --  15*  --   --   --   --  19*  --   --   --    --   GLUCOSE 203*  --  309*  --  278*  --   --  129*  --  140*  --  96  --  102*  BUN 14  --  15  --  15  --   --   --   --  19  --   --   --   --   CREATININE 1.67*  --  1.31*  --  1.57*  --   --   --   --  1.25*  --   --   --   --   CALCIUM 8.3*  --  8.0*  --  8.1*  --   --   --   --  8.0*  --   --   --   --   MG  --   --   --   --   --   --  1.7  --   --   --   --   --   --   --    < > = values in this interval not displayed.   GFR: Estimated Creatinine Clearance: 51.2 mL/min (A) (by C-G formula based on SCr of 1.25 mg/dL (H)). Recent Labs  Lab 06/10/18 0208 06/10/18 0525 06/10/18 0911 06/10/18 1800 06/11/18 0405  WBC  --  15.4* 12.2* 8.9 6.6  LATICACIDVEN 4.5* 7.8*  --   --   --     Liver Function Tests: Recent Labs  Lab 06/16/2018 2054 06/10/18 1800  AST 162* 88*  ALT 126* 104*  ALKPHOS 62 32*  BILITOT 0.9 0.7  PROT 5.3* 4.8*  ALBUMIN 3.0* 2.5*   Recent Labs  Lab 06/21/2018 2054  LIPASE 40   No results for input(s): AMMONIA in the last 168 hours.  ABG    Component Value Date/Time   PHART 7.271 (L) 06/11/2018 1024   PCO2ART 48.4 (H) 06/11/2018 1024   PO2ART 77.0 (L) 06/11/2018 1024   HCO3 22.3 06/11/2018 1024   TCO2 24 06/11/2018 1024   ACIDBASEDEF 5.0 (H) 06/11/2018 1024   O2SAT 93.0 06/11/2018 1024     Coagulation Profile: Recent Labs  Lab 07/08/18 2054 2018/07/08 2338 06/10/18 0525 06/10/18 1700 06/11/18 0405  INR 3.2* 3.4* 3.6* 5.6* 9.4*    Cardiac Enzymes: Recent Labs  Lab 06/10/18 0208 06/10/18 0525 06/10/18 0911 06/10/18 1409 06/10/18 1800  TROPONINI 0.73* 0.96* 1.39* 1.49* 1.55*    HbA1C: No results found for: HGBA1C  CBG: Recent Labs  Lab 06/10/18 2301 06/11/18 0408 06/11/18 0824 06/11/18 1212 06/11/18 1552  GLUCAP 130* 126* 94 94 106*   CXR 4/12: personally reviewed - new R lower lung consolidation   Assessment & Plan:  Critically ill due to acute respiratory failure require mechanical ventilation  Ventilator  asynchrony, worsening hypoxia with consolidation due to aspiration or contusion from CPR. Reactive airways from fluid overload. Airway compromise secondary to post arrest ischemic hypoxic encephalopathy Ischemic hypoxic encephalopathy on therapeutic temperature management Critically ill due to vasopressor dependent shock requiring titration of norepinephrine. RV dysfunction present on echocardiogram consistent with possible pulmonary embolism Status post ventricular fibrillation cardiac arrest - cause still unclear. Atrial fibrillation on warfarin Low  probability COVID rule out Hypokalemia may have contributed to arrhythmia Warfarin induced coagulopathy.   PLAN:  Continue full ventilatory support Increase PEEP Resume NMB Maintain current sedation on NMB Bronchodilators Hold on antibiotics given normal WBC. Hold Heparin for PE Vitamin K for elevated INR Titrate norepinephrine to keep MAP>65 Proceed with CT head EEG once COVID-19 ruled out Formal echocardiogram once COVID-19 ruled out. Continue IV amiodarone for now. Consider pulmonary CT angios to rule out pulmonary embolism   Best practice:  Diet: No enteral access.  Pain/Anxiety/Delirium protocol (if indicated): Sedation while on NMB VAP protocol (if indicated): Bundle in place DVT prophylaxis: Therapeutic heparin IV GI prophylaxis: Pantoprazole Urinary catheter: Guide hemodynamic management Glucose control: Phase 1 basal bolus insulin Mobility: Bedrest Code Status: Full code Family Communication: Spoke to patient's wife Meriam Sprague who is a Lexicographer.  She denies any credible COVID-19 exposure. Disposition: ICU  Critical care time: 50 min including chart data review, examination of patient, multidisciplinary rounds, and frequent assessment and modification of ventilator settings, sedation and TTM vasopressors and antiarrhythmics.   Lynnell Catalan, MD [facility] ICU Physician [facility]  Critical Care  Pager:  [phone] Mobile: [phone] After hours: [phone].  06/11/2018, 4:06 PM

## 2018-06-11 NOTE — Progress Notes (Signed)
CRITICAL VALUE ALERT  Critical Value:  INR 9.4  Date & Time Notied:  06/11/2018 0515  Provider Notified: Dr Deterding  Orders Received/Actions taken: no new orders received

## 2018-06-11 NOTE — Progress Notes (Signed)
RT NOTES: Patient found on PS/CPAP 70% 10/8. MD changed vent settings per RN. Sats 90% and patient is visibly labored and diaphoretic. Patient placed back on full support at this time with fio2 100%. Sats now 95% but patient still labored. MD paged by RN with order to give albuterol neb and MD would come check on him. Neb given at this time.

## 2018-06-11 NOTE — Progress Notes (Signed)
ANTICOAGULATION CONSULT NOTE   Pharmacy Consult for Heparin Indication: atrial fibrillation, possible PE  Not on File  Patient Measurements: Height: 5\' 6"  (167.6 cm) Weight: 205 lb 4.3 oz (93.1 kg) IBW/kg (Calculated) : 63.8 Heparin Dosing Weight:   Vital Signs: Temp: 94.5 F (34.7 C) (04/12 0700) Temp Source: Core (04/12 0400) BP: 127/65 (04/12 0700) Pulse Rate: 77 (04/12 0700)  Labs: Recent Labs    Jul 09, 2018 2338  06/10/18 0525  06/10/18 0911 06/10/18 1409  06/10/18 1700 06/10/18 1800  06/11/18 0159 06/11/18 0405 06/11/18 0631  HGB 14.9   < > 14.8   < > 14.3  --    < >  --  14.3   < > 14.3 14.7 14.6  HCT 46.1   < > 47.1   < > 44.8  --    < >  --  43.5   < > 42.0 42.5 43.0  PLT 132*  --  181  --  PLATELET CLUMPS NOTED ON SMEAR, COUNT APPEARS DECREASED  --   --   --  120*  --   --  137*  --   APTT 51*  --  41*  --   --   --   --   --   --   --   --   --   --   LABPROT 33.8*  --  35.0*  --   --   --   --  49.9*  --   --   --  74.2*  --   INR 3.4*  --  3.6*  --   --   --   --  5.6*  --   --   --  9.4*  --   CREATININE 1.31*  --  1.57*  --   --   --   --   --   --   --   --  1.25*  --   TROPONINI 0.44*   < > 0.96*  --  1.39* 1.49*  --   --  1.55*  --   --   --   --    < > = values in this interval not displayed.    Estimated Creatinine Clearance: 51.2 mL/min (A) (by C-G formula based on SCr of 1.25 mg/dL (H)).   Medical History: Past Medical History:  Diagnosis Date  . Atrial fibrillation ([name])   . Chronic back pain   . CKD (chronic kidney disease), stage II   . Hypertension   . Nephrolithiasis   . Stroke [facility])    right cerebellar infarct  . TIA (transient ischemic attack)     Medications:  Cardizem  Flomax  Coumadin 5 mg daily except 7.5 mg Fridays  Assessment: 78 y.o. male admitted s/p cardiac arrest, now with ROSC and placed on hypothermia protocol, h/o Afib and Coumadin on hold, for heparin.  Per most recent Coumadin Clinic note, INR goal was  2.5-3.5.  INR still well above therapeutic range and increased to >9 on am labs. Will hold off on heparin for now  Goal of Therapy:  Heparin level 0.3-0.7 units/ml Monitor platelets by anticoagulation protocol: Yes   Plan:  Hold heparin for now and start once INR < 2.5 Daily INR  Sheppard Coil PharmD., BCPS Clinical Pharmacist 06/11/2018 7:40 AM

## 2018-06-12 ENCOUNTER — Telehealth: Payer: Self-pay | Admitting: Internal Medicine

## 2018-06-12 ENCOUNTER — Inpatient Hospital Stay (HOSPITAL_COMMUNITY): Payer: Medicare Other

## 2018-06-12 LAB — PROTIME-INR
INR: 3.7 — ABNORMAL HIGH (ref 0.8–1.2)
INR: 2.4 — ABNORMAL HIGH (ref 0.8–1.2)
Prothrombin Time: 36 seconds — ABNORMAL HIGH (ref 11.4–15.2)
Prothrombin Time: 25.5 seconds — ABNORMAL HIGH (ref 11.4–15.2)

## 2018-06-12 LAB — POCT I-STAT 7, (LYTES, BLD GAS, ICA,H+H)
Acid-base deficit: 6 mmol/L — ABNORMAL HIGH (ref 0.0–2.0)
Acid-base deficit: 5 mmol/L — ABNORMAL HIGH (ref 0.0–2.0)
Acid-base deficit: 4 mmol/L — ABNORMAL HIGH (ref 0.0–2.0)
Bicarbonate: 21.5 mmol/L (ref 20.0–28.0)
Bicarbonate: 21.2 mmol/L (ref 20.0–28.0)
Bicarbonate: 23.9 mmol/L (ref 20.0–28.0)
Calcium, Ion: 1.14 mmol/L — ABNORMAL LOW (ref 1.15–1.40)
Calcium, Ion: 1.04 mmol/L — ABNORMAL LOW (ref 1.15–1.40)
Calcium, Ion: 1.21 mmol/L (ref 1.15–1.40)
HCT: 42 % (ref 39.0–52.0)
HCT: 39 % (ref 39.0–52.0)
HCT: 41 % (ref 39.0–52.0)
Hemoglobin: 14.3 g/dL (ref 13.0–17.0)
Hemoglobin: 13.3 g/dL (ref 13.0–17.0)
Hemoglobin: 13.9 g/dL (ref 13.0–17.0)
O2 Saturation: 93 %
O2 Saturation: 95 %
O2 Saturation: 82 %
Patient temperature: 36.8
Potassium: 4.2 mmol/L (ref 3.5–5.1)
Potassium: 4.7 mmol/L (ref 3.5–5.1)
Potassium: 4.4 mmol/L (ref 3.5–5.1)
Sodium: 141 mmol/L (ref 135–145)
Sodium: 142 mmol/L (ref 135–145)
Sodium: 141 mmol/L (ref 135–145)
TCO2: 23 mmol/L (ref 22–32)
TCO2: 23 mmol/L (ref 22–32)
TCO2: 25 mmol/L (ref 22–32)
pCO2 arterial: 49.8 mmHg — ABNORMAL HIGH (ref 32.0–48.0)
pCO2 arterial: 45 mmHg (ref 32.0–48.0)
pCO2 arterial: 50.9 mmHg — ABNORMAL HIGH (ref 32.0–48.0)
pH, Arterial: 7.243 — ABNORMAL LOW (ref 7.350–7.450)
pH, Arterial: 7.282 — ABNORMAL LOW (ref 7.350–7.450)
pH, Arterial: 7.278 — ABNORMAL LOW (ref 7.350–7.450)
pO2, Arterial: 81 mmHg — ABNORMAL LOW (ref 83.0–108.0)
pO2, Arterial: 85 mmHg (ref 83.0–108.0)
pO2, Arterial: 53 mmHg — ABNORMAL LOW (ref 83.0–108.0)

## 2018-06-12 LAB — GLUCOSE, CAPILLARY
Glucose-Capillary: 38 mg/dL — CL (ref 70–99)
Glucose-Capillary: 170 mg/dL — ABNORMAL HIGH (ref 70–99)
Glucose-Capillary: 110 mg/dL — ABNORMAL HIGH (ref 70–99)
Glucose-Capillary: 82 mg/dL (ref 70–99)
Glucose-Capillary: 110 mg/dL — ABNORMAL HIGH (ref 70–99)
Glucose-Capillary: 115 mg/dL — ABNORMAL HIGH (ref 70–99)
Glucose-Capillary: 113 mg/dL — ABNORMAL HIGH (ref 70–99)

## 2018-06-12 LAB — MAGNESIUM
Magnesium: 1.7 mg/dL (ref 1.7–2.4)
Magnesium: 1.8 mg/dL (ref 1.7–2.4)

## 2018-06-12 LAB — PHOSPHORUS
Phosphorus: 4 mg/dL (ref 2.5–4.6)
Phosphorus: 3 mg/dL (ref 2.5–4.6)

## 2018-06-12 MED ORDER — DEXTROSE 50 % IV SOLN: 1.0000 | Freq: Once | INTRAVENOUS | Status: AC

## 2018-06-12 MED ORDER — QUETIAPINE FUMARATE 25 MG PO TABS
25.0000 mg | ORAL_TABLET | Freq: Two times a day (BID) | ORAL | Status: DC
  Administered 2018-06-12 – 2018-06-14 (×5): 25 mg
  Filled 2018-06-12 (×8): qty 1

## 2018-06-12 MED ORDER — HEPARIN (PORCINE) 25000 UT/250ML-% IV SOLN
2300.0000 [IU]/h | INTRAVENOUS | Status: DC
  Administered 2018-06-12: 900 [IU]/h via INTRAVENOUS
  Administered 2018-06-13: 1200 [IU]/h via INTRAVENOUS
  Administered 2018-06-14: 14:00:00 1500 [IU]/h via INTRAVENOUS
  Administered 2018-06-15: 2200 [IU]/h via INTRAVENOUS
  Filled 2018-06-12 (×7): qty 250

## 2018-06-12 MED ORDER — FUROSEMIDE 10 MG/ML IJ SOLN: 80.0000 mg | Freq: Three times a day (TID) | INTRAMUSCULAR | Status: DC

## 2018-06-12 MED ORDER — HYDROMORPHONE HCL 2 MG PO TABS
2.0000 mg | ORAL_TABLET | Freq: Four times a day (QID) | ORAL | Status: DC
  Administered 2018-06-12 – 2018-06-15 (×10): 2 mg
  Filled 2018-06-12 (×10): qty 1

## 2018-06-12 MED ORDER — HYDROMORPHONE HCL 2 MG PO TABS: 1.0000 mg | ORAL_TABLET | Freq: Four times a day (QID) | ORAL | Status: DC

## 2018-06-12 MED ORDER — AMIODARONE HCL 200 MG PO TABS
400.0000 mg | ORAL_TABLET | Freq: Two times a day (BID) | ORAL | Status: DC
  Administered 2018-06-12 – 2018-06-17 (×11): 400 mg
  Filled 2018-06-12 (×11): qty 2

## 2018-06-12 MED ORDER — PRO-STAT SUGAR FREE PO LIQD
60.0000 mL | Freq: Three times a day (TID) | ORAL | Status: DC
  Administered 2018-06-12 – 2018-06-17 (×17): 60 mL
  Filled 2018-06-12 (×16): qty 60

## 2018-06-12 MED ORDER — AMIODARONE HCL 200 MG PO TABS: 400.0000 mg | ORAL_TABLET | Freq: Every day | ORAL | Status: DC

## 2018-06-12 MED ORDER — HYDROMORPHONE HCL 1 MG/ML PO LIQD
1.0000 mg | Freq: Four times a day (QID) | ORAL | Status: DC
  Filled 2018-06-12: qty 1

## 2018-06-12 MED ORDER — DEXTROSE 50 % IV SOLN
INTRAVENOUS | Status: AC
  Administered 2018-06-12: 50 mL
  Filled 2018-06-12: qty 50

## 2018-06-12 MED ORDER — VITAL HIGH PROTEIN PO LIQD: 1000.0000 mL | ORAL | Status: AC

## 2018-06-12 MED ORDER — VITAL HIGH PROTEIN PO LIQD: 1000.0000 mL | ORAL | Status: DC

## 2018-06-12 MED ORDER — VITAL 1.5 CAL PO LIQD
1000.0000 mL | ORAL | Status: DC
  Administered 2018-06-12: 1000 mL
  Filled 2018-06-12: qty 1000

## 2018-06-12 MED ORDER — FUROSEMIDE 10 MG/ML IJ SOLN
80.0000 mg | Freq: Every day | INTRAMUSCULAR | Status: DC
  Administered 2018-06-12: 80 mg via INTRAVENOUS
  Filled 2018-06-12: qty 8

## 2018-06-12 MED ORDER — HYDROMORPHONE HCL 1 MG/ML PO LIQD
2.0000 mg | Freq: Four times a day (QID) | ORAL | Status: DC
  Filled 2018-06-12: qty 2

## 2018-06-12 NOTE — Progress Notes (Signed)
ANTICOAGULATION CONSULT NOTE   Pharmacy Consult for Heparin Indication: atrial fibrillation, possible PE  No Known Allergies  Patient Measurements: Height: 5\' 6"  (167.6 cm) Weight: 204 lb 5.9 oz (92.7 kg) IBW/kg (Calculated) : 63.8 Heparin Dosing Weight:   Vital Signs: Temp: 98.2 F (36.8 C) (04/13 2000) Temp Source: Bladder (04/13 2000) BP: 112/61 (04/13 2000) Pulse Rate: 94 (04/13 2000)  Labs: Recent Labs    06/18/2018 2338  06/10/18 0525  06/10/18 0911 06/10/18 1409  06/10/18 1800  06/11/18 0405  06/11/18 1214 06/11/18 1552 06/11/18 1554 06/12/18 0203 06/12/18 0519 06/12/18 1700  HGB 14.9   < > 14.8   < > 14.3  --    < > 14.3   < > 14.7   < > 14.3  --  14.3  --  13.3  --   HCT 46.1   < > 47.1   < > 44.8  --    < > 43.5   < > 42.5   < > 42.0  --  42.0  --  39.0  --   PLT 132*  --  181  --  PLATELET CLUMPS NOTED ON SMEAR, COUNT APPEARS DECREASED  --   --  120*  --  137*  --   --   --   --   --   --   --   APTT 51*  --  41*  --   --   --   --   --   --   --   --   --   --   --   --   --   --   LABPROT 33.8*  --  35.0*  --   --   --    < >  --   --  74.2*  --   --  41.3*  --  36.0*  --  25.5*  INR 3.4*  --  3.6*  --   --   --    < >  --   --  9.4*  --   --  4.4*  --  3.7*  --  2.4*  CREATININE 1.31*  --  1.57*  --   --   --   --   --   --  1.25*  --   --   --   --   --   --   --   TROPONINI 0.44*   < > 0.96*  --  1.39* 1.49*  --  1.55*  --   --   --   --   --   --   --   --   --    < > = values in this interval not displayed.    Estimated Creatinine Clearance: 51.1 mL/min (A) (by C-G formula based on SCr of 1.25 mg/dL (H)).   Medical History: Past Medical History:  Diagnosis Date  . Atrial fibrillation ([name])   . Chronic back pain   . CKD (chronic kidney disease), stage II   . Hypertension   . Nephrolithiasis   . Stroke [facility])    right cerebellar infarct  . TIA (transient ischemic attack)     Medications:  Cardizem  Flomax  Coumadin 5 mg daily except  7.5 mg Fridays  Assessment: 78 y.o. male admitted s/p cardiac arrest, now with ROSC and placed on hypothermia protocol, h/o Afib and Coumadin on hold, for heparin.  Per most recent Coumadin Clinic note, INR goal was 2.5-3.5.  INR  increased to >9 and received iv Vit K > INR now 2.4  Will start heparin drip at low rate   Goal of Therapy:  INR 2.5-3.5 Heparin level 0.3-0.7 units/ml Monitor platelets by anticoagulation protocol: Yes   Plan:  Heparin drip 900 units/hr  Daily INR, HL, CBC  Leota SauersLisa Piera Downs Pharm.D. CPP, BCPS Clinical Pharmacist [phone] 06/12/2018 8:52 PM

## 2018-06-12 NOTE — Progress Notes (Signed)
Nutrition Follow-up  RD working remotely.   DOCUMENTATION CODES:   Obesity unspecified  INTERVENTION:   Tube Feeding:  Vital 1.5 at 35 ml/hr Pro-Stat 60 mL TID Provides 1860 kcals, 147 g of protein and 638 mL of free water Meeting 100% estimated calorie and protein needs  Once more stable, off paralytic/pressors, recommend switching formula to Jevity/Osmolite depending on tolerance   NUTRITION DIAGNOSIS:   Inadequate oral intake related to inability to eat as evidenced by NPO status.  Being addressed via TF   GOAL:   Patient will meet greater than or equal to 90% of their needs  Progressing  MONITOR:   Diet advancement, Vent status, Labs, Weight trends, I & O's, Skin, TF tolerance  REASON FOR ASSESSMENT:   Ventilator    ASSESSMENT:   78 yo male with hx of afib admitted post witness arrest, intubated in the field. Pt with another vfib arrest during transport to hospital. Pt started on TTM, possible COVID 19 (rule out)   4/10 Admit, intubated, TTM  COVID 19 results still pending  TTM discontinued, requiring paralytic for vent asynchrony Patient is currently intubated on ventilator support, fentanyl and versed drips for sedation; currently paralyzed with nimbex, levophed for pressor support MV: 10.4 L/min Temp (24hrs), Avg:98.4 F (36.9 C), Min:96.1 F (35.6 C), Max:99 F (37.2 C)  OG tube looped in stomach (abd xray 4/12) Adult Tube Feeding Protocol ordered by MD yesterday Spoke with RN who indicates Vital High Protein at 40 ml/hr Pt on paralytic; discussed with RN. It is safe to feed enterally while on paralytic as paralytics affect the skeletal muscle, not the smooth muscle of the GI tract  Noted episode of hypoglycemia around 0116. This is around the same time TF was initiated/hung. TF initiation should help with CBGs  Admission weight of 90.7 kg (200 pounds), unsure if measured or stated/estimated; current wt 92.7 kg. Net +7.5 L per I/O flow sheet.  Per RN, pt with some mild generalized edema. If I/O are accurate, pt dry wt around 85.2 kg putting BMI <30  Labs: CBGs 38-170, phosphorus wdl, magnesium wdl, potassum wdl Meds: ss novolog  Diet Order:   Diet Order    None      EDUCATION NEEDS:   Not appropriate for education at this time  Skin:  Skin Assessment: Reviewed RN Assessment  Last BM:  4/12  Height:   Ht Readings from Last 1 Encounters:  06/19/2018 5\' 6"  (1.676 m)    Weight:   Wt Readings from Last 1 Encounters:  06/12/18 92.7 kg    Ideal Body Weight:  64.5 kg  BMI:  Body mass index is 32.99 kg/m.  Estimated Nutritional Needs:   Kcal:  1830 kcals   Protein:  130-160 grams  Fluid:  >/= 1.8 L   Romelle Starcher MS, RD, LDN, CNSC [phone] Pager  [phone] Weekend/On-Call Pager

## 2018-06-12 NOTE — Progress Notes (Signed)
NAME:  Venita LickWilliams Martorana, MRN:  [id number], DOB:  Nov 19, 1940 Sep 04, 2018, LOS: 3 ADMISSION DATE:  Sep 04, 2018, CONSULTATION DATE: 4/10/ 2020 REFERRING MD: Vallery RidgeLong-ED , CHIEF COMPLAINT: Status post cardiac arrest  HPI/course in hospital  78 year old man with limited past medical history. Suffered a witnessed cardiac arrest at home.  CPR initiated after 3 minutes.  Defibrillated from ventricular fibrillation.  Spontaneous circulation restored after 15 minutes. Placed in isolation for reported COVID-19 contact. Started on TTM.  +++ Name is incorrect: Correct name is Lynann BolognaWilliam L Bevel Nov 19, 1940 +++  Past Medical History    . Atrial fibrillation ([name]) followed by Dr. Rennis GoldenHilty on warfarin.   . Chronic back pain   . CKD (chronic kidney disease), stage II   . Hypertension   . Nephrolithiasis   . Stroke [facility]([name]) followed by Dr. Pearlean BrownieSethi     right cerebellar infarct  . TIA (transient ischemic attack) last in March    Interim history/subjective:  TTM discontinued. Asynchrony improved with NMB  Objective   Blood pressure (!) 114/48, pulse 91, temperature 98.8 F (37.1 C), resp. rate 16, height 5\' 6"  (1.676 m), weight 92.7 kg, SpO2 92 %. CVP:  [0 mmHg-16 mmHg] 16 mmHg  Vent Mode: PRVC FiO2 (%):  [100 %] 100 % Set Rate:  [20 bmp] 20 bmp Vt Set:  [510 mL] 510 mL PEEP:  [8 cmH20] 8 cmH20 Plateau Pressure:  [20 cmH20] 20 cmH20   Intake/Output Summary (Last 24 hours) at 06/12/2018 1851 Last data filed at 06/12/2018 1800 Gross per 24 hour  Intake 2251.26 ml  Output 1720 ml  Net 531.26 ml   Filed Weights   06/10/18 0430 06/11/18 0300 06/12/18 0423  Weight: 91.8 kg 93.1 kg 92.7 kg    Examination: Physical Exam Constitutional:      Comments: Sedated and chemically paralyzed  HENT:     Mouth/Throat:     Comments: ET tube OG tube in place Eyes:     Pupils: Pupils are equal, round, and reactive to light.  Cardiovascular:     Rate and Rhythm: Regular rhythm. Bradycardia present.   Heart sounds: Normal heart sounds.  Pulmonary:     Breath sounds: No wheezing, rhonchi or rales.     Comments: Marked asynchrony Abdominal:     General: Abdomen is flat.     Palpations: Abdomen is soft.  Skin:    General: Skin is warm.     Capillary Refill: Capillary refill takes 2 to 3 seconds.  Neurological:     Comments: Rewarmed and off paralytics. No response to verbal stimulation   I performed a bedside point-of-care echocardiogram 4/11 which showed normal LV size with hyperdynamic function.  RV dilatation with moderate dysfunction.  No significant valvular abnormalities noted.  No pericardial effusion  Ancillary tests (personally reviewed)  CBC: Recent Labs  Lab 2018/12/29 2054  2018/12/29 2338  06/10/18 0525  06/10/18 0911  06/10/18 1800  06/11/18 0405  06/11/18 0827 06/11/18 1024 06/11/18 1214 06/11/18 1554 06/12/18 0519  WBC 11.9*  --  10.0  --  15.4*  --  12.2*  --  8.9  --  6.6  --   --   --   --   --   --   NEUTROABS 6.8  --   --   --   --   --   --   --   --   --   --   --   --   --   --   --   --  HGB 14.0   < > 14.9   < > 14.8   < > 14.3   < > 14.3   < > 14.7   < > 14.3 14.6 14.3 14.3 13.3  HCT 44.9   < > 46.1   < > 47.1   < > 44.8   < > 43.5   < > 42.5   < > 42.0 43.0 42.0 42.0 39.0  MCV 102.5*  --  102.4*  --  103.7*  --  101.4*  --  96.7  --  94.9  --   --   --   --   --   --   PLT 144*  --  132*  --  181  --  PLATELET CLUMPS NOTED ON SMEAR, COUNT APPEARS DECREASED  --  120*  --  137*  --   --   --   --   --   --    < > = values in this interval not displayed.    Basic Metabolic Panel: Recent Labs  Lab 06/14/2018 2054  06/08/2018 2338  06/10/18 0525  06/10/18 1409 06/10/18 1637  06/11/18 0405  06/11/18 0827 06/11/18 1024 06/11/18 1214 06/11/18 1554 06/11/18 2131 06/12/18 0203 06/12/18 0519 06/12/18 1700  NA 142   < > 139   < > 140   < >  --  143   < > 138   < > 141 141 141 141  --   --  142  --   K 3.8   < > 3.5   < > 3.2*   < >  --  2.6*   < > 3.9    < > 4.0 4.1 4.0 4.2  --   --  4.7  --   CL 114*  --  111  --  110  --   --   --   --  109  --   --   --   --   --   --   --   --   --   CO2 18*  --  16*  --  15*  --   --   --   --  19*  --   --   --   --   --   --   --   --   --   GLUCOSE 203*  --  309*  --  278*  --   --  129*  --  140*  --  96  --  102*  --   --   --   --   --   BUN 14  --  15  --  15  --   --   --   --  19  --   --   --   --   --   --   --   --   --   CREATININE 1.67*  --  1.31*  --  1.57*  --   --   --   --  1.25*  --   --   --   --   --   --   --   --   --   CALCIUM 8.3*  --  8.0*  --  8.1*  --   --   --   --  8.0*  --   --   --   --   --   --   --   --   --  MG  --   --   --   --   --   --  1.7  --   --   --   --   --   --   --   --  1.7 1.7  --  1.8  PHOS  --   --   --   --   --   --   --   --   --   --   --   --   --   --   --  3.8 4.0  --  3.0   < > = values in this interval not displayed.   GFR: Estimated Creatinine Clearance: 51.1 mL/min (A) (by C-G formula based on SCr of 1.25 mg/dL (H)). Recent Labs  Lab 06/10/18 0208 06/10/18 0525 06/10/18 0911 06/10/18 1800 06/11/18 0405  WBC  --  15.4* 12.2* 8.9 6.6  LATICACIDVEN 4.5* 7.8*  --   --   --     Liver Function Tests: Recent Labs  Lab Jul 08, 2018 2054 06/10/18 1800  AST 162* 88*  ALT 126* 104*  ALKPHOS 62 32*  BILITOT 0.9 0.7  PROT 5.3* 4.8*  ALBUMIN 3.0* 2.5*   Recent Labs  Lab 07/08/18 2054  LIPASE 40   No results for input(s): AMMONIA in the last 168 hours.  ABG    Component Value Date/Time   PHART 7.282 (L) 06/12/2018 0519   PCO2ART 45.0 06/12/2018 0519   PO2ART 85.0 06/12/2018 0519   HCO3 21.2 06/12/2018 0519   TCO2 23 06/12/2018 0519   ACIDBASEDEF 5.0 (H) 06/12/2018 0519   O2SAT 95.0 06/12/2018 0519     Coagulation Profile: Recent Labs  Lab 06/10/18 1700 06/11/18 0405 06/11/18 1552 06/12/18 0203 06/12/18 1700  INR 5.6* 9.4* 4.4* 3.7* 2.4*    Cardiac Enzymes: Recent Labs  Lab 06/10/18 0208 06/10/18 0525 06/10/18  0911 06/10/18 1409 06/10/18 1800  TROPONINI 0.73* 0.96* 1.39* 1.49* 1.55*    HbA1C: No results found for: HGBA1C  CBG: Recent Labs  Lab 06/12/18 0140 06/12/18 0457 06/12/18 0842 06/12/18 1226 06/12/18 1515  GLUCAP 170* 110* 82 110* 115*   CXR 4/12: personally reviewed - new R lower lung consolidation   Assessment & Plan:  Critically ill due to acute respiratory failure require mechanical ventilation  Ventilator asynchrony, worsening hypoxia with consolidation due to aspiration or contusion from CPR. Reactive airways from fluid overload. Airway compromise secondary to post arrest ischemic hypoxic encephalopathy Ischemic hypoxic encephalopathy on therapeutic temperature management Critically ill due to vasopressor dependent shock requiring titration of norepinephrine. RV dysfunction present on echocardiogram consistent with possible pulmonary embolism Status post ventricular fibrillation cardiac arrest - cause still unclear. Atrial fibrillation on warfarin Low  probability COVID rule out Hypokalemia may have contributed to arrhythmia Warfarin induced coagulopathy.   PLAN:  Continue full ventilatory support Increase PEEP Stop  Initiate enteral pain medications and decrease infusions  Bronchodilators Hold on antibiotics given normal WBC. Respiratory cultures. Hold Heparin for PE Proceed with CT head EEG once COVID-19 ruled out Formal echocardiogram once COVID-19 ruled out. Continue IV amiodarone for now. Consider pulmonary CT angios to rule out pulmonary embolism   Best practice:  Diet: No enteral access.  Pain/Anxiety/Delirium protocol (if indicated): Sedation while on NMB VAP protocol (if indicated): Bundle in place DVT prophylaxis: Therapeutic heparin IV GI prophylaxis: Pantoprazole Urinary catheter: Guide hemodynamic management Glucose control: Phase 1 basal bolus insulin Mobility: Bedrest Code Status: Full code Family Communication: Spoke to patient's  wife Meriam Sprague who is a Lexicographer.  She denies any credible COVID-19 exposure. Disposition: ICU  Critical care time: 50 min including chart data review, examination of patient, multidisciplinary rounds, and frequent assessment and modification of ventilator settings, sedation and TTM vasopressors and antiarrhythmics.   Lynnell Catalan, MD [facility] ICU Physician [facility]  Pager: [phone] Mobile: [phone] After hours: [phone].  06/12/2018, 6:51 PM

## 2018-06-12 NOTE — Progress Notes (Addendum)
PCCM Brief Interval Note  Called to see patient at bedside by ELink. Patient discussed and assessed with Dr. Ardeth Perfect PCCM. Elink has been addressing worsening hypoxia in this patient (Post-VF arrest with ROSC, s/p TTM, s/p nimbex gtt. Low-suspicion but high-risk COVID-19 rule out). ELink has increased FiO2 to 100%, PEEP 12 and increased sedation. CXR ordered by ELink.   ABG at 2200: 7.278/50.9/53.0/25/4/23.9/82 at 36.8 degrees C  HR 97 BP 105/48 MAPs 63 SpO2 90% CVP 22  Patient is net positive +7.6L   Plan: - Clinically do not suspect prone position will have much benefit - Based on CXR, roll patient on Left side  - Increased I Time, RR to 24 - Inflated ETT cuff 65ml for cuff leak  - STAT VBG - Check proBNP  - Continuous CVP monitoring if able (variable CVP-- initially 22, now 10-13) - Consider increasing schedule of diuresis pending VBG result  -Pending VBG, may also reconsider pressors (may have benefit from addition of milrinone)   Additional Critical Care Time: 35 minutes   Tessie Fass MSN, AGACNP-BC  Pulmonary/Critical Care Medicine [phone] If no answer, [phone] 06/12/2018, 11:56 PM

## 2018-06-12 NOTE — Progress Notes (Signed)
Hypoglycemic Event  CBG: 38   Treatment: 1 amp D50  Symptoms: None  Follow-up CBG: Time: 1:40 CBG Result:170  Possible Reasons for Event: Inadequate meal intake and Unknown  Comments/MD notified:Will continue to monitor patient     [employer]

## 2018-06-12 NOTE — Progress Notes (Signed)
Inpatient Diabetes Program Recommendations  AACE/ADA: New Consensus Statement on Inpatient Glycemic Control (2015)  Target Ranges:  Prepandial:   less than 140 mg/dL      Peak postprandial:   less than 180 mg/dL (1-2 hours)      Critically ill patients:  140 - 180 mg/dL   Results for TYWON, SOBANSKI (MRN [id number]) as of 06/12/2018 13:53  Ref. Range 06/10/2018 23:01 06/11/2018 04:08 06/11/2018 08:24 06/11/2018 12:12 06/11/2018 15:52 06/11/2018 21:29  Glucose-Capillary Latest Ref Range: 70 - 99 mg/dL 151 (H)  3 units NOVOLOG 126 (H)  3 units NOVOLOG  94 94 106 (H) 100 (H)   Results for DAVAUN, NAJERA (MRN [id number]) as of 06/12/2018 13:53  Ref. Range 06/12/2018 01:16 06/12/2018 01:40 06/12/2018 04:57 06/12/2018 08:42  Glucose-Capillary Latest Ref Range: 70 - 99 mg/dL 38 (LL) 062 (H) 694 (H) 82    Admit with: Cardiac arrest at home with Resp Failure  History: CKD, CVA  NO documented History of Diabetes  Current Orders: Novolog Resistant Correction Scale/ SSI (0-20 units) Q4 hours      MD- Patient with Severe Hypoglycemia at 1am today.  Last dose Novolog was given 4am on 04/12.  NO other Insulin given since then.  Not sure what caused the HYPO event?  May consider reduction of Novolog SSI to Sensitive scale (0-9 units) Q4 hours for now (currently on the Resistant scale 0-20 units Q4 hours)      --Will follow patient during hospitalization--  Ambrose Finland RN, MSN, CDE Diabetes Coordinator Inpatient Glycemic Control Team Team Pager: [phone] (8a-5p)

## 2018-06-12 NOTE — Progress Notes (Signed)
eLink Physician-Brief Progress Note Patient Name: Jacek Kocinski DOB: 19-Aug-1940 MRN: [id number]   Date of Service  06/12/2018  HPI/Events of Note  Camera; Discussed with RT about sats 86%. 510/8/20/100% On fenta 200 and versed at 2. Off of nimbex. 126/83, HR 98. qtc ok. Sinus.   eICU Interventions  - increase PEEP to 12. Follow ABG in 40 mins - CxR Pneumonia.  S/p V fib arrest.  - increase versed to 5 mg/hr.      Intervention Category Major Interventions: Respiratory failure - evaluation and management Minor Interventions: Communication with other healthcare providers and/or family  Ranee Gosselin 06/12/2018, 9:14 PM

## 2018-06-12 NOTE — Telephone Encounter (Signed)
Spoke with pt's wife who wanted to inform Dr. Rennis Golden that pt is currently in ICU. She state she is a Engineer, civil (consulting) at [employer], and will be filling for FMLA so she can take care of pt once he return home. Wife advised to contact medical records for protocol and guideline for submitting FMLA forms.

## 2018-06-12 NOTE — Telephone Encounter (Signed)
Ok .. thanks for notifying me. Looks like he was admitted under a different name - the charts are now marked to merge.  Dr. Rexene Edison

## 2018-06-12 NOTE — Progress Notes (Signed)
eLink Physician-Brief Progress Note Patient Name: Ross Lewis DOB: Oct 19, 1940 MRN: [id number]   Date of Service  06/12/2018  HPI/Events of Note  Asking for ABG. On Nimbex, resp failure from PNA.   eICU Interventions  ABG ordered, if worsening , follow CxR.     Intervention Category Intermediate Interventions: Respiratory distress - evaluation and management  Ranee Gosselin 06/12/2018, 5:19 AM

## 2018-06-12 NOTE — Telephone Encounter (Signed)
New Message    Pts wife is calling because the pt is in ICU at [facility] and she wanted to let Dr Rennis Golden know  Pts wife is also going to file for FMLA   Please call

## 2018-06-12 NOTE — Progress Notes (Addendum)
eLink Physician-Brief Progress Note Patient Name: Leng Quispe DOB: Jul 08, 1940 MRN: [id number]   Date of Service  06/12/2018  HPI/Events of Note  ARDS, on Fenta 300, Versed 7 and on PEEP 12. P/F 53, worsening. S/p v fib arrest. ROSC after 15 mins. Suspected covid 19. Ischemic hypoxic encephalopahty. A fib. Re warming. RV strain possible PE. On heparin gtt. CTA not done due to TTM.   eICU Interventions  - discussed with bed side NP/team about bed side eval for proning. - get Stat CxR.  - Nimbex paralysis, if going for Prone.       Intervention Category Intermediate Interventions: Respiratory distress - evaluation and management;Diagnostic test evaluation;Communication with other healthcare providers and/or family  Ranee Gosselin 06/12/2018, 11:20 PM   00:11 AM CxR film seen and compared showing worsening rt effusion/atelectasis with multifocal PNA rt> left. ET in place. No Pneumothorax.   Discussed with bed side RN. Camera: sats 88%. Supine position. MAP soft.  P peak pressure OK.   Bed side team ordering lasix/other orders as per bed side RN.

## 2018-06-13 ENCOUNTER — Inpatient Hospital Stay (HOSPITAL_COMMUNITY): Payer: Medicare Other

## 2018-06-13 DIAGNOSIS — J9601 Acute respiratory failure with hypoxia: Secondary | ICD-10-CM

## 2018-06-13 DIAGNOSIS — R570 Cardiogenic shock: Secondary | ICD-10-CM | POA: Diagnosis present

## 2018-06-13 LAB — BRAIN NATRIURETIC PEPTIDE: B Natriuretic Peptide: 134.5 pg/mL — ABNORMAL HIGH (ref 0.0–100.0)

## 2018-06-13 LAB — CBC WITH DIFFERENTIAL/PLATELET
Abs Immature Granulocytes: 0.02 10*3/uL (ref 0.00–0.07)
Basophils Absolute: 0 10*3/uL (ref 0.0–0.1)
Basophils Relative: 1 %
Eosinophils Absolute: 0 10*3/uL (ref 0.0–0.5)
Eosinophils Relative: 0 %
HCT: 40.2 % (ref 39.0–52.0)
Hemoglobin: 12.5 g/dL — ABNORMAL LOW (ref 13.0–17.0)
Immature Granulocytes: 0 %
Lymphocytes Relative: 6 %
Lymphs Abs: 0.5 10*3/uL — ABNORMAL LOW (ref 0.7–4.0)
MCH: 31.6 pg (ref 26.0–34.0)
MCHC: 31.1 g/dL (ref 30.0–36.0)
MCV: 101.5 fL — ABNORMAL HIGH (ref 80.0–100.0)
Monocytes Absolute: 0.5 10*3/uL (ref 0.1–1.0)
Monocytes Relative: 6 %
Neutro Abs: 6.8 10*3/uL (ref 1.7–7.7)
Neutrophils Relative %: 87 %
Platelets: DECREASED 10*3/uL (ref 150–400)
RBC: 3.96 MIL/uL — ABNORMAL LOW (ref 4.22–5.81)
RDW: 14.8 % (ref 11.5–15.5)
WBC: 7.8 10*3/uL (ref 4.0–10.5)
nRBC: 0 % (ref 0.0–0.2)

## 2018-06-13 LAB — POCT I-STAT 7, (LYTES, BLD GAS, ICA,H+H)
Bicarbonate: 25.3 mmol/L (ref 20.0–28.0)
Calcium, Ion: 1.21 mmol/L (ref 1.15–1.40)
HCT: 35 % — ABNORMAL LOW (ref 39.0–52.0)
Hemoglobin: 11.9 g/dL — ABNORMAL LOW (ref 13.0–17.0)
O2 Saturation: 95 %
Patient temperature: 37
Potassium: 4.1 mmol/L (ref 3.5–5.1)
Sodium: 140 mmol/L (ref 135–145)
TCO2: 27 mmol/L (ref 22–32)
pCO2 arterial: 44.8 mmHg (ref 32.0–48.0)
pH, Arterial: 7.359 (ref 7.350–7.450)
pO2, Arterial: 80 mmHg — ABNORMAL LOW (ref 83.0–108.0)

## 2018-06-13 LAB — NOVEL CORONAVIRUS, NAA (HOSP ORDER, SEND-OUT TO REF LAB; TAT 18-24 HRS): SARS-CoV-2, NAA: NOT DETECTED

## 2018-06-13 LAB — BASIC METABOLIC PANEL
Anion gap: 10 (ref 5–15)
BUN: 45 mg/dL — ABNORMAL HIGH (ref 8–23)
CO2: 21 mmol/L — ABNORMAL LOW (ref 22–32)
Calcium: 7.7 mg/dL — ABNORMAL LOW (ref 8.9–10.3)
Chloride: 109 mmol/L (ref 98–111)
Creatinine, Ser: 1.73 mg/dL — ABNORMAL HIGH (ref 0.61–1.24)
GFR calc Af Amer: 43 mL/min — ABNORMAL LOW (ref >60)
GFR calc non Af Amer: 37 mL/min — ABNORMAL LOW (ref >60)
Glucose, Bld: 118 mg/dL — ABNORMAL HIGH (ref 70–99)
Potassium: 4.5 mmol/L (ref 3.5–5.1)
Sodium: 140 mmol/L (ref 135–145)

## 2018-06-13 LAB — POCT I-STAT EG7
Acid-base deficit: 4 mmol/L — ABNORMAL HIGH (ref 0.0–2.0)
Bicarbonate: 23.5 mmol/L (ref 20.0–28.0)
Calcium, Ion: 1.16 mmol/L (ref 1.15–1.40)
HCT: 37 % — ABNORMAL LOW (ref 39.0–52.0)
Hemoglobin: 12.6 g/dL — ABNORMAL LOW (ref 13.0–17.0)
O2 Saturation: 47 %
Potassium: 4.1 mmol/L (ref 3.5–5.1)
Sodium: 141 mmol/L (ref 135–145)
TCO2: 25 mmol/L (ref 22–32)
pCO2, Ven: 52.7 mmHg (ref 44.0–60.0)
pH, Ven: 7.257 (ref 7.250–7.430)
pO2, Ven: 30 mmHg — CL (ref 32.0–45.0)

## 2018-06-13 LAB — COOXEMETRY PANEL
Carboxyhemoglobin: 0.9 % (ref 0.5–1.5)
Carboxyhemoglobin: 1 % (ref 0.5–1.5)
Methemoglobin: 1.4 % (ref 0.0–1.5)
Methemoglobin: 1.6 % — ABNORMAL HIGH (ref 0.0–1.5)
O2 Saturation: 74.5 %
O2 Saturation: 78.7 %
Total hemoglobin: 12.8 g/dL (ref 12.0–16.0)
Total hemoglobin: 11.3 g/dL — ABNORMAL LOW (ref 12.0–16.0)

## 2018-06-13 LAB — GLUCOSE, CAPILLARY
Glucose-Capillary: 105 mg/dL — ABNORMAL HIGH (ref 70–99)
Glucose-Capillary: 107 mg/dL — ABNORMAL HIGH (ref 70–99)
Glucose-Capillary: 113 mg/dL — ABNORMAL HIGH (ref 70–99)
Glucose-Capillary: 116 mg/dL — ABNORMAL HIGH (ref 70–99)
Glucose-Capillary: 120 mg/dL — ABNORMAL HIGH (ref 70–99)
Glucose-Capillary: 121 mg/dL — ABNORMAL HIGH (ref 70–99)
Glucose-Capillary: 81 mg/dL (ref 70–99)

## 2018-06-13 LAB — PROTIME-INR
INR: 2.2 — ABNORMAL HIGH (ref 0.8–1.2)
Prothrombin Time: 23.7 seconds — ABNORMAL HIGH (ref 11.4–15.2)

## 2018-06-13 LAB — HEPARIN LEVEL (UNFRACTIONATED)
Heparin Unfractionated: <0.1 IU/mL — ABNORMAL LOW (ref 0.30–0.70)
Heparin Unfractionated: <0.1 IU/mL — ABNORMAL LOW (ref 0.30–0.70)

## 2018-06-13 LAB — PHOSPHORUS: Phosphorus: 2.6 mg/dL (ref 2.5–4.6)

## 2018-06-13 LAB — MAGNESIUM: Magnesium: 1.5 mg/dL — ABNORMAL LOW (ref 1.7–2.4)

## 2018-06-13 MED ORDER — MAGNESIUM SULFATE 2 GM/50ML IV SOLN
2.0000 g | Freq: Once | INTRAVENOUS | Status: AC
  Administered 2018-06-13: 2 g via INTRAVENOUS
  Filled 2018-06-13: qty 50

## 2018-06-13 MED ORDER — PIPERACILLIN-TAZOBACTAM 3.375 G IVPB
3.3750 g | Freq: Three times a day (TID) | INTRAVENOUS | Status: DC
  Administered 2018-06-14 – 2018-06-16 (×8): 3.375 g via INTRAVENOUS
  Filled 2018-06-13 (×8): qty 50

## 2018-06-13 MED ORDER — MILRINONE LACTATE IN DEXTROSE 20-5 MG/100ML-% IV SOLN
0.1250 ug/kg/min | INTRAVENOUS | Status: DC
  Administered 2018-06-13 (×2): 0.25 ug/kg/min via INTRAVENOUS
  Administered 2018-06-14: 0.125 ug/kg/min via INTRAVENOUS
  Filled 2018-06-13 (×3): qty 100

## 2018-06-13 MED ORDER — VITAL 1.5 CAL PO LIQD
1000.0000 mL | ORAL | Status: DC
  Administered 2018-06-13: 1000 mL
  Filled 2018-06-13 (×2): qty 1000

## 2018-06-13 MED ORDER — FUROSEMIDE 10 MG/ML IJ SOLN: 80.0000 mg | Freq: Once | INTRAMUSCULAR | Status: DC

## 2018-06-13 MED ORDER — NOREPINEPHRINE 16 MG/250ML-% IV SOLN
0.0000 ug/min | INTRAVENOUS | Status: DC
  Administered 2018-06-13: 13 ug/min via INTRAVENOUS
  Filled 2018-06-13 (×3): qty 250

## 2018-06-13 MED ORDER — FUROSEMIDE 10 MG/ML IJ SOLN
80.0000 mg | Freq: Once | INTRAMUSCULAR | Status: AC
  Administered 2018-06-13: 80 mg via INTRAVENOUS
  Filled 2018-06-13: qty 8

## 2018-06-13 MED ORDER — FUROSEMIDE 10 MG/ML IJ SOLN
80.0000 mg | Freq: Every day | INTRAMUSCULAR | Status: DC
  Administered 2018-06-13 – 2018-06-14 (×2): 80 mg via INTRAVENOUS
  Filled 2018-06-13 (×2): qty 8

## 2018-06-13 NOTE — Progress Notes (Signed)
ANTICOAGULATION CONSULT NOTE   Pharmacy Consult for Heparin Indication: atrial fibrillation, possible PE  No Known Allergies  Patient Measurements: Height: 5\' 6"  (167.6 cm) Weight: 217 lb 6 oz (98.6 kg) IBW/kg (Calculated) : 63.8 Heparin Dosing Weight: 85.4  Vital Signs: Temp: 98.6 F (37 C) (04/14 0400) Temp Source: Bladder (04/14 0400) BP: 115/57 (04/14 0700) Pulse Rate: 98 (04/14 0700)  Labs: Recent Labs    06/10/18 0911 06/10/18 1409  06/10/18 1800  06/11/18 0405  06/12/18 0203  06/12/18 1700 06/12/18 2207 06/13/18 0021 06/13/18 0509  HGB 14.3  --    < > 14.3   < > 14.7   < >  --    < >  --  13.9 12.6* 12.5*  HCT 44.8  --    < > 43.5   < > 42.5   < >  --    < >  --  41.0 37.0* 40.2  PLT PLATELET CLUMPS NOTED ON SMEAR, COUNT APPEARS DECREASED  --   --  120*  --  137*  --   --   --   --   --   --  PLATELET CLUMPS NOTED ON SMEAR, COUNT APPEARS DECREASED  LABPROT  --   --    < >  --   --  74.2*   < > 36.0*  --  25.5*  --   --  23.7*  INR  --   --    < >  --   --  9.4*   < > 3.7*  --  2.4*  --   --  2.2*  HEPARINUNFRC  --   --   --   --   --   --   --   --   --   --   --   --  <0.10*  CREATININE  --   --   --   --   --  1.25*  --   --   --   --   --   --  1.73*  TROPONINI 1.39* 1.49*  --  1.55*  --   --   --   --   --   --   --   --   --    < > = values in this interval not displayed.    Estimated Creatinine Clearance: 38.1 mL/min (A) (by C-G formula based on SCr of 1.73 mg/dL (H)).   Medical History: Past Medical History:  Diagnosis Date  . Atrial fibrillation ([name])   . Chronic back pain   . CKD (chronic kidney disease), stage II   . Hypertension   . Nephrolithiasis   . Stroke [facility])    right cerebellar infarct  . TIA (transient ischemic attack)     Medications:  Cardizem  Flomax  Coumadin 5 mg daily except 7.5 mg Fridays  Assessment: 78 y.o. male admitted s/p cardiac arrest, now with ROSC and placed on hypothermia protocol, h/o Afib and Coumadin  on hold, for heparin.  Per most recent Coumadin Clinic note, INR goal was 2.5-3.5. INR  increased to >9 and received IV Vit K on 4/12  Heparin level undetectable this morning, no pauses or problems with line per nurse.    Goal of Therapy:  INR 2.5-3.5 Heparin level 0.3-0.7 units/ml Monitor platelets by anticoagulation protocol: Yes   Plan:  Increase heparin drip 1200 units/hr  Check 8hr level Daily INR, HL, CBC  Ewing Schlein, PharmD PGY1 Pharmacy Resident 06/13/2018  7:41 AM Please check AMION for all [facility] Pharmacy numbers

## 2018-06-13 NOTE — Progress Notes (Signed)
RN called due to patients SATs dropping to the low to mid 80's post bath. Patient suctioned and recruitment maneuver performed PC 28, rate 10, PEEP 12, FiO2 100%, and I-time 3.0 for 2 minutes. Patient tolerated well and SATs maintaining at 94%. RN at bedside. Will continue to monitor.

## 2018-06-13 NOTE — Progress Notes (Signed)
NAME:  Ross Lewis, MRN:  [id number], DOB:  08-04-1940 06/20/2018, LOS: 4 ADMISSION DATE:  06/26/2018, CONSULTATION DATE: 4/10/ 2020 REFERRING MD: Vallery Ridge, CHIEF COMPLAINT: Status post cardiac arrest  HPI/course in hospital  78 year old man with limited past medical history. Suffered a witnessed cardiac arrest at home.  CPR initiated after 3 minutes.  Defibrillated from ventricular fibrillation.  Spontaneous circulation restored after 15 minutes. Placed in isolation for reported COVID-19 contact. Started on TTM.  +++ Name is incorrect: Correct name is Ross Lewis 04-22-1940 +++  Past Medical History    . Atrial fibrillation ([name]) followed by Dr. Rennis Golden on warfarin.   . Chronic back pain   . CKD (chronic kidney disease), stage II   . Hypertension   . Nephrolithiasis   . Stroke [facility]) followed by Dr. Pearlean Brownie     right cerebellar infarct  . TIA (transient ischemic attack) last in March    Interim history/subjective:  TTM discontinued. Asynchrony improved with NMB Has been off neuromuscular blockers for the last 20 hours Desaturations noted this morning that required intervention-still on increased PEEP, increased FiO2  Objective   Blood pressure (!) 101/51, pulse 93, temperature 98.4 F (36.9 C), temperature source Core, resp. rate (!) 24, height  (1.676 m), weight 98.6 kg, SpO2 97 %. CVP:  [0 mmHg-22 mmHg] 12 mmHg  Vent Mode: PRVC FiO2 (%):  [100 %] 100 % Set Rate:  [20 bmp-24 bmp] 24 bmp Vt Set:  [510 mL] 510 mL PEEP:  [8 cmH20-12 cmH20] 12 cmH20 Plateau Pressure:  [20 cmH20-21 cmH20] 21 cmH20   Intake/Output Summary (Last 24 hours) at 06/13/2018 0911 Last data filed at 06/13/2018 0900 Gross per 24 hour  Intake 2246.41 ml  Output 1945 ml  Net 301.41 ml   Filed Weights   06/11/18 0300 06/12/18 0423 06/13/18 0500  Weight: 93.1 kg 92.7 kg 98.6 kg   Examination: Elderly gentleman, sedate, unresponsive, not responsive to painful stimuli ET tube  and OG tube in place, moist oral mucosa Pupils about 2 mm, minimal reaction S1-S2 appreciated Breath sounds appreciated, decreased Abdomen is soft, bowel sounds appreciated Extremities shows no clubbing, no edema  A bedside point-of-care echocardiogram 4/11 which showed normal LV size with hyperdynamic function.  RV dilatation with moderate dysfunction.  No significant valvular abnormalities noted.  No pericardial effusion  Ancillary tests (personally reviewed)  CBC: Recent Labs  Lab 06/07/2018 2054  06/10/18 0525  06/10/18 0911  06/10/18 1800  06/11/18 0405  06/11/18 1554 06/12/18 0519 06/12/18 2207 06/13/18 0021 06/13/18 0509  WBC 11.9*   < > 15.4*  --  12.2*  --  8.9  --  6.6  --   --   --   --   --  7.8  NEUTROABS 6.8  --   --   --   --   --   --   --   --   --   --   --   --   --  6.8  HGB 14.0   < > 14.8   < > 14.3   < > 14.3   < > 14.7   < > 14.3 13.3 13.9 12.6* 12.5*  HCT 44.9   < > 47.1   < > 44.8   < > 43.5   < > 42.5   < > 42.0 39.0 41.0 37.0* 40.2  MCV 102.5*   < > 103.7*  --  101.4*  --  96.7  --  94.9  --   --   --   --   --  101.5*  PLT 144*   < > 181  --  PLATELET CLUMPS NOTED ON SMEAR, COUNT APPEARS DECREASED  --  120*  --  137*  --   --   --   --   --  PLATELET CLUMPS NOTED ON SMEAR, COUNT APPEARS DECREASED   < > = values in this interval not displayed.    Basic Metabolic Panel: Recent Labs  Lab 12-05-18 2054  12-05-18 2338  06/10/18 0525  06/10/18 1409 06/10/18 1637  06/11/18 0405  06/11/18 0827  06/11/18 1214 06/11/18 1554 06/11/18 2131 06/12/18 0203 06/12/18 0519 06/12/18 1700 06/12/18 2207 06/13/18 0021 06/13/18 0509  NA 142   < > 139   < > 140   < >  --  143   < > 138   < > 141   < > 141 141  --   --  142  --  141 141 140  K 3.8   < > 3.5   < > 3.2*   < >  --  2.6*   < > 3.9   < > 4.0   < > 4.0 4.2  --   --  4.7  --  4.4 4.1 4.5  CL 114*  --  111  --  110  --   --   --   --  109  --   --   --   --   --   --   --   --   --   --   --  109  CO2  18*  --  16*  --  15*  --   --   --   --  19*  --   --   --   --   --   --   --   --   --   --   --  21*  GLUCOSE 203*  --  309*  --  278*  --   --  129*  --  140*  --  96  --  102*  --   --   --   --   --   --   --  118*  BUN 14  --  15  --  15  --   --   --   --  19  --   --   --   --   --   --   --   --   --   --   --  45*  CREATININE 1.67*  --  1.31*  --  1.57*  --   --   --   --  1.25*  --   --   --   --   --   --   --   --   --   --   --  1.73*  CALCIUM 8.3*  --  8.0*  --  8.1*  --   --   --   --  8.0*  --   --   --   --   --   --   --   --   --   --   --  7.7*  MG  --   --   --   --   --   --  1.7  --   --   --   --   --   --   --   --  1.7 1.7  --  1.8  --   --  1.5*  PHOS  --   --   --   --   --   --   --   --   --   --   --   --   --   --   --  3.8 4.0  --  3.0  --   --  2.6   < > = values in this interval not displayed.   GFR: Estimated Creatinine Clearance: 38.1 mL/min (A) (by C-G formula based on SCr of 1.73 mg/dL (H)). Recent Labs  Lab 06/10/18 0208 06/10/18 0525 06/10/18 0911 06/10/18 1800 06/11/18 0405 06/13/18 0509  WBC  --  15.4* 12.2* 8.9 6.6 7.8  LATICACIDVEN 4.5* 7.8*  --   --   --   --     Liver Function Tests: Recent Labs  Lab 03-Jul-2018 2054 06/10/18 1800  AST 162* 88*  ALT 126* 104*  ALKPHOS 62 32*  BILITOT 0.9 0.7  PROT 5.3* 4.8*  ALBUMIN 3.0* 2.5*   Recent Labs  Lab 2018-07-03 2054  LIPASE 40   No results for input(s): AMMONIA in the last 168 hours.  ABG    Component Value Date/Time   PHART 7.278 (L) 06/12/2018 2207   PCO2ART 50.9 (H) 06/12/2018 2207   PO2ART 53.0 (L) 06/12/2018 2207   HCO3 23.5 06/13/2018 0021   TCO2 25 06/13/2018 0021   ACIDBASEDEF 4.0 (H) 06/13/2018 0021   O2SAT 78.7 06/13/2018 0525     Coagulation Profile: Recent Labs  Lab 06/11/18 0405 06/11/18 1552 06/12/18 0203 06/12/18 1700 06/13/18 0509  INR 9.4* 4.4* 3.7* 2.4* 2.2*    Cardiac Enzymes: Recent Labs  Lab 06/10/18 0208 06/10/18 0525 06/10/18 0911  06/10/18 1409 06/10/18 1800  TROPONINI 0.73* 0.96* 1.39* 1.49* 1.55*    HbA1C: No results found for: HGBA1C  CBG: Recent Labs  Lab 06/12/18 1515 06/12/18 2008 06/13/18 0018 06/13/18 0519 06/13/18 0844  GLUCAP 115* 113* 121* 116* 81   CXR 4/12: personally reviewed - new R lower lung consolidation  Chest x-ray 4/14:-Marginally worse, reviewed by myself   Nobel coronavirus testing-still pending  Assessment & Plan:   Critically ill gentleman with acute respiratory failure, continues on mechanical ventilation Ventilator asynchrony was managed with paralytics, paralytics have been off for over 20 hours  Acute hypoxemic respiratory failure -Continue full ventilator support -Ventilator adjustments to maintain saturations -Did require ventilator changes during the night to recruit his lungs  -Maintaining adequate saturations at present -Follow-up arterial blood gases -Will adjust sedating medications as tolerated  status post ventricular fibrillation cardiac arrest Status post attempt targeted temperature management -Cause of his V. fib arrest is unclear  Ischemic hypoxic encephalopathy -He was on TTM -Concern for significant neurological damage -Further testing hampered byCOVID test and hemodynamic/respiratory instability -Further testing when more stable  Atrial fibrillation -Continue anticoagulation  Low probability of COVID, history of possible exposure -being ruled out  Coagulopathy -Correcting  Abnormal chest x-ray with multifocal infiltrate -Likely multifactorial -Has no ongoing signs of infection -Likely related to pulmonary contusion -Antibiotics remain on hold as he has not had any fever nor leukocytosis -Respiratory cultures not showing any organisms, MRSA PCR negative  PLAN:  Proceed with CT head EEG once COVID-19 ruled out Formal echocardiogram once COVID-19 ruled out. Continue IV amiodarone for now. Consider pulmonary CT angios to rule out  pulmonary embolism   Best practice:  Diet: No enteral access.  Pain/Anxiety/Delirium protocol (if indicated):  Sedation while on NMB VAP protocol (if indicated): Bundle in place DVT prophylaxis: Therapeutic heparin IV GI prophylaxis: Pantoprazole Urinary catheter: Guide hemodynamic management Glucose control: Phase 1 basal bolus insulin Mobility: Bedrest Code Status: Full code Family Communication: Spoke to patient's wife Meriam Sprague who is a Lexicographer.  She denies any credible COVID-19 exposure. I did update patient's spouse at 30 06/13/2018 -She would like to be contacted if anything significant changes on him -She was quite particular about not wanting him to pass alone Disposition: ICU  Critical care time spent evaluating patient, reviewing records, formulating plan of care is 40 minutes  Virl Diamond, MD   06/13/2018, 9:11 AM

## 2018-06-13 NOTE — Progress Notes (Signed)
Novel coronavirus testing was negative  Patient was low risk  I had spoken to his spouse this morning who said there was no history of contact  Discontinue isolation precautions

## 2018-06-13 NOTE — Progress Notes (Signed)
eLink Physician-Brief Progress Note Patient Name: Ross Lewis DOB: 11/17/1940 MRN: [id number]   Date of Service  06/13/2018  HPI/Events of Note  Fever to 100.9 - Request for Tylenol. AST = 88 and ALT = 104. Therefore, can't use Tylenol. Creatinine = 1.73. Therefore, can't use Motrin. CXR from this AM with worsening multifocal pneumonia. Aspiration?  eICU Interventions  Will order: 1. Blood Cultures X 2.  2. Tracheal Aspirate Culture. 3. Zosyn per pharmacy consultation.  4. Ice packs PRN.      Intervention Category Major Interventions: Other:  Lenell Antu 06/13/2018, 10:40 PM

## 2018-06-13 NOTE — Progress Notes (Signed)
Pharmacy Antibiotic Note  Ross Lewis is a 78 y.o. male admitted on 07-02-18 with vfib arrest now with possible aspiration pneumonia.  Pharmacy has been consulted for Zosyn dosing.  Plan: Zosyn 3.375g IV q8h (4 hour infusion).  Will monitor culture, fever curve, and renal function Follow up length of therapy with clinical improvement  Height: 5\' 6"  (167.6 cm) Weight: 217 lb 6 oz (98.6 kg) IBW/kg (Calculated) : 63.8  Temp (24hrs), Avg:99.3 F (37.4 C), Min:98.1 F (36.7 C), Max:100.9 F (38.3 C)  Recent Labs  Lab 07-02-2018 2054 Jul 02, 2018 2338 06/10/18 0208 06/10/18 0525 06/10/18 0911 06/10/18 1800 06/11/18 0405 06/13/18 0509  WBC 11.9* 10.0  --  15.4* 12.2* 8.9 6.6 7.8  CREATININE 1.67* 1.31*  --  1.57*  --   --  1.25* 1.73*  LATICACIDVEN  --   --  4.5* 7.8*  --   --   --   --     Estimated Creatinine Clearance: 38.1 mL/min (A) (by C-G formula based on SCr of 1.73 mg/dL (H)).    No Known Allergies  Antimicrobials this admission: Zosyn 4/14 >>   Dose adjustments this admission: None  Microbiology results: 4/14 BCx: sent 4/14 TA: pending  4/11 MRSA PCR: negative  Thank you for allowing pharmacy to be a part of this patient's care.  Danae Orleans, PharmD PGY1 Pharmacy Resident Phone [phone] 06/13/2018       11:01 PM

## 2018-06-13 NOTE — Progress Notes (Signed)
Spoke with Felipe Drone, MD with Infectious Disease. Updated on Olarere MD with CCM order to discontinue precautions based on low risk history. Updated on pt admission history and aware of recent COVID19 negative test result.   MD agrees pt is low risk, however, because pt appears to be spiking temp (currently 37.7) now that arctic sun pads are discontinued, orders to keep pt on precautions for another 24 hours to monitor.   RN will continue to monitor.

## 2018-06-13 NOTE — Progress Notes (Signed)
Sputum sample obtained and sent to lab by RT. 

## 2018-06-14 ENCOUNTER — Inpatient Hospital Stay (HOSPITAL_COMMUNITY): Payer: Medicare Other

## 2018-06-14 DIAGNOSIS — J9621 Acute and chronic respiratory failure with hypoxia: Secondary | ICD-10-CM

## 2018-06-14 DIAGNOSIS — R400 Somnolence: Secondary | ICD-10-CM

## 2018-06-14 LAB — CBC WITH DIFFERENTIAL/PLATELET
Abs Immature Granulocytes: 0.07 10*3/uL (ref 0.00–0.07)
Basophils Absolute: 0 10*3/uL (ref 0.0–0.1)
Basophils Relative: 0 %
Eosinophils Absolute: 0 10*3/uL (ref 0.0–0.5)
Eosinophils Relative: 1 %
HCT: 36 % — ABNORMAL LOW (ref 39.0–52.0)
Hemoglobin: 11.4 g/dL — ABNORMAL LOW (ref 13.0–17.0)
Immature Granulocytes: 1 %
Lymphocytes Relative: 8 %
Lymphs Abs: 0.7 10*3/uL (ref 0.7–4.0)
MCH: 32 pg (ref 26.0–34.0)
MCHC: 31.7 g/dL (ref 30.0–36.0)
MCV: 101.1 fL — ABNORMAL HIGH (ref 80.0–100.0)
Monocytes Absolute: 0.8 10*3/uL (ref 0.1–1.0)
Monocytes Relative: 9 %
Neutro Abs: 7 10*3/uL (ref 1.7–7.7)
Neutrophils Relative %: 81 %
Platelets: 114 10*3/uL — ABNORMAL LOW (ref 150–400)
RBC: 3.56 MIL/uL — ABNORMAL LOW (ref 4.22–5.81)
RDW: 14.7 % (ref 11.5–15.5)
WBC: 8.6 10*3/uL (ref 4.0–10.5)
nRBC: 0 % (ref 0.0–0.2)

## 2018-06-14 LAB — ABO/RH: ABO/RH(D): O POS

## 2018-06-14 LAB — BASIC METABOLIC PANEL
Anion gap: 8 (ref 5–15)
BUN: 53 mg/dL — ABNORMAL HIGH (ref 8–23)
CO2: 26 mmol/L (ref 22–32)
Calcium: 8.3 mg/dL — ABNORMAL LOW (ref 8.9–10.3)
Chloride: 106 mmol/L (ref 98–111)
Creatinine, Ser: 1.98 mg/dL — ABNORMAL HIGH (ref 0.61–1.24)
GFR calc Af Amer: 36 mL/min — ABNORMAL LOW (ref >60)
GFR calc non Af Amer: 31 mL/min — ABNORMAL LOW (ref >60)
Glucose, Bld: 132 mg/dL — ABNORMAL HIGH (ref 70–99)
Potassium: 3.6 mmol/L (ref 3.5–5.1)
Sodium: 140 mmol/L (ref 135–145)

## 2018-06-14 LAB — PROTIME-INR
INR: 1.8 — ABNORMAL HIGH (ref 0.8–1.2)
Prothrombin Time: 20.3 seconds — ABNORMAL HIGH (ref 11.4–15.2)

## 2018-06-14 LAB — HEPARIN LEVEL (UNFRACTIONATED)
Heparin Unfractionated: <0.1 IU/mL — ABNORMAL LOW (ref 0.30–0.70)
Heparin Unfractionated: <0.1 IU/mL — ABNORMAL LOW (ref 0.30–0.70)

## 2018-06-14 LAB — SARS CORONAVIRUS 2 BY RT PCR (HOSPITAL ORDER, PERFORMED IN ~~LOC~~ HOSPITAL LAB): SARS Coronavirus 2: NEGATIVE

## 2018-06-14 LAB — GLUCOSE, CAPILLARY
Glucose-Capillary: 112 mg/dL — ABNORMAL HIGH (ref 70–99)
Glucose-Capillary: 113 mg/dL — ABNORMAL HIGH (ref 70–99)
Glucose-Capillary: 140 mg/dL — ABNORMAL HIGH (ref 70–99)
Glucose-Capillary: 149 mg/dL — ABNORMAL HIGH (ref 70–99)

## 2018-06-14 LAB — COOXEMETRY PANEL
Carboxyhemoglobin: 1.5 % (ref 0.5–1.5)
Methemoglobin: 1.1 % (ref 0.0–1.5)
O2 Saturation: 78.7 %
Total hemoglobin: 11.8 g/dL — ABNORMAL LOW (ref 12.0–16.0)

## 2018-06-14 MED ORDER — VITAL 1.5 CAL PO LIQD
1000.0000 mL | ORAL | Status: AC
  Administered 2018-06-14: 1000 mL
  Filled 2018-06-14: qty 1000

## 2018-06-14 MED ORDER — HEPARIN BOLUS VIA INFUSION
2500.0000 [IU] | Freq: Once | INTRAVENOUS | Status: AC
  Administered 2018-06-14: 2500 [IU] via INTRAVENOUS
  Filled 2018-06-14: qty 2500

## 2018-06-14 MED ORDER — POTASSIUM CHLORIDE 20 MEQ PO PACK
40.0000 meq | PACK | Freq: Every day | ORAL | Status: DC
  Administered 2018-06-14 – 2018-06-17 (×4): 40 meq
  Filled 2018-06-14 (×4): qty 2

## 2018-06-14 NOTE — Progress Notes (Signed)
ANTICOAGULATION CONSULT NOTE   Pharmacy Consult for Heparin Indication: atrial fibrillation, possible PE  No Known Allergies  Patient Measurements: Height: 5\' 6"  (167.6 cm) Weight: 210 lb 8.6 oz (95.5 kg) IBW/kg (Calculated) : 63.8 Heparin Dosing Weight: 85.4  Vital Signs: Temp: 99.9 F (37.7 C) (04/15 0400) Temp Source: Bladder (04/15 0400) BP: 114/52 (04/15 0400) Pulse Rate: 100 (04/15 0504)  Labs: Recent Labs    06/12/18 0203  06/12/18 1700  06/13/18 0021 06/13/18 0509 06/13/18 1112 06/13/18 1652  HGB  --    < >  --    < > 12.6* 12.5* 11.9*  --   HCT  --    < >  --    < > 37.0* 40.2 35.0*  --   PLT  --   --   --   --   --  PLATELET CLUMPS NOTED ON SMEAR, COUNT APPEARS DECREASED  --   --   LABPROT 36.0*  --  25.5*  --   --  23.7*  --   --   INR 3.7*  --  2.4*  --   --  2.2*  --   --   HEPARINUNFRC  --   --   --   --   --  <0.10*  --  <0.10*  CREATININE  --   --   --   --   --  1.73*  --   --    < > = values in this interval not displayed.    Estimated Creatinine Clearance: 37.5 mL/min (A) (by C-G formula based on SCr of 1.73 mg/dL (H)).  Assessment: 78 y.o. male admitted s/p cardiac arrest, now with ROSC, h/o Afib and Coumadin on hold, for heparin.    Goal of Therapy:  Heparin level 0.3-0.7 units/ml Monitor platelets by anticoagulation protocol: Yes   Plan:  Increase heparin drip 1500 units/hr   Geannie Risen, PharmD, BCPS  06/14/2018 5:33 AM

## 2018-06-14 NOTE — Progress Notes (Addendum)
NAME:  Ross Lewis, MRN:  [id number], DOB:  12-10-1940 06/08/2018, LOS: 5 ADMISSION DATE:  06/26/2018, CONSULTATION DATE: 4/10/ 2020 REFERRING MD: Vallery Ridge, CHIEF COMPLAINT: Status post cardiac arrest  HPI/course in hospital  78 year old man with limited past medical history. Suffered a witnessed cardiac arrest at home.  CPR initiated after 3 minutes.  Defibrillated from ventricular fibrillation.  Spontaneous circulation restored after 15 minutes. Placed in isolation for reported COVID-19 contact. Started on TTM.  +++ Name is incorrect: Correct name is Ross Lewis [birth year] +++  Past Medical History    . Atrial fibrillation ([name]) followed by Dr. Rennis Golden on warfarin.   . Chronic back pain   . CKD (chronic kidney disease), stage II   . Hypertension   . Nephrolithiasis   . Stroke [facility] followed by Dr. Pearlean Brownie     right cerebellar infarct  . TIA (transient ischemic attack) last in March    Interim history/subjective:  Continues on vent with high FiO2/PEEP requirement Off levophed, remains on low dose milrinone Off sedation one hour this morning with no meaningful response.  COVID-19 testing came back negative yesterday, but due to ongoing fever and hypoxemic failure with possibly exposure, ID has elected to continue him on precautions.   Objective   Blood pressure (!) 110/55, pulse (!) 103, temperature 99.9 F (37.7 C), temperature source Bladder, resp. rate (!) 24, height  (1.676 m), weight 95.5 kg, SpO2 97 %. CVP:  [7 mmHg-12 mmHg] 7 mmHg  Vent Mode: PRVC FiO2 (%):  [90 %-100 %] 90 % Set Rate:  [24 bmp] 24 bmp Vt Set:  [510 mL] 510 mL PEEP:  [12 cmH20] 12 cmH20 Plateau Pressure:  [23 cmH20-26 cmH20] 23 cmH20   Intake/Output Summary (Last 24 hours) at 06/14/2018 0902 Last data filed at 06/14/2018 0800 Gross per 24 hour  Intake 1597.72 ml  Output 2665 ml  Net -1067.28 ml   Filed Weights   06/12/18 0423 06/13/18 0500 06/14/18 0500  Weight: 92.7 kg  98.6 kg 95.5 kg   Physical exam: limited, done through window with RN in room.  General:  Elderly male on vent in NAD Neuro:  No response to painful stimuli x 4 extremities.  HEENT:  [place]/AT, No JVD noted, PERRL Cardiovascular:  RRR, no MRG Lungs:  Bilaterally coarse by RN report.  Abdomen:  Soft, non-distended, non-tender Musculoskeletal:  No acute deformity Skin:  Intact, MMM  A bedside point-of-care echocardiogram 4/11 which showed normal LV size with hyperdynamic function.  RV dilatation with moderate dysfunction.  No significant valvular abnormalities noted.  No pericardial effusion  Ancillary tests (personally reviewed)  CBC: Recent Labs  Lab 06/20/2018 2054  06/10/18 0911  06/10/18 1800  06/11/18 0405  06/12/18 2207 06/13/18 0021 06/13/18 0509 06/13/18 1112 06/14/18 0546  WBC 11.9*   < > 12.2*  --  8.9  --  6.6  --   --   --  7.8  --  8.6  NEUTROABS 6.8  --   --   --   --   --   --   --   --   --  6.8  --  7.0  HGB 14.0   < > 14.3   < > 14.3   < > 14.7   < > 13.9 12.6* 12.5* 11.9* 11.4*  HCT 44.9   < > 44.8   < > 43.5   < > 42.5   < > 41.0 37.0* 40.2 35.0* 36.0*  MCV 102.5*   < >  101.4*  --  96.7  --  94.9  --   --   --  101.5*  --  101.1*  PLT 144*   < > PLATELET CLUMPS NOTED ON SMEAR, COUNT APPEARS DECREASED  --  120*  --  137*  --   --   --  PLATELET CLUMPS NOTED ON SMEAR, COUNT APPEARS DECREASED  --  114*   < > = values in this interval not displayed.    Basic Metabolic Panel: Recent Labs  Lab 06/12/2018 2338  06/10/18 0525  06/10/18 1409  06/11/18 0405  06/11/18 0827  06/11/18 1214  06/11/18 2131 06/12/18 0203  06/12/18 1700 06/12/18 2207 06/13/18 0021 06/13/18 0509 06/13/18 1112 06/14/18 0546  NA 139   < > 140   < >  --    < > 138   < > 141   < > 141   < >  --   --    < >  --  141 141 140 140 140  K 3.5   < > 3.2*   < >  --    < > 3.9   < > 4.0   < > 4.0   < >  --   --    < >  --  4.4 4.1 4.5 4.1 3.6  CL 111  --  110  --   --   --  109  --   --   --   --    --   --   --   --   --   --   --  109  --  106  CO2 16*  --  15*  --   --   --  19*  --   --   --   --   --   --   --   --   --   --   --  21*  --  26  GLUCOSE 309*  --  278*  --   --    < > 140*  --  96  --  102*  --   --   --   --   --   --   --  118*  --  132*  BUN 15  --  15  --   --   --  19  --   --   --   --   --   --   --   --   --   --   --  45*  --  53*  CREATININE 1.31*  --  1.57*  --   --   --  1.25*  --   --   --   --   --   --   --   --   --   --   --  1.73*  --  1.98*  CALCIUM 8.0*  --  8.1*  --   --   --  8.0*  --   --   --   --   --   --   --   --   --   --   --  7.7*  --  8.3*  MG  --   --   --   --  1.7  --   --   --   --   --   --   --  1.7 1.7  --  1.8  --   --  1.5*  --   --   PHOS  --   --   --   --   --   --   --   --   --   --   --   --  3.8 4.0  --  3.0  --   --  2.6  --   --    < > = values in this interval not displayed.   GFR: Estimated Creatinine Clearance: 32.7 mL/min (A) (by C-G formula based on SCr of 1.98 mg/dL (H)). Recent Labs  Lab 06/10/18 0208 06/10/18 0525  06/10/18 1800 06/11/18 0405 06/13/18 0509 06/14/18 0546  WBC  --  15.4*   < > 8.9 6.6 7.8 8.6  LATICACIDVEN 4.5* 7.8*  --   --   --   --   --    < > = values in this interval not displayed.    Liver Function Tests: Recent Labs  Lab 21-Jun-2018 2054 06/10/18 1800  AST 162* 88*  ALT 126* 104*  ALKPHOS 62 32*  BILITOT 0.9 0.7  PROT 5.3* 4.8*  ALBUMIN 3.0* 2.5*   Recent Labs  Lab 06/21/18 2054  LIPASE 40   No results for input(s): AMMONIA in the last 168 hours.  ABG    Component Value Date/Time   PHART 7.359 06/13/2018 1112   PCO2ART 44.8 06/13/2018 1112   PO2ART 80.0 (L) 06/13/2018 1112   HCO3 25.3 06/13/2018 1112   TCO2 27 06/13/2018 1112   ACIDBASEDEF 4.0 (H) 06/13/2018 0021   O2SAT 78.7 06/14/2018 0550     Coagulation Profile: Recent Labs  Lab 06/11/18 1552 06/12/18 0203 06/12/18 1700 06/13/18 0509 06/14/18 0546  INR 4.4* 3.7* 2.4* 2.2* 1.8*    Cardiac Enzymes:  Recent Labs  Lab 06/10/18 0208 06/10/18 0525 06/10/18 0911 06/10/18 1409 06/10/18 1800  TROPONINI 0.73* 0.96* 1.39* 1.49* 1.55*    HbA1C: No results found for: HGBA1C  CBG: Recent Labs  Lab 06/13/18 1110 06/13/18 1654 06/13/18 2029 06/13/18 2320 06/14/18 0553  GLUCAP 120* 113* 105* 107* 112*   CXR 4/12: personally reviewed - new R lower lung consolidation  Chest x-ray 4/14:-Marginally worse, reviewed by myself   [facility] coronavirus testing - negative 4/14  Assessment & Plan:  COVID R/O - Initial test negative for COVID, however ID has requested that patient be considered presumed positive secondary to fever. - Will obtain rapid COVID test and if negative discussed case with ID again, as no work-up can proceed until cleared (EEG, echocardiogram, VQ scan. Etc.).  Acute hypoxemic respiratory failure - etiology not entirely clear. Multifocal PNA on CXR, but this may be related to pulmonary contusion. Also some concern for COVID-19 infection. His test was negative, but continuing precautions per ID. Also some concern for PE. Desaturates with any staff interaction.  - Continue full ventilator support (90% and 12 PEEP) - Wean FiO2 first as tolerated to keep SpO2 > 90% - Follow-up arterial blood gases - Maintain off sedation if will tolerate. Has had issues with vent synchrony.  - CXR and ABG f/u in AM - Heparin per pharmacy. CTA vs VQ once off precautions.  - Started on Zosyn due to fevers 4/14, low threshold to add vancomycin for MRSA coverage as he has been inpatient several days.   Status post ventricular fibrillation cardiac arrest Status post attempt targeted temperature management -Cause of his V. fib arrest is unclear -Formal echo once off precautions. -Wean off milrinone   Ischemic hypoxic encephalopathy - TTM complete,  Concern for significant neurological damage. He remains unresponsive on day 5 post arrest.  -Further testing hampered by COVID precautions and  hemodynamic/respiratory instability -Further testing when more stable -CT head and EEG once off precautions.   Atrial fibrillation -Continue anticoagulation    Best practice:  Diet: OGT, TF Pain/Anxiety/Delirium protocol (if indicated): PRN sedation VAP protocol (if indicated): Bundle in place DVT prophylaxis: Therapeutic heparin IV GI prophylaxis: Pantoprazole Urinary catheter: Guide hemodynamic management Glucose control: Phase 1 basal bolus insulin Mobility: Bedrest Code Status: Full code Family Communication: Updated wife 4/15, she will call back in the afternoon with other family members regarding goals of care.   Disposition: ICU   Joneen Roach, AGACNP-BC [facility] Pulmonary/Critical Care Pager [phone] or [phone]  06/14/2018 9:38 AM

## 2018-06-14 NOTE — Progress Notes (Signed)
Second Coronavirus test with negative results reported to Pervis Hocking, MD. Airborne Precautions to remain in place at this time. Attending physician to call ID provider 06/15/2018 regarding whether precautions may be discontinued. Alphonzo Dublin, RN 06/14/2018 1640

## 2018-06-14 NOTE — Progress Notes (Signed)
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   eICU Interventions  Plan: 1. Rx with Versed IV and increase in Versed IV infusion.      Intervention Category Major Interventions: Other:  Lenell Antu 06/14/2018, 3:35 AM

## 2018-06-14 NOTE — Progress Notes (Signed)
Nutrition Follow-up  DOCUMENTATION CODES:   Obesity unspecified  INTERVENTION:   Increase tube feeding today:  Vital 1.5 at 41 ml/hr via OGT (984 ml) Pro-Stat 60 mL TID Provides 2076 kcals, 156 g of protein and 752 mL of free water Meeting 100% estimated calorie and protein needs  Tomorrow transition to:  -Osmolite 1.5 @ 41 ml/hr via OGT (984 ml) -60 ml Prostat TID  Provides: 2076 kcals, 152 grams protein, 7104m free water. Meets 100% of needs.   NUTRITION DIAGNOSIS:   Inadequate oral intake related to inability to eat as evidenced by NPO status.  Ongoing  GOAL:   Patient will meet greater than or equal to 90% of their needs  Met via TF  MONITOR:   Diet advancement, Vent status, Labs, Weight trends, I & O's, Skin, TF tolerance  REASON FOR ASSESSMENT:   Ventilator    ASSESSMENT:   78yo male with hx of afib admitted post witness arrest, intubated in the field. Pt with another vfib arrest during transport to hospital. Pt started on TTM, possible COVID 19 (rule out)   4/10 Admit, intubated, TTM 4/12- pt rewarmed 4/13- nimbex discontinued   COVID-19 test resulted as negative. Per ID, plan to keep pt on precautions due to ongoing fever and hypoxic failure.   Spoke with RN via phone. Pt tolerating Vital 1.5 @ 35 ml/hr + 60 ml Prostat TID. Pt initially doing well with weaning of levophed this am but it was subsequently re-started this afternoon. Plan to increase Vital 1.5 today (needs adjusted for fever) and transition to Osmolite 1.5 tomorrow in attempts to preserve formulas in national shortage.   Pt day 4 without BM. Will continue to monitor for results.   Weight noted to increase from 91.8 kg on 4/11 to 95.5 kg today. Will continue to monitor trends.   Patient is currently intubated on ventilator support MV: 13.4 L/min Temp (24hrs), Avg:100.1 F (37.8 C), Min:99.3 F (37.4 C), Max:100.9 F (38.3 C) BP: 111/49 MAP: 63 (A-line)  I/O: +6,004 ml since  admit UOP: 2,725 ml x 24 hrs   Drips: NS @ 10 ml/hr, versed, milrinone, levophed Medications: amiodarone, SS novolog  Labs: CBG 112-149   Diet Order:   Diet Order    None      EDUCATION NEEDS:   Not appropriate for education at this time  Skin:  Skin Assessment: Reviewed RN Assessment  Last BM:  PTA  Height:   Ht Readings from Last 1 Encounters:  06/13/18 5' 6"  (1.676 m)    Weight:   Wt Readings from Last 1 Encounters:  06/14/18 95.5 kg    Ideal Body Weight:  64.5 kg  BMI:  Body mass index is 33.98 kg/m.  Estimated Nutritional Needs:   Kcal:  2104 kcal  Protein:  130-160 grams  Fluid:  >/= 1.8 L/day   CMariana SingleRD, LDN Clinical Nutrition Pager # - [phone]

## 2018-06-14 NOTE — Progress Notes (Signed)
Patient was seen this morning, discussed with Joneen Roach, nurse practitioner  Vitals noted-T-max 100.4 Elderly gentleman does not appear to be in distress No response to painful stimuli S1-S2 appreciated Coarse breath sounds bilaterally Abdomen is soft bowel sounds appreciated Extremities shows no clubbing no edema  Hypoxemic respiratory failure Multifocal pneumonia On Zosyn -T-max 100.4, no leukocytosis -Progressive chest x-ray findings, more likely related to contusion  Status post V. fib cardiac arrest Cause is unclear  Hypoxic encephalopathy -Ongoing concern for significant neurological damage -Remains unresponsive off sedation  Atrial fibrillation -On anticoagulation  Novel coronavirus testing negative x2  Guarded prognosis

## 2018-06-14 NOTE — Progress Notes (Signed)
ANTICOAGULATION CONSULT NOTE   Pharmacy Consult for Heparin Indication: atrial fibrillation, possible PE  No Known Allergies  Patient Measurements: Height: 5\' 6"  (167.6 cm) Weight: 210 lb 8.6 oz (95.5 kg) IBW/kg (Calculated) : 63.8 Heparin Dosing Weight: 85.4  Vital Signs: Temp: 100.4 F (38 C) (04/15 1400) Temp Source: Bladder (04/15 1200) BP: 95/43 (04/15 1523) Pulse Rate: 100 (04/15 1523)  Labs: Recent Labs    06/12/18 1700  06/13/18 0509 06/13/18 1112 06/13/18 1652 06/14/18 0546 06/14/18 1402  HGB  --    < > 12.5* 11.9*  --  11.4*  --   HCT  --    < > 40.2 35.0*  --  36.0*  --   PLT  --   --  PLATELET CLUMPS NOTED ON SMEAR, COUNT APPEARS DECREASED  --   --  114*  --   LABPROT 25.5*  --  23.7*  --   --  20.3*  --   INR 2.4*  --  2.2*  --   --  1.8*  --   HEPARINUNFRC  --    < > <0.10*  --  <0.10* <0.10* <0.10*  CREATININE  --   --  1.73*  --   --  1.98*  --    < > = values in this interval not displayed.    Estimated Creatinine Clearance: 32.7 mL/min (A) (by C-G formula based on SCr of 1.98 mg/dL (H)).   Medical History: Past Medical History:  Diagnosis Date  . Atrial fibrillation ([name])   . Chronic back pain   . CKD (chronic kidney disease), stage II   . Hypertension   . Nephrolithiasis   . Stroke [facility])    right cerebellar infarct  . TIA (transient ischemic attack)     Medications:  Cardizem  Flomax  Coumadin 5 mg daily except 7.5 mg Fridays  Assessment: 78 y.o. male admitted s/p cardiac arrest, now with ROSC and placed on hypothermia protocol, h/o Afib and Coumadin on hold, for heparin.  Per most recent Coumadin Clinic note, INR goal was 2.5-3.5. INR  increased to >9 and received IV Vit K on 4/12  Heparin level <0.10, no pauses or problems with line per nurse.    Goal of Therapy:  INR 2.5-3.5 Heparin level 0.3-0.7 units/ml Monitor platelets by anticoagulation protocol: Yes   Plan:  Bolus heparin 2500 units IV Increase heparin drip  1800 units/hr  Check 8hr level Daily INR, HL, CBC  Ewing Schlein, PharmD PGY1 Pharmacy Resident 06/14/2018    3:55 PM Please check AMION for all [facility] Pharmacy numbers

## 2018-06-14 NOTE — Progress Notes (Signed)
Called [facility] for referral. Representative noted a call had been placed. Referral #[id number]. Please call back with TOD.

## 2018-06-14 NOTE — Progress Notes (Signed)
Spoke w RN- holding off on EEG due to family meeting Dr. Later in the day. Will check back to see status/need of EEG at that time.

## 2018-06-15 ENCOUNTER — Other Ambulatory Visit: Payer: Self-pay

## 2018-06-15 DIAGNOSIS — J181 Lobar pneumonia, unspecified organism: Secondary | ICD-10-CM | POA: Diagnosis present

## 2018-06-15 DIAGNOSIS — I4891 Unspecified atrial fibrillation: Secondary | ICD-10-CM | POA: Diagnosis present

## 2018-06-15 DIAGNOSIS — I6789 Other cerebrovascular disease: Secondary | ICD-10-CM

## 2018-06-15 DIAGNOSIS — N182 Chronic kidney disease, stage 2 (mild): Secondary | ICD-10-CM | POA: Diagnosis present

## 2018-06-15 DIAGNOSIS — I4819 Other persistent atrial fibrillation: Secondary | ICD-10-CM

## 2018-06-15 DIAGNOSIS — J9601 Acute respiratory failure with hypoxia: Secondary | ICD-10-CM | POA: Diagnosis present

## 2018-06-15 DIAGNOSIS — I639 Cerebral infarction, unspecified: Secondary | ICD-10-CM | POA: Diagnosis present

## 2018-06-15 DIAGNOSIS — G931 Anoxic brain damage, not elsewhere classified: Secondary | ICD-10-CM | POA: Diagnosis present

## 2018-06-15 DIAGNOSIS — R0902 Hypoxemia: Secondary | ICD-10-CM

## 2018-06-15 LAB — BASIC METABOLIC PANEL
Anion gap: 11 (ref 5–15)
BUN: 64 mg/dL — ABNORMAL HIGH (ref 8–23)
CO2: 24 mmol/L (ref 22–32)
Calcium: 8.4 mg/dL — ABNORMAL LOW (ref 8.9–10.3)
Chloride: 107 mmol/L (ref 98–111)
Creatinine, Ser: 1.91 mg/dL — ABNORMAL HIGH (ref 0.61–1.24)
GFR calc Af Amer: 38 mL/min — ABNORMAL LOW (ref >60)
GFR calc non Af Amer: 33 mL/min — ABNORMAL LOW (ref >60)
Glucose, Bld: 153 mg/dL — ABNORMAL HIGH (ref 70–99)
Potassium: 3.6 mmol/L (ref 3.5–5.1)
Sodium: 142 mmol/L (ref 135–145)

## 2018-06-15 LAB — BLOOD CULTURE ID PANEL (REFLEXED)

## 2018-06-15 LAB — CBC WITH DIFFERENTIAL/PLATELET
Abs Immature Granulocytes: 0.14 10*3/uL — ABNORMAL HIGH (ref 0.00–0.07)
Basophils Absolute: 0 10*3/uL (ref 0.0–0.1)
Basophils Relative: 0 %
Eosinophils Absolute: 0 10*3/uL (ref 0.0–0.5)
Eosinophils Relative: 0 %
HCT: 33.4 % — ABNORMAL LOW (ref 39.0–52.0)
Hemoglobin: 11.2 g/dL — ABNORMAL LOW (ref 13.0–17.0)
Immature Granulocytes: 2 %
Lymphocytes Relative: 8 %
Lymphs Abs: 0.7 10*3/uL (ref 0.7–4.0)
MCH: 33.1 pg (ref 26.0–34.0)
MCHC: 33.5 g/dL (ref 30.0–36.0)
MCV: 98.8 fL (ref 80.0–100.0)
Monocytes Absolute: 1.1 10*3/uL — ABNORMAL HIGH (ref 0.1–1.0)
Monocytes Relative: 12 %
Neutro Abs: 6.7 10*3/uL (ref 1.7–7.7)
Neutrophils Relative %: 78 %
Platelets: 125 10*3/uL — ABNORMAL LOW (ref 150–400)
RBC: 3.38 MIL/uL — ABNORMAL LOW (ref 4.22–5.81)
RDW: 14.7 % (ref 11.5–15.5)
WBC: 8.6 10*3/uL (ref 4.0–10.5)
nRBC: 0 % (ref 0.0–0.2)

## 2018-06-15 LAB — GLUCOSE, CAPILLARY
Glucose-Capillary: 156 mg/dL — ABNORMAL HIGH (ref 70–99)
Glucose-Capillary: 134 mg/dL — ABNORMAL HIGH (ref 70–99)
Glucose-Capillary: 130 mg/dL — ABNORMAL HIGH (ref 70–99)
Glucose-Capillary: 137 mg/dL — ABNORMAL HIGH (ref 70–99)
Glucose-Capillary: 136 mg/dL — ABNORMAL HIGH (ref 70–99)
Glucose-Capillary: 181 mg/dL — ABNORMAL HIGH (ref 70–99)
Glucose-Capillary: 156 mg/dL — ABNORMAL HIGH (ref 70–99)

## 2018-06-15 LAB — CULTURE, RESPIRATORY W GRAM STAIN

## 2018-06-15 LAB — PROTIME-INR
INR: 1.7 — ABNORMAL HIGH (ref 0.8–1.2)
Prothrombin Time: 19.6 seconds — ABNORMAL HIGH (ref 11.4–15.2)

## 2018-06-15 LAB — HEPARIN LEVEL (UNFRACTIONATED)
Heparin Unfractionated: 0.13 IU/mL — ABNORMAL LOW (ref 0.30–0.70)
Heparin Unfractionated: 0.38 IU/mL (ref 0.30–0.70)
Heparin Unfractionated: 0.35 IU/mL (ref 0.30–0.70)

## 2018-06-15 LAB — COOXEMETRY PANEL
Carboxyhemoglobin: 1.4 % (ref 0.5–1.5)
Methemoglobin: 0.8 % (ref 0.0–1.5)
O2 Saturation: 66.4 %
Total hemoglobin: 14.8 g/dL (ref 12.0–16.0)

## 2018-06-15 LAB — CULTURE, RESPIRATORY: Culture: NORMAL

## 2018-06-15 LAB — MAGNESIUM: Magnesium: 2 mg/dL (ref 1.7–2.4)

## 2018-06-15 MED ORDER — FUROSEMIDE 10 MG/ML IJ SOLN
40.0000 mg | Freq: Once | INTRAMUSCULAR | Status: AC
  Administered 2018-06-15: 40 mg via INTRAVENOUS
  Filled 2018-06-15: qty 4

## 2018-06-15 MED ORDER — FENTANYL CITRATE (PF) 100 MCG/2ML IJ SOLN: 25.0000 ug | INTRAMUSCULAR | Status: DC | PRN

## 2018-06-15 MED ORDER — FENTANYL CITRATE (PF) 100 MCG/2ML IJ SOLN
25.0000 ug | INTRAMUSCULAR | Status: DC | PRN
  Administered 2018-06-15 – 2018-06-17 (×7): 25 ug via INTRAVENOUS
  Filled 2018-06-15 (×7): qty 2

## 2018-06-15 MED ORDER — LABETALOL HCL 5 MG/ML IV SOLN
20.0000 mg | INTRAVENOUS | Status: DC | PRN
  Administered 2018-06-15 – 2018-06-17 (×5): 20 mg via INTRAVENOUS
  Filled 2018-06-15 (×5): qty 4

## 2018-06-15 MED ORDER — MORPHINE SULFATE 10 MG/5ML PO SOLN: 5.0000 mg | ORAL | Status: DC | PRN

## 2018-06-15 MED ORDER — HYDRALAZINE HCL 20 MG/ML IJ SOLN
5.0000 mg | Freq: Four times a day (QID) | INTRAMUSCULAR | Status: DC | PRN
  Filled 2018-06-15: qty 1

## 2018-06-15 MED ORDER — MORPHINE SULFATE 10 MG/5ML PO SOLN
10.0000 mg | ORAL | Status: DC | PRN
  Administered 2018-06-15: 10 mg
  Filled 2018-06-15 (×3): qty 5

## 2018-06-15 MED ORDER — OSMOLITE 1.5 CAL PO LIQD
1000.0000 mL | ORAL | Status: DC
  Filled 2018-06-15 (×4): qty 1000

## 2018-06-15 MED ORDER — HEPARIN BOLUS VIA INFUSION
2500.0000 [IU] | Freq: Once | INTRAVENOUS | Status: AC
  Administered 2018-06-15: 2500 [IU] via INTRAVENOUS
  Filled 2018-06-15: qty 2500

## 2018-06-15 MED ORDER — MORPHINE SULFATE (CONCENTRATE) 10 MG/0.5ML PO SOLN
5.0000 mg | ORAL | Status: DC | PRN
  Administered 2018-06-15 – 2018-06-17 (×2): 10 mg
  Administered 2018-06-17: 5 mg
  Filled 2018-06-15 (×3): qty 0.5

## 2018-06-15 NOTE — Progress Notes (Addendum)
NAME:  Ross Lewis, MRN:  [id number], DOB:  [birth year] 2018/07/01, LOS: 6 ADMISSION DATE:  07/01/18, CONSULTATION DATE: 4/10/ 2020 REFERRING MD: Vallery Ridge, CHIEF COMPLAINT: Status post cardiac arrest  HPI/course in hospital  78 year old man with limited past medical history. Suffered a witnessed cardiac arrest at home.  CPR initiated after 3 minutes.  Defibrillated from ventricular fibrillation.  Spontaneous circulation restored after 15 minutes. Placed in isolation for reported COVID-19 contact. Started on TTM.  +++ Name is incorrect: Correct name is Ross Lewis 12/07/1940 +++   Past Medical History    . Atrial fibrillation ([name]) followed by Dr. Rennis Golden on warfarin.   . Chronic back pain   . CKD (chronic kidney disease), stage II   . Hypertension   . Nephrolithiasis   . Stroke [facility]) followed by Dr. Pearlean Brownie     right cerebellar infarct  . TIA (transient ischemic attack) last in March    Hospital course  4/16: Off from neuromuscular blockade, off from IV sedating drips. Since 4/15 Still desaturates. Remains unresponsive even to noxious stimuli.  Spoke with infectious disease.  They are recommending ongoing respiratory isolation in spite of to negative COVID tests given on-going fever Tests/diagnostics  A bedside point-of-care echocardiogram 4/11 which showed normal LV size with hyperdynamic function.  RV dilatation with moderate dysfunction.  No significant valvular abnormalities noted.  No pericardial effusion  Interim history/subjective:  He remains unresponsive.  Still spiking fever.  White blood cell count normal.  Objective   Blood pressure 134/60, pulse 93, temperature (Abnormal) 101.1 F (38.4 C), temperature source Bladder, resp. rate 17, height 5\' 6"  (1.676 m), weight 93.7 kg, SpO2 97 %. CVP:  [6 mmHg-12 mmHg] 6 mmHg  Vent Mode: PRVC FiO2 (%):  [90 %-100 %] 100 % Set Rate:  [24 bmp] 24 bmp Vt Set:  [510 mL] 510 mL PEEP:  [12 cmH20] 12 cmH20  Plateau Pressure:  [22 cmH20-25 cmH20] 25 cmH20   Intake/Output Summary (Last 24 hours) at 06/15/2018 0758 Last data filed at 06/15/2018 0600 Gross per 24 hour  Intake 1206.07 ml  Output 3100 ml  Net -1893.93 ml   Filed Weights   06/13/18 0500 06/14/18 0500 06/15/18 0123  Weight: 98.6 kg 95.5 kg 93.7 kg   General: 78 year old white male currently unresponsive on full ventilatory support HEENT normocephalic atraumatic pupils are pinpoint, mucous membranes are moist he is orally intubated Pulmonary: Clear to auscultation with occasional diaphragmatic spasm diminished bases Cardiac: Atrial fibrillation with controlled ventricular rate Abdomen: Soft, not tender.  Appears to be tolerating tube feeds Extremities: Warm and dry, dependent edema/anasarca.  Strong pulses Neuro: Unresponsive.  No response to noxious stimulus GU: Clear yellow    Assessment & Plan:   S/p VF cardiac arrest Plan Cont tele Will need formal echocardiogram once we can clear COVID 19 Given what seems to be severe anoxic brain injury I am not sure he will be a candidate for further cardiac evaluation  Acute hypoxemic respiratory failure s/p cardiac arrest  Favor aspiration PNA over COVID (has had 2 negative tests) + possible pulm contusions Portable chest x-ray personally reviewed from 4/14: This demonstrates endotracheal tube in satisfactory position as is the right internal jugular vein triple-lumen catheter NG tube courses below the level of the diaphragm.  There is no cardiomegaly.  There was dense consolidation of the right middle lobe and bilateral worsening patchy airspace disease Oxygen/PEEP requirements: He still desaturates with positional changes, he would benefit from hypertonic therapy unfortunately with  COVID restrictions we cannot use aerosolized therapies plan Cont full vent support Day # 3 zosyn F/u culture data AM CXR Spoke w/ ID. They are recommending on-going isolation as still spiking  fevers.  IV Lasix  R/o COVID -19 -has had two neg test via nasal swab BUT still having fevers. Unfortunately...LFTs and ddimer unlikely to be helpful. It does seem as though we have alternative explaination for his fever: pulm contusions, anoxic injury, aspiration Plan Cont isolation Watch fever curve. Would need 72 hrs w/out fever and no administration of antipyretics before we could re-consider dc isolation  Anoxic brain injury s/p cardiac arrest -completed TTM Plan MRI once COVID isolation discontinued EEG would be helpful but likely still necessitate MRI for confirmatory test  AF w/ CVR Plan Continue telemetry Continue amiodarone and IV heparin  CRI Creatinine has been stable Plan Renal dose medications Strict intake output  Diabetes with hyperglycemia Plan Sliding scale insulin  Mild coagulopathy, warfarin induced Stable Plan Trending INR Holding warfarin while on heparin  Best practice:  Diet: No enteral access.  Pain/Anxiety/Delirium protocol (if indicated): Off from neuromuscular blockade and IV sedating infusions VAP protocol (if indicated): Bundle in place DVT prophylaxis: Therapeutic heparin IV GI prophylaxis: Pantoprazole Glucose control: Sliding scale insulin  mobility: Bedrest Code Status: Full code Family Communication: Spoke to patient's wife Meriam SpragueBeverly who is a [employer]ographer[employer] mental health nurse.  She denies any credible COVID-19 exposure. We will speak with primary service re: updating spouse  Simonne Martineteter E Babcock ACNP-BC [facility] Pager # [phone] OR # [phone] if no answer   06/15/2018, 7:58 AM    Attending note: I have seen and examined the patient. History, labs and imaging reviewed.  Continues to spike fevers.  Remains on the ventilator with poor mental status  Blood pressure (!) 144/60, pulse 90, temperature (!) 100.8 F (38.2 C), resp. rate (!) 27, height 5\' 6"  (1.676 m), weight 93.7 kg, SpO2 97 %. Gen:      Chronically  ill-appearing HEENT:  EOMI, sclera anicteric, ET tube Neck:     No masses; no thyromegaly Lungs:    Clear to auscultation bilaterally; normal respiratory effort CV:         Regular rate and rhythm; no murmurs Abd:      + bowel sounds; soft, non-tender; no palpable masses, no distension Ext:    No edema; adequate peripheral perfusion Skin:      Warm and dry; no rash Neuro: Unresponsive  Labs reviewed, significant for Sodium 142, potassium 3.6, BUN/creatinine 64/1.91 WBC 8.6, hemoglobin 11.2, platelets 125  Imaging No new imaging  Assessment/plan: 78 year old with V. fib cardiac arrest, likely anoxic brain injury  Acute respiratory failure Likely has aspiration, HCAP with enterobacter in sputum Continues under COVID isolation precautions as he still spiking fevers per ID Continue vent support. Wean PEEP, Fio2 as tolerated On Zosyn  Cardiac arrest Concern for anoxic brain injury Will need echo, EEG, MRI once off isolation precaution  The patient is critically ill with multiple organ systems failure and requires high complexity decision making for assessment and support, frequent evaluation and titration of therapies, application of advanced monitoring technologies and extensive interpretation of multiple databases.  Critical care time - 35 mins. This represents my time independent of the NPs time taking care of the pt.  Chilton GreathousePraveen Kitti Mcclish MD [place] [facility] 06/15/2018, 10:59 AM

## 2018-06-15 NOTE — Progress Notes (Signed)
PHARMACY - PHYSICIAN COMMUNICATION CRITICAL VALUE ALERT - BLOOD CULTURE IDENTIFICATION (BCID)  Ross Lewis is an 78 y.o. male who presented to [facility] on 2018/06/15 with a chief complaint of cardiac arrest  Assessment:  1/2 BC with Staph, no Mec A detected. ? contaminant  Name of physician (or Provider) Contacted: K Schorr  Current antibiotics: zosyn  Changes to prescribed antibiotics recommended:  No changes for now  Results for orders placed or performed during the hospital encounter of 15-Jun-2018  Blood Culture ID Panel (Reflexed) (Collected: 06/13/2018 11:40 PM)  Result Value Ref Range   Enterococcus species NOT DETECTED NOT DETECTED   Listeria monocytogenes NOT DETECTED NOT DETECTED   Staphylococcus species DETECTED (A) NOT DETECTED   Staphylococcus aureus (BCID) NOT DETECTED NOT DETECTED   Methicillin resistance NOT DETECTED NOT DETECTED   Streptococcus species NOT DETECTED NOT DETECTED   Streptococcus agalactiae NOT DETECTED NOT DETECTED   Streptococcus pneumoniae NOT DETECTED NOT DETECTED   Streptococcus pyogenes NOT DETECTED NOT DETECTED   Acinetobacter baumannii NOT DETECTED NOT DETECTED   Enterobacteriaceae species NOT DETECTED NOT DETECTED   Enterobacter cloacae complex NOT DETECTED NOT DETECTED   Escherichia coli NOT DETECTED NOT DETECTED   Klebsiella oxytoca NOT DETECTED NOT DETECTED   Klebsiella pneumoniae NOT DETECTED NOT DETECTED   Proteus species NOT DETECTED NOT DETECTED   Serratia marcescens NOT DETECTED NOT DETECTED   Haemophilus influenzae NOT DETECTED NOT DETECTED   Neisseria meningitidis NOT DETECTED NOT DETECTED   Pseudomonas aeruginosa NOT DETECTED NOT DETECTED   Candida albicans NOT DETECTED NOT DETECTED   Candida glabrata NOT DETECTED NOT DETECTED   Candida krusei NOT DETECTED NOT DETECTED   Candida parapsilosis NOT DETECTED NOT DETECTED   Candida tropicalis NOT DETECTED NOT DETECTED    Ross Lewis 06/15/2018  12:58 AM

## 2018-06-15 NOTE — Progress Notes (Signed)
1 mL of morphine wasted with Norva Pavlov RN.

## 2018-06-15 NOTE — Progress Notes (Signed)
ANTICOAGULATION CONSULT NOTE   Pharmacy Consult for Heparin Indication: atrial fibrillation, possible PE  No Known Allergies  Patient Measurements: Height: 5\' 6"  (167.6 cm) Weight: 206 lb 9.1 oz (93.7 kg) IBW/kg (Calculated) : 63.8 Heparin Dosing Weight: 85.4  Vital Signs: Temp: 99.3 F (37.4 C) (04/16 1100) Temp Source: Bladder (04/16 0730) BP: 154/67 (04/16 1100) Pulse Rate: 92 (04/16 1100)  Labs: Recent Labs    06/13/18 0509 06/13/18 1112  06/14/18 0546 06/14/18 1402 06/15/18 0000 06/15/18 0532 06/15/18 1015  HGB 12.5* 11.9*  --  11.4*  --   --  11.2*  --   HCT 40.2 35.0*  --  36.0*  --   --  33.4*  --   PLT PLATELET CLUMPS NOTED ON SMEAR, COUNT APPEARS DECREASED  --   --  114*  --   --  125*  --   LABPROT 23.7*  --   --  20.3*  --   --  19.6*  --   INR 2.2*  --   --  1.8*  --   --  1.7*  --   HEPARINUNFRC <0.10*  --    < > <0.10* <0.10* 0.13*  --  0.38  CREATININE 1.73*  --   --  1.98*  --   --  1.91*  --    < > = values in this interval not displayed.    Estimated Creatinine Clearance: 33.6 mL/min (A) (by C-G formula based on SCr of 1.91 mg/dL (H)).   Medical History: Past Medical History:  Diagnosis Date  . Atrial fibrillation ([name])   . Chronic back pain   . CKD (chronic kidney disease), stage II   . Hypertension   . Nephrolithiasis   . Stroke [facility])    right cerebellar infarct  . TIA (transient ischemic attack)     Medications:  Cardizem  Flomax  Coumadin 5 mg daily except 7.5 mg Fridays  Assessment: 78 y.o. male admitted s/p cardiac arrest, now with ROSC and placed on hypothermia protocol, h/o Afib and Coumadin on hold, for heparin.  Per most recent Coumadin Clinic note, INR goal was 2.5-3.5. INR  increased to >9 and received IV Vit K on 4/12  Heparin level 0.38 units/ml, no problems with line per nursing.   Goal of Therapy:  INR 2.5-3.5 Heparin level 0.3-0.7 units/ml Monitor platelets by anticoagulation protocol: Yes   Plan:   Continue heparin drip 2200 units/hr  8 hour confirmatory level Daily heparin level and CBC  Thanks for allowing pharmacy to be a part of this patient's care.  Ewing Schlein, PharmD PGY1 Pharmacy Resident 06/15/2018    11:31 AM Please check AMION for all [facility] Pharmacy numbers

## 2018-06-15 NOTE — Progress Notes (Signed)
Still awaiting cooling blanket. Pt is "on the list" awaiting available cooling blanket. Pt packed with ice packs at the moment, special measures made to make sure to protect patients skin integrity. Pt current temp. 38.9 degrees C. Will continue to monitor closely.   Delories Heinz, RN

## 2018-06-15 NOTE — Progress Notes (Signed)
ANTICOAGULATION CONSULT NOTE   Pharmacy Consult for Heparin Indication: atrial fibrillation, possible PE  No Known Allergies  Patient Measurements: Height: 5\' 6"  (167.6 cm) Weight: 206 lb 9.1 oz (93.7 kg) IBW/kg (Calculated) : 63.8 Heparin Dosing Weight: 85.4  Vital Signs: Temp: 101.1 F (38.4 C) (04/16 0200) Temp Source: Bladder (04/16 0000) BP: 129/55 (04/16 0200) Pulse Rate: 99 (04/16 0200)  Labs: Recent Labs    06/12/18 1700  06/13/18 0509 06/13/18 1112  06/14/18 0546 06/14/18 1402 06/15/18 0000  HGB  --    < > 12.5* 11.9*  --  11.4*  --   --   HCT  --    < > 40.2 35.0*  --  36.0*  --   --   PLT  --   --  PLATELET CLUMPS NOTED ON SMEAR, COUNT APPEARS DECREASED  --   --  114*  --   --   LABPROT 25.5*  --  23.7*  --   --  20.3*  --   --   INR 2.4*  --  2.2*  --   --  1.8*  --   --   HEPARINUNFRC  --   --  <0.10*  --    < > <0.10* <0.10* 0.13*  CREATININE  --   --  1.73*  --   --  1.98*  --   --    < > = values in this interval not displayed.    Estimated Creatinine Clearance: 32.4 mL/min (A) (by C-G formula based on SCr of 1.98 mg/dL (H)).   Medical History: Past Medical History:  Diagnosis Date  . Atrial fibrillation ([name])   . Chronic back pain   . CKD (chronic kidney disease), stage II   . Hypertension   . Nephrolithiasis   . Stroke [facility])    right cerebellar infarct  . TIA (transient ischemic attack)     Medications:  Cardizem  Flomax  Coumadin 5 mg daily except 7.5 mg Fridays  Assessment: 78 y.o. male admitted s/p cardiac arrest, now with ROSC and placed on hypothermia protocol, h/o Afib and Coumadin on hold, for heparin.  Per most recent Coumadin Clinic note, INR goal was 2.5-3.5. INR  increased to >9 and received IV Vit K on 4/12  Heparin level 0.13 units/ml    Goal of Therapy:  INR 2.5-3.5 Heparin level 0.3-0.7 units/ml Monitor platelets by anticoagulation protocol: Yes   Plan:  Bolus heparin 2500 units IV Increase heparin drip  2200 units/hr  Check heparin level in 6-8 hours Daily INR, HL, CBC  Thanks for allowing pharmacy to be a part of this patient's care.  Talbert Cage, PharmD Clinical Pharmacist

## 2018-06-15 NOTE — Progress Notes (Signed)
PROGRESS NOTE    Ross Lewis  WEX:[id number] DOB: 12/28/1940 DOA: 06/05/2018 PCP: System, Pcp Not In   Brief Narrative:  78 yo male PMHx RIGHT cerebellar CVA/TIA, atrial fibrillation on warfarin, HTN, CKD stage II, nephrolithiasis.   Afib with witness arrest cpr initiated by fire dept.  Pt was intubated with king airway and with initial rhythm vfib with defib and ROSC within 15 minutes.  Another vfib arrest in transport with defib shock and immediate ROSC.  Fire dept notes patient had know COVID contact.       Subjective: 4/16 unresponsive      Assessment & Plan:   Active Problems:   Cardiac arrest ([name])   Cardiogenic shock ([name])   Acute respiratory failure with hypoxia ([name])   Ischemic encephalopathy   Unspecified atrial fibrillation ([name])   CKD (chronic kidney disease), stage II   CVA (cerebral vascular accident) ([name])   Anoxic brain injury ([name])   Lobar pneumonia ([name])   Ventilator asynchrony -Discontinued  paralytics on 4/15 and effort to determine if.  Patient would wake up.  Patient still unresponsive  Acute respiratory failure with hypoxia - Continue full ventilator support.  Patient's requirements increasing -We will continue to make adjustments in order to maximize recruitment of lungs - Currently patient poor outcome expected - On admission patient was on targeted temperature management (TTM)  Ischemic hypoxic encephalopathy/Anoxic brain injury? -Patient off paralytics/sedating medication for~36 hours and no change in mental status i.e. still unresponsive. - If patient survives and we can clear from [place] would obtain MRI and possibly EEG  S/p ventricular fibrillation cardiac arrest x2/cardiogenic shock - Per H&P patient now for 20 minutes -Unfortunately patient's because of arrest unclear as minimal diagnostic testing allow until patient cleared of COVID.   Atrial fibrillation -Amiodarone protocol.  Target SBP 160-190 -Continue heparin drip -  Hydralazine PRN  Multi lobar pneumonia vs COVID - Patient's CXR would be consistent with COVID, along with continuing elevated fever however SARS coronavirus negative, COVID- 19 negative - Continued spiking fevers could be secondary to resistant bacterial infection, anoxic brain injury, PE.   -Discussed case with Dr. [name], ID and our need to proceed with further diagnostic testing.  Instructed that patient's presumptive COVID infection diagnosis would have to stand unless we obtained a BAL or tracheal aspirate and they were negative. -Tracheal aspirate sent for SARS coronavirus evaluation per ID -Also discussed case with NP [name] from [facility] and he informed me that ID would be having a multidisciplinary meeting this afternoon to discuss this case.  -Continue current antibiotics until 4/17 at which point ID should have made decision.  Goals of care - If ID concurs that patient is COVID negative will obtain CTA PE protocol, MRI brain, EEG.        Best practice:  Diet: No enteral access.  Pain/Anxiety/Delirium protocol (if indicated): Sedation while on NMB VAP protocol (if indicated): Bundle in place DVT prophylaxis: Therapeutic heparin IV GI prophylaxis: Pantoprazole Urinary catheter: Guide hemodynamic management Glucose control: Phase 1 basal bolus insulin Mobility: Bedrest Code Status: Full code Family Communication: Spoke to patient's wife [name] who is a Dispensing optician.  She denies any credible COVID-19 exposure. I did update patient's spouse at 29 06/13/2018 -She would like to be contacted if anything significant changes on him -She was quite particular about not wanting him to pass alone Disposition: ICU    DVT prophylaxis: Therapeutic heparin Code Status: Full Family Communication: Wife Disposition Plan: TBD  Consultants:  ID    Procedures/Significant Events:  4/10 ETT  4/10 Vfib arrest x2 defib 4/10 hypothermia protocol  4/10 LEFT  arterial line 4/11 RIGHT IJ    I have personally reviewed and interpreted all radiology studies and my findings are as above.  VENTILATOR SETTINGS: Mode: PRVC VtSet: 540 Set rate: 24 FiO2 90% PEEP 12       Cultures 4/11 COVID-19 negative 4/15 SARS coronavirus negative 4/16 SARS Coronavirus Trach aspirate Pending    Antimicrobials: Anti-infectives (From admission, onward)   Start     Stop   06/13/18 2315  piperacillin-tazobactam (ZOSYN) IVPB 3.375 g             Devices    LINES / TUBES:  LEFT arterial line 4/10>> ETT 4/10>> RIGHT IJ 4/11>>    Continuous Infusions: . sodium chloride Stopped (06/15/18 1047)  . feeding supplement (OSMOLITE 1.5 CAL)    . heparin 2,200 Units/hr (06/15/18 1605)  . norepinephrine (LEVOPHED) Adult infusion Stopped (06/14/18 1740)  . piperacillin-tazobactam (ZOSYN)  IV 3.375 g (06/15/18 1351)     Objective: Vitals:   06/15/18 1514 06/15/18 1516 06/15/18 1600 06/15/18 1636  BP: (S) 136/63  (!) 143/63   Pulse: 88 83 88 88  Resp: (!) 30 (!) 32 (!) 29 (!) 32  Temp: (!) 101.3 F (38.5 C) (!) 101.3 F (38.5 C) (!) 101.7 F (38.7 C) (!) 102 F (38.9 C)  TempSrc:   Bladder   SpO2: 93% 93% 98% 98%  Weight:      Height:        Intake/Output Summary (Last 24 hours) at 06/15/2018 1720 Last data filed at 06/15/2018 1600 Gross per 24 hour  Intake 1154.4 ml  Output 2950 ml  Net -1795.6 ml   Filed Weights   06/13/18 0500 06/14/18 0500 06/15/18 0123  Weight: 98.6 kg 95.5 kg 93.7 kg    Examination:  General: Unresponsive positive  acute respiratory distress Eyes: negative scleral hemorrhage, negative anisocoria, negative icterus ENT: Negative Runny nose, negative gingival bleeding, Neck:  Negative scars, masses, torticollis, lymphadenopathy, JVD #7.5 cuffed trach in place thick yellowish sputum suctioned during the day. Lungs: Diffuse poor air movement, without wheezes or crackles, tachypneic Cardiovascular: Tachycardic,  without murmur gallop or rub normal S1 and S2 Abdomen: negative abdominal pain, nondistended, positive soft, bowel sounds, no rebound, no ascites, no appreciable mass Extremities: No significant cyanosis, clubbing, or edema bilateral lower extremities Skin: Negative rashes, lesions, ulcers Psychiatric: Unresponsive therefore unable to evaluate  Central nervous system: Unresponsive therefore unable to evaluate  .     Data Reviewed: Care during the described time interval was provided by me .  I have reviewed this patient's available data, including medical history, events of note, physical examination, and all test results as part of my evaluation.   CBC: Recent Labs  Lab 06/23/2018 2054  06/10/18 1800  06/11/18 0405  06/13/18 0021 06/13/18 0509 06/13/18 1112 06/14/18 0546 06/15/18 0532  WBC 11.9*   < > 8.9  --  6.6  --   --  7.8  --  8.6 8.6  NEUTROABS 6.8  --   --   --   --   --   --  6.8  --  7.0 6.7  HGB 14.0   < > 14.3   < > 14.7   < > 12.6* 12.5* 11.9* 11.4* 11.2*  HCT 44.9   < > 43.5   < > 42.5   < > 37.0* 40.2 35.0* 36.0*  33.4*  MCV 102.5*   < > 96.7  --  94.9  --   --  101.5*  --  101.1* 98.8  PLT 144*   < > 120*  --  137*  --   --  PLATELET CLUMPS NOTED ON SMEAR, COUNT APPEARS DECREASED  --  114* 125*   < > = values in this interval not displayed.   Basic Metabolic Panel: Recent Labs  Lab 06/10/18 0525  06/11/18 0405  06/11/18 0827  06/11/18 1214  06/11/18 2131 06/12/18 0203  06/12/18 1700  06/13/18 0021 06/13/18 0509 06/13/18 1112 06/14/18 0546 06/15/18 0532  NA 140   < > 138   < > 141   < > 141   < >  --   --    < >  --    < > 141 140 140 140 142  K 3.2*   < > 3.9   < > 4.0   < > 4.0   < >  --   --    < >  --    < > 4.1 4.5 4.1 3.6 3.6  CL 110  --  109  --   --   --   --   --   --   --   --   --   --   --  109  --  106 107  CO2 15*  --  19*  --   --   --   --   --   --   --   --   --   --   --  21*  --  26 24  GLUCOSE 278*   < > 140*  --  96  --  102*  --    --   --   --   --   --   --  118*  --  132* 153*  BUN 15  --  19  --   --   --   --   --   --   --   --   --   --   --  45*  --  53* 64*  CREATININE 1.57*  --  1.25*  --   --   --   --   --   --   --   --   --   --   --  1.73*  --  1.98* 1.91*  CALCIUM 8.1*  --  8.0*  --   --   --   --   --   --   --   --   --   --   --  7.7*  --  8.3* 8.4*  MG  --    < >  --   --   --   --   --   --  1.7 1.7  --  1.8  --   --  1.5*  --   --  2.0  PHOS  --   --   --   --   --   --   --   --  3.8 4.0  --  3.0  --   --  2.6  --   --   --    < > = values in this interval not displayed.   GFR: Estimated Creatinine Clearance: 33.6 mL/min (A) (by C-G formula based on SCr of 1.91 mg/dL (H)). Liver Function Tests: Recent Labs  Lab  06/11/2018 2054 06/10/18 1800  AST 162* 88*  ALT 126* 104*  ALKPHOS 62 32*  BILITOT 0.9 0.7  PROT 5.3* 4.8*  ALBUMIN 3.0* 2.5*   Recent Labs  Lab 06/02/2018 2054  LIPASE 40   No results for input(s): AMMONIA in the last 168 hours. Coagulation Profile: Recent Labs  Lab 06/12/18 0203 06/12/18 1700 06/13/18 0509 06/14/18 0546 06/15/18 0532  INR 3.7* 2.4* 2.2* 1.8* 1.7*   Cardiac Enzymes: Recent Labs  Lab 06/10/18 0208 06/10/18 0525 06/10/18 0911 06/10/18 1409 06/10/18 1800  TROPONINI 0.73* 0.96* 1.39* 1.49* 1.55*   BNP (last 3 results) No results for input(s): PROBNP in the last 8760 hours. HbA1C: No results for input(s): HGBA1C in the last 72 hours. CBG: Recent Labs  Lab 06/15/18 0120 06/15/18 0518 06/15/18 0755 06/15/18 1127 06/15/18 1716  GLUCAP 134* 130* 137* 136* 181*   Lipid Profile: No results for input(s): CHOL, HDL, LDLCALC, TRIG, CHOLHDL, LDLDIRECT in the last 72 hours. Thyroid Function Tests: No results for input(s): TSH, T4TOTAL, FREET4, T3FREE, THYROIDAB in the last 72 hours. Anemia Panel: No results for input(s): VITAMINB12, FOLATE, FERRITIN, TIBC, IRON, RETICCTPCT in the last 72 hours. Urine analysis: No results found for: COLORURINE,  APPEARANCEUR, [place], [place], GLUCOSEU, HGBUR, BILIRUBINUR, KETONESUR, PROTEINUR, UROBILINOGEN, NITRITE, LEUKOCYTESUR Sepsis Labs: _0 (procalcitonin:4,lacticidven:4)  ) Recent Results (from the past 240 hour(s))  Novel Coronavirus, NAA (hospital order; send-out to ref lab)     Status: None   Collection Time: 06/10/18 12:09 AM  Result Value Ref Range Status   SARS-CoV-2, NAA NOT DETECTED NOT DETECTED Final    Comment: Performed at [facility]  Final    Comment: Performed at [facility], [place] [address] [place], [place] [address]  MRSA PCR Screening     Status: None   Collection Time: 06/10/18 12:09 AM  Result Value Ref Range Status   MRSA by PCR NEGATIVE NEGATIVE Final    Comment:        The GeneXpert MRSA Assay (FDA approved for NASAL specimens only), is one component of a comprehensive MRSA colonization surveillance program. It is not intended to diagnose MRSA infection nor to guide or monitor treatment for MRSA infections. Performed at [facility], [place] [address], [place], [place] [address]   Culture, respiratory (non-expectorated)     Status: None   Collection Time: 06/13/18  6:45 AM  Result Value Ref Range Status   Specimen Description TRACHEAL ASPIRATE  Final   Special Requests NONE  Final   Gram Stain   Final    RARE WBC PRESENT,BOTH PMN AND MONONUCLEAR RARE GRAM POSITIVE COCCI IN PAIRS    Culture   Final    FEW Consistent with normal respiratory flora. Performed at [facility], [place] [address], [place], [place] [address]    Report Status 06/15/2018 FINAL  Final  Culture, blood (Routine X 2) w Reflex to ID Panel     Status: None (Preliminary result)   Collection Time: 06/13/18 11:40 PM  Result Value Ref Range Status   Specimen Description BLOOD RIGHT HAND  Final   Special Requests   Final    BOTTLES DRAWN AEROBIC ONLY Blood Culture results may not be optimal due to an inadequate volume of  blood received in culture bottles   Culture  Setup Time   Final    AEROBIC BOTTLE ONLY GRAM POSITIVE COCCI CRITICAL RESULT CALLED TO, READ BACK BY AND VERIFIED WITH: L SEAY PHARMD  06/15/18 0050    Culture   Final    GRAM POSITIVE COCCI CULTURE REINCUBATED FOR BETTER GROWTH Performed at [facility], [place] [address] [place], [place] [address]    Report Status PENDING  Incomplete  Blood Culture ID Panel (Reflexed)     Status: Abnormal   Collection Time: 06/13/18 11:40 PM  Result Value Ref Range Status   Enterococcus species NOT DETECTED NOT DETECTED Final   Listeria monocytogenes NOT DETECTED NOT DETECTED Final   Staphylococcus species DETECTED (A) NOT DETECTED Final    Comment: Methicillin (oxacillin) susceptible coagulase negative staphylococcus. Possible blood culture contaminant (unless isolated from more than one blood culture draw or clinical case suggests pathogenicity). No antibiotic treatment is indicated for blood  culture contaminants. CRITICAL RESULT CALLED TO, READ BACK BY AND VERIFIED WITH: L SEAY PHARMD 06/15/18 0050 JDW    Staphylococcus aureus (BCID) NOT DETECTED NOT DETECTED Final   Methicillin resistance NOT DETECTED NOT DETECTED Final   Streptococcus species NOT DETECTED NOT DETECTED Final   Streptococcus agalactiae NOT DETECTED NOT DETECTED Final   Streptococcus pneumoniae NOT DETECTED NOT DETECTED Final   Streptococcus pyogenes NOT DETECTED NOT DETECTED Final   Acinetobacter baumannii NOT DETECTED NOT DETECTED Final   Enterobacteriaceae species NOT DETECTED NOT DETECTED Final   Enterobacter cloacae complex NOT DETECTED NOT DETECTED Final   Escherichia coli NOT DETECTED NOT DETECTED Final   Klebsiella oxytoca NOT DETECTED NOT DETECTED Final   Klebsiella pneumoniae NOT DETECTED NOT DETECTED Final   Proteus species NOT DETECTED NOT DETECTED Final   Serratia marcescens NOT DETECTED NOT DETECTED Final   Haemophilus influenzae NOT DETECTED NOT DETECTED Final    Neisseria meningitidis NOT DETECTED NOT DETECTED Final   Pseudomonas aeruginosa NOT DETECTED NOT DETECTED Final   Candida albicans NOT DETECTED NOT DETECTED Final   Candida glabrata NOT DETECTED NOT DETECTED Final   Candida krusei NOT DETECTED NOT DETECTED Final   Candida parapsilosis NOT DETECTED NOT DETECTED Final   Candida tropicalis NOT DETECTED NOT DETECTED Final    Comment: Performed at [facility], [place]. [address], [place], [place] [address]  Culture, blood (Routine X 2) w Reflex to ID Panel     Status: None (Preliminary result)   Collection Time: 06/13/18 11:45 PM  Result Value Ref Range Status   Specimen Description BLOOD RIGHT HAND  Final   Special Requests   Final    BOTTLES DRAWN AEROBIC ONLY Blood Culture results may not be optimal due to an inadequate volume of blood received in culture bottles   Culture   Final    NO GROWTH 1 DAY Performed at [facility], [place] [address] [place], [place] [address]    Report Status PENDING  Incomplete  Culture, respiratory (non-expectorated)     Status: None (Preliminary result)   Collection Time: 06/14/18  1:03 AM  Result Value Ref Range Status   Specimen Description TRACHEAL ASPIRATE  Final   Special Requests NONE  Final   Gram Stain   Final    ABUNDANT WBC PRESENT, PREDOMINANTLY PMN MODERATE GRAM NEGATIVE RODS MODERATE GRAM POSITIVE COCCI RARE YEAST    Culture   Final    MODERATE ENTEROBACTER SPECIES CULTURE REINCUBATED FOR BETTER GROWTH Performed at [facility], [place] [address] [place], [place] [address]    Report Status PENDING  Incomplete  SARS Coronavirus 2 [facility] order, Performed in [place] hospital lab)     Status: None   Collection Time:  06/14/18  2:02 PM  Result Value Ref Range Status   SARS Coronavirus 2 NEGATIVE NEGATIVE Final    Comment: (NOTE) If result is NEGATIVE SARS-CoV-2 target nucleic acids are NOT DETECTED. The SARS-CoV-2 RNA is generally detectable in upper and lower   respiratory specimens during the acute phase of infection. The lowest  concentration of SARS-CoV-2 viral copies this assay can detect is 250  copies / mL. A negative result does not preclude SARS-CoV-2 infection  and should not be used as the sole basis for treatment or other  patient management decisions.  A negative result may occur with  improper specimen collection / handling, submission of specimen other  than nasopharyngeal swab, presence of viral mutation(s) within the  areas targeted by this assay, and inadequate number of viral copies  (<250 copies / mL). A negative result must be combined with clinical  observations, patient history, and epidemiological information. If result is POSITIVE SARS-CoV-2 target nucleic acids are DETECTED. The SARS-CoV-2 RNA is generally detectable in upper and lower  respiratory specimens dur ing the acute phase of infection.  Positive  results are indicative of active infection with SARS-CoV-2.  Clinical  correlation with patient history and other diagnostic information is  necessary to determine patient infection status.  Positive results do  not rule out bacterial infection or co-infection with other viruses. If result is PRESUMPTIVE POSTIVE SARS-CoV-2 nucleic acids MAY BE PRESENT.   A presumptive positive result was obtained on the submitted specimen  and confirmed on repeat testing.  While 2019 novel coronavirus  (SARS-CoV-2) nucleic acids may be present in the submitted sample  additional confirmatory testing may be necessary for epidemiological  and / or clinical management purposes  to differentiate between  SARS-CoV-2 and other Sarbecovirus currently known to infect humans.  If clinically indicated additional testing with an alternate test  methodology [phone]) is advised. The SARS-CoV-2 RNA is generally  detectable in upper and lower respiratory sp ecimens during the acute  phase of infection. The expected result is Negative. Fact  Sheet for Patients:  StrictlyIdeas.no Fact Sheet for Healthcare Providers: BankingDealers.co.za This test is not yet approved or cleared by the [place] FDA and has been authorized for detection and/or diagnosis of SARS-CoV-2 by FDA under an Emergency Use Authorization (EUA).  This EUA will remain in effect (meaning this test can be used) for the duration of the COVID-19 declaration under Section 564(b)(1) of the Act, 21 U.S.C. section 360bbb-3(b)(1), unless the authorization is terminated or revoked sooner. Performed at [facility], [place] [address], [place], [place] [address]          Radiology Studies: No results found.      Scheduled Meds: . amiodarone  400 mg Per Tube BID   Followed by  . [START ON 06/19/2018] amiodarone  400 mg Per Tube Daily  . chlorhexidine gluconate (MEDLINE KIT)  15 mL Mouth Rinse BID  . Chlorhexidine Gluconate Cloth  6 each Topical Q0600  . feeding supplement (PRO-STAT SUGAR FREE 64)  60 mL Per Tube TID  . insulin aspart  0-20 Units Subcutaneous Q4H  . lidocaine  2 patch Transdermal Q24H  . mouth rinse  15 mL Mouth Rinse 10 times per day  . pantoprazole (PROTONIX) IV  40 mg Intravenous Q24H  . potassium chloride  40 mEq Per Tube Daily  . sodium chloride flush  10-40 mL Intracatheter Q12H   Continuous Infusions: . sodium chloride Stopped (06/15/18 1047)  . feeding supplement (OSMOLITE 1.5  CAL)    . heparin 2,200 Units/hr (06/15/18 1605)  . norepinephrine (LEVOPHED) Adult infusion Stopped (06/14/18 1740)  . piperacillin-tazobactam (ZOSYN)  IV 3.375 g (06/15/18 1351)     LOS: 6 days   The patient is critically ill with multiple organ systems failure and requires high complexity decision making for assessment and support, frequent evaluation and titration of therapies, application of advanced monitoring technologies and extensive interpretation of multiple databases. Critical Care Time  devoted to patient care services described in this note  Time spent: 40 minutes     WOODS, Geraldo Docker, MD [facility] Pager [phone]  If 7PM-7AM, please contact night-coverage [email or web address] Password [email or web address] 06/15/2018, 5:20 PM

## 2018-06-15 NOTE — Progress Notes (Signed)
eLink Physician-Brief Progress Note Patient Name: Ross Lewis DOB: [birth year] MRN: [id number]   Date of Service  06/15/2018  HPI/Events of Note  Fever to 101.9 F - AST and ALT are elevated. Therefore, no Tylenol. Creatinine = 1.98. Therefore, no Motrin.   eICU Interventions  Will order: 1. Cooling blanket PRN.      Intervention Category Major Interventions: Infection - evaluation and management  Sommer,Steven Eugene 06/15/2018, 5:55 AM

## 2018-06-16 ENCOUNTER — Inpatient Hospital Stay (HOSPITAL_COMMUNITY): Payer: Medicare Other

## 2018-06-16 DIAGNOSIS — J80 Acute respiratory distress syndrome: Secondary | ICD-10-CM

## 2018-06-16 DIAGNOSIS — G931 Anoxic brain damage, not elsewhere classified: Secondary | ICD-10-CM

## 2018-06-16 LAB — GLUCOSE, CAPILLARY
Glucose-Capillary: 155 mg/dL — ABNORMAL HIGH (ref 70–99)
Glucose-Capillary: 159 mg/dL — ABNORMAL HIGH (ref 70–99)
Glucose-Capillary: 118 mg/dL — ABNORMAL HIGH (ref 70–99)
Glucose-Capillary: 168 mg/dL — ABNORMAL HIGH (ref 70–99)
Glucose-Capillary: 158 mg/dL — ABNORMAL HIGH (ref 70–99)
Glucose-Capillary: 165 mg/dL — ABNORMAL HIGH (ref 70–99)

## 2018-06-16 LAB — CBC WITH DIFFERENTIAL/PLATELET
Abs Immature Granulocytes: 0.23 10*3/uL — ABNORMAL HIGH (ref 0.00–0.07)
Basophils Absolute: 0 10*3/uL (ref 0.0–0.1)
Basophils Relative: 0 %
Eosinophils Absolute: 0 10*3/uL (ref 0.0–0.5)
Eosinophils Relative: 0 %
HCT: 36.6 % — ABNORMAL LOW (ref 39.0–52.0)
Hemoglobin: 11.6 g/dL — ABNORMAL LOW (ref 13.0–17.0)
Immature Granulocytes: 2 %
Lymphocytes Relative: 8 %
Lymphs Abs: 0.9 10*3/uL (ref 0.7–4.0)
MCH: 31.4 pg (ref 26.0–34.0)
MCHC: 31.7 g/dL (ref 30.0–36.0)
MCV: 99.2 fL (ref 80.0–100.0)
Monocytes Absolute: 1 10*3/uL (ref 0.1–1.0)
Monocytes Relative: 8 %
Neutro Abs: 9.5 10*3/uL — ABNORMAL HIGH (ref 1.7–7.7)
Neutrophils Relative %: 82 %
Platelets: 139 10*3/uL — ABNORMAL LOW (ref 150–400)
RBC: 3.69 MIL/uL — ABNORMAL LOW (ref 4.22–5.81)
RDW: 15 % (ref 11.5–15.5)
WBC: 11.7 10*3/uL — ABNORMAL HIGH (ref 4.0–10.5)
nRBC: 0.2 % (ref 0.0–0.2)

## 2018-06-16 LAB — HEPATIC FUNCTION PANEL
ALT: 21 U/L (ref 0–44)
AST: 26 U/L (ref 15–41)
Albumin: 1.5 g/dL — ABNORMAL LOW (ref 3.5–5.0)
Alkaline Phosphatase: 63 U/L (ref 38–126)
Bilirubin, Direct: 0.8 mg/dL — ABNORMAL HIGH (ref 0.0–0.2)
Indirect Bilirubin: 1.4 mg/dL — ABNORMAL HIGH (ref 0.3–0.9)
Total Bilirubin: 2.2 mg/dL — ABNORMAL HIGH (ref 0.3–1.2)
Total Protein: 4.8 g/dL — ABNORMAL LOW (ref 6.5–8.1)

## 2018-06-16 LAB — BASIC METABOLIC PANEL
Anion gap: 6 (ref 5–15)
BUN: 66 mg/dL — ABNORMAL HIGH (ref 8–23)
CO2: 29 mmol/L (ref 22–32)
Calcium: 8.6 mg/dL — ABNORMAL LOW (ref 8.9–10.3)
Chloride: 110 mmol/L (ref 98–111)
Creatinine, Ser: 1.77 mg/dL — ABNORMAL HIGH (ref 0.61–1.24)
GFR calc Af Amer: 41 mL/min — ABNORMAL LOW (ref >60)
GFR calc non Af Amer: 36 mL/min — ABNORMAL LOW (ref >60)
Glucose, Bld: 170 mg/dL — ABNORMAL HIGH (ref 70–99)
Potassium: 4.1 mmol/L (ref 3.5–5.1)
Sodium: 145 mmol/L (ref 135–145)

## 2018-06-16 LAB — CULTURE, RESPIRATORY W GRAM STAIN

## 2018-06-16 LAB — POCT I-STAT 7, (LYTES, BLD GAS, ICA,H+H)
Acid-Base Excess: 2 mmol/L (ref 0.0–2.0)
Bicarbonate: 26.5 mmol/L (ref 20.0–28.0)
Calcium, Ion: 1.19 mmol/L (ref 1.15–1.40)
HCT: 32 % — ABNORMAL LOW (ref 39.0–52.0)
Hemoglobin: 10.9 g/dL — ABNORMAL LOW (ref 13.0–17.0)
O2 Saturation: 99 %
Potassium: 3.9 mmol/L (ref 3.5–5.1)
Sodium: 146 mmol/L — ABNORMAL HIGH (ref 135–145)
TCO2: 28 mmol/L (ref 22–32)
pCO2 arterial: 38 mmHg (ref 32.0–48.0)
pH, Arterial: 7.451 — ABNORMAL HIGH (ref 7.350–7.450)
pO2, Arterial: 133 mmHg — ABNORMAL HIGH (ref 83.0–108.0)

## 2018-06-16 LAB — COOXEMETRY PANEL
Carboxyhemoglobin: 1.4 % (ref 0.5–1.5)
Methemoglobin: 0.9 % (ref 0.0–1.5)
O2 Saturation: 69 %
Total hemoglobin: 15.6 g/dL (ref 12.0–16.0)

## 2018-06-16 LAB — PROTIME-INR
INR: 1.6 — ABNORMAL HIGH (ref 0.8–1.2)
Prothrombin Time: 19.1 seconds — ABNORMAL HIGH (ref 11.4–15.2)

## 2018-06-16 LAB — HEPARIN LEVEL (UNFRACTIONATED): Heparin Unfractionated: 0.33 IU/mL (ref 0.30–0.70)

## 2018-06-16 LAB — CULTURE, RESPIRATORY

## 2018-06-16 LAB — MAGNESIUM: Magnesium: 2.1 mg/dL (ref 1.7–2.4)

## 2018-06-16 MED ORDER — ACETAMINOPHEN 160 MG/5ML PO SOLN
650.0000 mg | ORAL | Status: DC | PRN
  Administered 2018-06-16 – 2018-06-17 (×2): 650 mg
  Filled 2018-06-16 (×2): qty 20.3

## 2018-06-16 MED ORDER — CHLORHEXIDINE GLUCONATE 0.12 % MT SOLN
OROMUCOSAL | Status: AC
  Administered 2018-06-17: 15 mL via OROMUCOSAL
  Filled 2018-06-16: qty 15

## 2018-06-16 MED ORDER — SODIUM CHLORIDE 0.9 % IV SOLN
2.0000 g | Freq: Two times a day (BID) | INTRAVENOUS | Status: DC
  Administered 2018-06-16 – 2018-06-17 (×3): 2 g via INTRAVENOUS
  Filled 2018-06-16 (×4): qty 2

## 2018-06-16 MED ORDER — FUROSEMIDE 10 MG/ML IJ SOLN
40.0000 mg | Freq: Three times a day (TID) | INTRAMUSCULAR | Status: DC
  Administered 2018-06-16 – 2018-06-17 (×5): 40 mg via INTRAVENOUS
  Filled 2018-06-16 (×3): qty 4

## 2018-06-16 NOTE — Progress Notes (Signed)
Anders Simmonds NP aware of CXR results read back from radiology.

## 2018-06-16 NOTE — Progress Notes (Signed)
78 year old man admitted 4/10 after V. fib arrest at home received bystander CPR, estimated downtime about 15 minutes, underwent TTM  Course was complicated by acute encephalopathy and ARDS and persistent fever, he has a history of COVID contact and even though 2 swabs were negative, he was placed under isolation due to ongoing fever and tracheal aspirate was sent for repeat testing  He is critically ill, orally intubated, febrile, on 60%/PEEP of 12 On exam-remains unresponsive off sedation, tolerating tube feeds, rest per NP exam-decreased breath sounds bilateral, atrial fibrillation on monitor  Chest x-ray personally reviewed 4/17 which shows bilateral airspace disease, more prominent on the left, ET tube 1 cm above carina.  Labs show improved PO2 of 133, decreasing creatinine, normal electrolytes, mild leukocytosis and stable anemia.  Impression/plan Anoxic encephalopathy-he needs neuro prognostication with MRI but unfortunately this is being delayed due to repeat COVID testing.  We will try to obtain head CT without contrast meantime  V. fib cardiac arrest-no intervention per cardiology, needs echo and ischemia evaluation if he has neurologic recovery. Continue amiodarone and heparin for atrial fibrillation  ARDS due to gram-negative/Enterobacter pneumonia-changed to ceftriaxone from Zosyn -Continue Lasix for negative balance Doubt this is covid but tracheal aspirate has been sent  The patient is critically ill with multiple organ systems failure and requires high complexity decision making for assessment and support, frequent evaluation and titration of therapies, application of advanced monitoring technologies and extensive interpretation of multiple databases. Critical Care Time devoted to patient care services described in this note independent of APP/resident  time is 35 minutes.   Comer Locket Vassie Loll MD

## 2018-06-16 NOTE — Progress Notes (Signed)
Pt transported from 2H23 to CT and back without incident.

## 2018-06-16 NOTE — Progress Notes (Signed)
Ross Lewis SKA:[id number] DOB: Dec 27, 1940 DOA: 2018/06/29 PCP: System, Pcp Not In   Subj:78 yo WM [employer] PMHx RIGHT cerebellar CVA/TIA, atrial fibrillation on warfarin, HTN, CKD stage II, nephrolithiasis.   Afib withwitness arrest cpr initiated by fire dept. Pt was intubated with king airway and with initial rhythm vfib with defib and ROSC within 15 minutes. Another vfib arrest in transport with defib shock and immediate ROSC. Fire dept notes patient had know COVID contact.    Discussed case with NP Zenia Resides, PCCM patient still unresponsive  Obj: Objective: VITAL SIGNS: Temp: 99.7 F (37.6 C) (04/17 1152) Temp Source: Bladder (04/17 1100) BP: 129/55 (04/17 1130) Pulse Rate: 87 (04/17 1152) SPO2; FIO2:   Intake/Output Summary (Last 24 hours) at 06/16/2018 1232 Last data filed at 06/16/2018 1100 Gross per 24 hour  Intake 1520.73 ml  Output 2825 ml  Net -1304.27 ml     Exam: Did not examine patient secondary to patient still being in negative pressure room secondary to presumptive COVID-19 positive   Consultants ID Neurology phone consult with Onalee Hua  Procedure/Significant Events: 4/10 ETT  4/10 Vfib arrest x2 defib 4/10 hypothermia protocol  4/10 LEFT arterial line 4/11 RIGHT IJ  I have personally reviewed and interpreted all radiology studies and my findings are as above.   Culture 4/11 COVID-19 negative 4/15 SARS coronavirus negative 4/16 SARS Coronavirus Trach aspirate Pending  Antibiotics: Anti-infectives (From admission, onward)   Start     Ordered Stop   06/16/18 1300  ceFEPIme (MAXIPIME) 2 g in sodium chloride 0.9 % 100 mL IVPB     06/16/18 1231     06/13/18 2315  piperacillin-tazobactam (ZOSYN) IVPB 3.375 g  Status:  Discontinued     06/13/18 2303 06/16/18 1231       A/P Ischemic hypoxic encephalopathy/Anoxic brain injury? -Patient off paralytics/sedating medication for~36 hours and no change in mental status i.e.  still unresponsive. - If patient survives and we can clear from COVID would obtain MRI and possibly EEG - 4/17 spoke with Dr. Onalee Hua neurology and discussed our problem with getting any study completed to verify patient's anoxic brain injury.  Dr. Uvaldo Rising recommended head CT without contrast if radiology would clear.  Contacted Dr. Roselie Skinner radiologist explained that patient was presumptive COVID-19 positive and he cleared patient for head CT without contrast.  Dr. Uvaldo Rising will review head CT when complete. - NP Zenia Resides aware of plan       Care during the described time interval was provided by me .  I have reviewed this patient's available data, including medical history, events of note, physical examination, and all test results as part of my evaluation.

## 2018-06-16 NOTE — Progress Notes (Signed)
NAME:  Ross Lewis, MRN:  [id number], DOB:  1941-01-21 06/01/2018, LOS: 7 ADMISSION DATE:  06/24/2018, CONSULTATION DATE: 4/10/ 2020 REFERRING MD: Vallery RidgeLong-ED [place], CHIEF COMPLAINT: Status post cardiac arrest  HPI/course in hospital  78 year old man with limited past medical history. Suffered a witnessed cardiac arrest at home.  CPR initiated after 3 minutes.  Defibrillated from ventricular fibrillation.  Spontaneous circulation restored after 15 minutes. Placed in isolation for reported COVID-19 contact. Started on TTM.  +++ Name is incorrect: Correct name is Ross Lewis 1941-01-21 +++   Past Medical History    . Atrial fibrillation ([name]) followed by Dr. Rennis GoldenHilty on warfarin.   . Chronic back pain   . CKD (chronic kidney disease), stage II   . Hypertension   . Nephrolithiasis   . Stroke [facility]([name]) followed by Dr. Pearlean BrownieSethi     right cerebellar infarct  . TIA (transient ischemic attack) last in March    Hospital course  4/16: Off from neuromuscular blockade, off from IV sedating drips. Since 4/15 Still desaturates. Remains unresponsive even to noxious stimuli.  Spoke with infectious disease.  They are recommending ongoing respiratory isolation in spite of to negative COVID tests given on-going fever 4/17: COVID sent out on 4/16 from tracheal aspirate.  Continues to have fever overnight otherwise no significant changes still awaiting final COVID rule out to begin neurodiagnostics Tests/diagnostics  A bedside point-of-care echocardiogram 4/11 which showed normal LV size with hyperdynamic function.  RV dilatation with moderate dysfunction.  No significant valvular abnormalities noted.  No pericardial effusion  Interim history/subjective:  Remains febrile with no significant changes overnight  Objective   Blood pressure (Abnormal) 141/59, pulse 86, temperature (Abnormal) 101.3 F (38.5 C), resp. rate (Abnormal) 26, height 5\' 6"  (1.676 m), weight 92.5 kg, SpO2 99 %. CVP:  [6  mmHg-15 mmHg] 6 mmHg  Vent Mode: PRVC FiO2 (%):  [70 %-90 %] 70 % Set Rate:  [24 bmp] 24 bmp Vt Set:  [510 mL-540 mL] 510 mL PEEP:  [12 cmH20] 12 cmH20 Plateau Pressure:  [18 cmH20-21 cmH20] 20 cmH20   Intake/Output Summary (Last 24 hours) at 06/16/2018 0804 Last data filed at 06/16/2018 0600 Gross per 24 hour  Intake 871.91 ml  Output 2375 ml  Net -1503.09 ml   Filed Weights   06/14/18 0500 06/15/18 0123 06/16/18 0141  Weight: 95.5 kg 93.7 kg 92.5 kg   Remote exam General: 78 year old white male he remains unresponsive with the exception of cough to deep suction HEENT normocephalic atraumatic orally intubated pupils equal reactive mucous membranes moist Pulmonary: Diminished bases equal chest rise Cardiac: Atrial fibrillation with controlled ventricular rate Abdomen: Soft not tender tolerating tube feeds Extremities: Warm and dry, diffuse anasarca pulses palpable Neuro: Responds only to noxious stimulus.  Has a cough only.  Gets tachypneic from time to time but otherwise no response GU: Clear yellow    Assessment & Plan:   S/p VF cardiac arrest Plan Continue telemetry monitoring  Eventually needs echocardiogram  Doubt he will be a further candidate for cardiac intervention given what appears to be significant neurological insult   Acute hypoxemic respiratory failure s/p cardiac arrest  Favor aspiration PNA over COVID (has had 2 negative tests) + possible pulm contusions Pcxr: Personally reviewed this demonstrates the following: Persistent bilateral airspace disease the left side airspace disease has progressively worsened in comparison to prior film, the right middle lobe consolidation has improved some but continues to have right greater than left airspace disease significant right-sided airspace  disease as well plan Continue full ventilator support  Decrease PEEP today to 10  Day #4 Zosyn  Follow-up pending cultures as well as repeat COVID send out  Scheduled Lasix as  long as BUN/creatinine tolerate  Continue respiratory isolation  Chest x-ray PRN  R/o COVID -19 -has had two neg test via nasal swab BUT still having fevers. Unfortunately...LFTs and ddimer unlikely to be helpful. It does seem as though we have alternative explaination for his fever: pulm contusions, anoxic injury, aspiration Plan Continue isolation  Sent tracheal aspirate to [facility] for confirmatory COVID testing, this will be his third rule out test  Continue watch fever curve per discussion with infectious disease will need 72 hours with no fever and no concomitant administration of antipyretics before we can consider discontinue isolation without negative test   Anoxic brain injury s/p cardiac arrest -completed TTM Plan Needs MRI once COVID ruled out Minimize sedation   AF w/ CVR Plan No change in telemetry monitoring or therapeutics with amiodarone and heparin  CRI Creatinine has been stable, improving Plan Avoid hypotension Renal dose medications A.m. chemistry  Diabetes with hyperglycemia Plan Sliding scale insulin  Mild coagulopathy, warfarin induced Stable, slowly trending down Plan Holding warfarin while on heparin A.m. INR  Best practice:  Diet: No enteral access.  Pain/Anxiety/Delirium protocol (if indicated): Off from neuromuscular blockade and IV sedating infusions VAP protocol (if indicated): Bundle in place DVT prophylaxis: Therapeutic heparin IV GI prophylaxis: Pantoprazole Glucose control: Sliding scale insulin  mobility: Bedrest Code Status: Full code Family Communication: Spoke to patient's wife Ross Lewis who is a Lexicographer.  She denies any credible COVID-19 exposure.  No significant changes overall plan will be the same we will continue diuresis continue empiric antibiotics awaiting COVID send out.  After this we can proceed with neurodiagnostics  Ross Lewis ACNP-BC [facility] Pulmonary/Critical Care Pager # [phone] OR #  [phone] if no answer

## 2018-06-16 NOTE — Progress Notes (Signed)
ANTICOAGULATION CONSULT NOTE   Pharmacy Consult for Heparin Indication: atrial fibrillation, possible PE  No Known Allergies  Patient Measurements: Height: 5\' 6"  (167.6 cm) Weight: 203 lb 14.8 oz (92.5 kg) IBW/kg (Calculated) : 63.8 Heparin Dosing Weight: 85.4  Vital Signs: Temp: 101.1 F (38.4 C) (04/17 0700) Temp Source: Bladder (04/17 0400) BP: 139/61 (04/17 0700) Pulse Rate: 80 (04/17 0700)  Labs: Recent Labs    06/14/18 0546  06/15/18 0532 06/15/18 1015 06/15/18 1719 06/16/18 0445 06/16/18 0446 06/16/18 0502  HGB 11.4*  --  11.2*  --   --   --  11.6* 10.9*  HCT 36.0*  --  33.4*  --   --   --  36.6* 32.0*  PLT 114*  --  125*  --   --   --  139*  --   LABPROT 20.3*  --  19.6*  --   --   --  19.1*  --   INR 1.8*  --  1.7*  --   --   --  1.6*  --   HEPARINUNFRC <0.10*   < >  --  0.38 0.35 0.33  --   --   CREATININE 1.98*  --  1.91*  --   --   --  1.77*  --    < > = values in this interval not displayed.    Estimated Creatinine Clearance: 36 mL/min (A) (by C-G formula based on SCr of 1.77 mg/dL (H)).   Medical History: Past Medical History:  Diagnosis Date  . Atrial fibrillation ([name])   . Chronic back pain   . CKD (chronic kidney disease), stage II   . Hypertension   . Nephrolithiasis   . Stroke [facility])    right cerebellar infarct  . TIA (transient ischemic attack)     Medications:  Cardizem  Flomax  Coumadin 5 mg daily except 7.5 mg Fridays  Assessment: 78 y.o. male admitted s/p cardiac arrest, now with ROSC and placed on hypothermia protocol, h/o Afib and Coumadin on hold, for heparin.  Per most recent Coumadin Clinic note, INR goal was 2.5-3.5. INR  increased to >9 and received IV Vit K on 4/12  Heparin level 0.33 this morning in goal range, but is drifting down slowly. Will increase infusion slightly to keep therapeutic.  Goal of Therapy:  INR 2.5-3.5 Heparin level 0.3-0.7 units/ml Monitor platelets by anticoagulation protocol: Yes    Plan:  Increase heparin drip 2300 units/hr  Daily heparin level and CBC  Thanks for allowing pharmacy to be a part of this patient's care.  Ewing Schlein, PharmD PGY1 Pharmacy Resident 06/16/2018    7:46 AM Please check AMION for all [facility] Pharmacy numbers

## 2018-06-16 NOTE — Progress Notes (Signed)
Assisted tele visit to patient with wife.  Abbygail Willhoite Anderson, RN   

## 2018-06-17 ENCOUNTER — Encounter (HOSPITAL_COMMUNITY): Payer: Self-pay | Admitting: Otolaryngology

## 2018-06-17 DIAGNOSIS — L8990 Pressure ulcer of unspecified site, unspecified stage: Secondary | ICD-10-CM

## 2018-06-17 DIAGNOSIS — N182 Chronic kidney disease, stage 2 (mild): Secondary | ICD-10-CM

## 2018-06-17 LAB — DIC (DISSEMINATED INTRAVASCULAR COAGULATION)PANEL
D-Dimer, Quant: 4.55 ug/mL-FEU — ABNORMAL HIGH (ref 0.00–0.50)
Fibrinogen: >800 mg/dL — ABNORMAL HIGH (ref 210–475)
INR: 1.9 — ABNORMAL HIGH (ref 0.8–1.2)
Platelets: 159 10*3/uL (ref 150–400)
Prothrombin Time: 21.6 seconds — ABNORMAL HIGH (ref 11.4–15.2)
Smear Review: NONE SEEN
aPTT: 32 seconds (ref 24–36)

## 2018-06-17 LAB — CBC
HCT: 36.9 % — ABNORMAL LOW (ref 39.0–52.0)
Hemoglobin: 12.1 g/dL — ABNORMAL LOW (ref 13.0–17.0)
MCH: 33 pg (ref 26.0–34.0)
MCHC: 32.8 g/dL (ref 30.0–36.0)
MCV: 100.5 fL — ABNORMAL HIGH (ref 80.0–100.0)
Platelets: 152 10*3/uL (ref 150–400)
RBC: 3.67 MIL/uL — ABNORMAL LOW (ref 4.22–5.81)
RDW: 15.2 % (ref 11.5–15.5)
WBC: 13.5 10*3/uL — ABNORMAL HIGH (ref 4.0–10.5)
nRBC: 0 % (ref 0.0–0.2)

## 2018-06-17 LAB — BASIC METABOLIC PANEL
Anion gap: 12 (ref 5–15)
BUN: 86 mg/dL — ABNORMAL HIGH (ref 8–23)
CO2: 24 mmol/L (ref 22–32)
Calcium: 8.3 mg/dL — ABNORMAL LOW (ref 8.9–10.3)
Chloride: 111 mmol/L (ref 98–111)
Creatinine, Ser: 1.96 mg/dL — ABNORMAL HIGH (ref 0.61–1.24)
GFR calc Af Amer: 37 mL/min — ABNORMAL LOW (ref >60)
GFR calc non Af Amer: 32 mL/min — ABNORMAL LOW (ref >60)
Glucose, Bld: 211 mg/dL — ABNORMAL HIGH (ref 70–99)
Potassium: 4.1 mmol/L (ref 3.5–5.1)
Sodium: 147 mmol/L — ABNORMAL HIGH (ref 135–145)

## 2018-06-17 LAB — POCT I-STAT 7, (LYTES, BLD GAS, ICA,H+H)
Acid-Base Excess: 3 mmol/L — ABNORMAL HIGH (ref 0.0–2.0)
Bicarbonate: 26.8 mmol/L (ref 20.0–28.0)
Calcium, Ion: 1.23 mmol/L (ref 1.15–1.40)
HCT: 33 % — ABNORMAL LOW (ref 39.0–52.0)
Hemoglobin: 11.2 g/dL — ABNORMAL LOW (ref 13.0–17.0)
O2 Saturation: 96 %
Patient temperature: 98.9
Potassium: 4 mmol/L (ref 3.5–5.1)
Sodium: 148 mmol/L — ABNORMAL HIGH (ref 135–145)
TCO2: 28 mmol/L (ref 22–32)
pCO2 arterial: 37.2 mmHg (ref 32.0–48.0)
pH, Arterial: 7.466 — ABNORMAL HIGH (ref 7.350–7.450)
pO2, Arterial: 76 mmHg — ABNORMAL LOW (ref 83.0–108.0)

## 2018-06-17 LAB — CBC WITH DIFFERENTIAL/PLATELET
Abs Immature Granulocytes: 0.28 10*3/uL — ABNORMAL HIGH (ref 0.00–0.07)
Basophils Absolute: 0 10*3/uL (ref 0.0–0.1)
Basophils Relative: 0 %
Eosinophils Absolute: 0 10*3/uL (ref 0.0–0.5)
Eosinophils Relative: 0 %
HCT: 35.6 % — ABNORMAL LOW (ref 39.0–52.0)
Hemoglobin: 11.3 g/dL — ABNORMAL LOW (ref 13.0–17.0)
Immature Granulocytes: 2 %
Lymphocytes Relative: 6 %
Lymphs Abs: 0.8 10*3/uL (ref 0.7–4.0)
MCH: 32 pg (ref 26.0–34.0)
MCHC: 31.7 g/dL (ref 30.0–36.0)
MCV: 100.8 fL — ABNORMAL HIGH (ref 80.0–100.0)
Monocytes Absolute: 0.7 10*3/uL (ref 0.1–1.0)
Monocytes Relative: 5 %
Neutro Abs: 11.3 10*3/uL — ABNORMAL HIGH (ref 1.7–7.7)
Neutrophils Relative %: 87 %
Platelets: 147 10*3/uL — ABNORMAL LOW (ref 150–400)
RBC: 3.53 MIL/uL — ABNORMAL LOW (ref 4.22–5.81)
RDW: 15.3 % (ref 11.5–15.5)
WBC: 13.1 10*3/uL — ABNORMAL HIGH (ref 4.0–10.5)
nRBC: 0 % (ref 0.0–0.2)

## 2018-06-17 LAB — COOXEMETRY PANEL
Carboxyhemoglobin: 1.7 % — ABNORMAL HIGH (ref 0.5–1.5)
Methemoglobin: 1 % (ref 0.0–1.5)
O2 Saturation: 64.9 %
Total hemoglobin: 11.7 g/dL — ABNORMAL LOW (ref 12.0–16.0)

## 2018-06-17 LAB — CULTURE, BLOOD (ROUTINE X 2)

## 2018-06-17 LAB — GLUCOSE, CAPILLARY
Glucose-Capillary: 151 mg/dL — ABNORMAL HIGH (ref 70–99)
Glucose-Capillary: 190 mg/dL — ABNORMAL HIGH (ref 70–99)
Glucose-Capillary: 163 mg/dL — ABNORMAL HIGH (ref 70–99)

## 2018-06-17 LAB — PROTIME-INR
INR: 1.9 — ABNORMAL HIGH (ref 0.8–1.2)
Prothrombin Time: 21.1 seconds — ABNORMAL HIGH (ref 11.4–15.2)

## 2018-06-17 LAB — MAGNESIUM: Magnesium: 2.3 mg/dL (ref 1.7–2.4)

## 2018-06-17 MED ORDER — FUROSEMIDE 10 MG/ML IJ SOLN: 40.0000 mg | Freq: Every day | INTRAMUSCULAR | Status: DC

## 2018-06-17 MED ORDER — DIPHENHYDRAMINE HCL 50 MG/ML IJ SOLN: 25.0000 mg | INTRAMUSCULAR | Status: DC | PRN

## 2018-06-17 MED ORDER — ACETAMINOPHEN 650 MG RE SUPP
650.0000 mg | Freq: Four times a day (QID) | RECTAL | Status: DC | PRN
  Administered 2018-06-17: 650 mg via RECTAL
  Filled 2018-06-17: qty 1

## 2018-06-17 MED ORDER — OXYMETAZOLINE HCL 0.05 % NA SOLN
2.0000 | Freq: Once | NASAL | Status: AC
  Administered 2018-06-17: 2 via NASAL
  Filled 2018-06-17: qty 30

## 2018-06-17 MED ORDER — POLYVINYL ALCOHOL 1.4 % OP SOLN
1.0000 [drp] | Freq: Four times a day (QID) | OPHTHALMIC | Status: DC | PRN
  Filled 2018-06-17: qty 15

## 2018-06-17 MED ORDER — MORPHINE 100MG IN NS 100ML (1MG/ML) PREMIX INFUSION
0.0000 mg/h | INTRAVENOUS | Status: DC
  Administered 2018-06-17: 5 mg/h via INTRAVENOUS
  Filled 2018-06-17: qty 100

## 2018-06-17 MED ORDER — GLYCOPYRROLATE 1 MG PO TABS
1.0000 mg | ORAL_TABLET | ORAL | Status: DC | PRN
  Filled 2018-06-17: qty 1

## 2018-06-17 MED ORDER — OXYMETAZOLINE HCL 0.05 % NA SOLN
3.0000 | Freq: Two times a day (BID) | NASAL | Status: DC | PRN
  Filled 2018-06-17: qty 30

## 2018-06-17 MED ORDER — PANTOPRAZOLE SODIUM 40 MG PO PACK: 40.0000 mg | PACK | Freq: Every day | ORAL | Status: DC

## 2018-06-17 MED ORDER — GLYCOPYRROLATE 0.2 MG/ML IJ SOLN: 0.2000 mg | INTRAMUSCULAR | Status: DC | PRN

## 2018-06-17 MED ORDER — ACETAMINOPHEN 325 MG PO TABS: 650.0000 mg | ORAL_TABLET | Freq: Four times a day (QID) | ORAL | Status: DC | PRN

## 2018-06-17 MED ORDER — MORPHINE BOLUS VIA INFUSION
5.0000 mg | INTRAVENOUS | Status: DC | PRN
  Administered 2018-06-17 (×10): 5 mg via INTRAVENOUS
  Filled 2018-06-17: qty 5

## 2018-06-17 MED ORDER — SALINE SPRAY 0.65 % NA SOLN
1.0000 | NASAL | Status: DC
  Filled 2018-06-17: qty 44

## 2018-06-17 MED ORDER — TRANEXAMIC ACID-NACL 1000-0.7 MG/100ML-% IV SOLN
1000.0000 mg | Freq: Once | INTRAVENOUS | Status: AC
  Administered 2018-06-17: 1000 mg via INTRAVENOUS
  Filled 2018-06-17: qty 100

## 2018-06-17 MED ORDER — FREE WATER
200.0000 mL | Status: DC
  Administered 2018-06-17 (×3): 200 mL

## 2018-06-17 MED ORDER — CHLORPROMAZINE HCL 25 MG PO TABS
25.0000 mg | ORAL_TABLET | Freq: Three times a day (TID) | ORAL | Status: DC
  Administered 2018-06-17 (×2): 25 mg
  Filled 2018-06-17 (×3): qty 1

## 2018-06-17 MED ORDER — SALINE SPRAY 0.65 % NA SOLN
1.0000 | NASAL | Status: DC
  Administered 2018-06-17 (×6): 1 via NASAL
  Filled 2018-06-17: qty 44

## 2018-06-17 MED ORDER — MORPHINE SULFATE (PF) 2 MG/ML IV SOLN: 2.0000 mg | INTRAVENOUS | Status: DC | PRN

## 2018-06-17 MED ORDER — DEXTROSE 5 % IV SOLN: INTRAVENOUS | Status: DC

## 2018-06-18 LAB — GLUCOSE, CAPILLARY
Glucose-Capillary: 164 mg/dL — ABNORMAL HIGH (ref 70–99)
Glucose-Capillary: 196 mg/dL — ABNORMAL HIGH (ref 70–99)

## 2018-06-18 NOTE — Progress Notes (Signed)
15.4 mL IV Morphine wasted in Stericycle container. Wasted confirmed by Bea Laura, RN.

## 2018-06-18 NOTE — Progress Notes (Signed)
Patient extubated with RN and Respiratory therapy at bedside as ordered at Jul 09, 2143 per withdrawal of care orders. Patients wife and son arrived at beginning of shift. On call MD notified of family's decision to withdraw care as patient has living will and to continue with aggressive care was not what he wanted. On call MD discussed withdrawal of care over the phone and family in agreement. Morphine gtt initiated and titrated as ordered. Patient with respiratory rate greater than 30 prior to extubation and post extubation. Morphine provided to increase comfort and decrease air hunger. Wife and son assisted into full PPE (N95, face shield, gown and gloves) with RN supervision and guidance so that they may visit and be present at bedside. Emotional support provided by RN and Chaplain services.   Patients went from afib to a wide complex agonal rhythm (PEA). Patient apneic as well. Patient without a pulse (carotid/radial assessed) and heart tones absent. Assessment for spontaneous breathing and pulse completed over 60 seconds. Patients time of death noted as Jul 09, 2310. 2 RN verification completed. Wife at bedside at time of patient passing. Family made aware of time of death. Family greatly appreciative of support and care provided by the critical care team. Family denies need for autopsy and funeral home information obtained. Charge RN and [facility] notified and updated. On call MD notified and updated regarding patient being deceased.

## 2018-06-19 ENCOUNTER — Encounter (HOSPITAL_COMMUNITY): Payer: Self-pay | Admitting: Emergency Medicine

## 2018-06-19 LAB — CULTURE, BLOOD (ROUTINE X 2): Culture: NO GROWTH

## 2018-06-19 LAB — NOVEL CORONAVIRUS, NAA (HOSP ORDER, SEND-OUT TO REF LAB; TAT 18-24 HRS)

## 2018-06-20 ENCOUNTER — Ambulatory Visit: Payer: Self-pay | Admitting: Pharmacist Clinician (PhC)/ Clinical Pharmacy Specialist

## 2018-06-20 DIAGNOSIS — Z7901 Long term (current) use of anticoagulants: Secondary | ICD-10-CM

## 2018-06-20 DIAGNOSIS — I482 Chronic atrial fibrillation, unspecified: Secondary | ICD-10-CM

## 2018-06-30 NOTE — Progress Notes (Signed)
NAME:  Ross Lewis, MRN:  [id number], DOB:  March 31, 1940 06/24/2018, LOS: 8 ADMISSION DATE:  06/01/2018, CONSULTATION DATE: 4/10/ 2020 REFERRING MD: Vallery Ridge, CHIEF COMPLAINT: Status post cardiac arrest  HPI/course in hospital  78 year old man with limited past medical history. Suffered a witnessed cardiac arrest at home.  CPR initiated after 3 minutes.  Defibrillated from ventricular fibrillation.  Spontaneous circulation restored after 15 minutes. Placed in isolation for reported COVID-19 contact. Started on TTM.  +++ Name is incorrect: Correct name is Ross Lewis [birth year] +++   Past Medical History    . Atrial fibrillation ([name]) followed by Dr. Rennis Golden on warfarin.   . Chronic back pain   . CKD (chronic kidney disease), stage II   . Hypertension   . Nephrolithiasis   . Stroke [facility]) followed by Dr. Pearlean Brownie     right cerebellar infarct  . TIA (transient ischemic attack) last in March    Culture data  Blood culture 4/14>>> This shows 1 out of 2 coag negative staph most likely contamination Sputum culture 4/15: Enterobacter cancerogenus  Antibiotics  Zosyn 4/14 through 4/16 Cefepime 4/16>>> Hospital course  4/16: Off from neuromuscular blockade, off from IV sedating drips. Since 4/15 Still desaturates. Remains unresponsive even to noxious stimuli.  Spoke with infectious disease.  They are recommending ongoing respiratory isolation in spite of to negative COVID tests given on-going fever 4/17: COVID sent out on 4/16 from tracheal aspirate.  Continues to have fever overnight otherwise no significant changes still awaiting final COVID rule out to begin neurodiagnostics 4/18: Had refractory epistaxis during the evening/early morning hours received tranexamic acid, Afrin, seen by ENT, heparin stopped locally epistaxis stopped after this.  Foley catheter removed the day prior, fever curve a little better, removing central line.  Continue to await COVID so that we  can proceed with further neurological testing he needs MRI  Tests/diagnostics  A bedside point-of-care echocardiogram 4/11 which showed normal LV size with hyperdynamic function.  RV dilatation with moderate dysfunction.  No significant valvular abnormalities noted.  No pericardial effusion CT brain 4/17: Negative Interim history/subjective:  Stable currently  Objective   Blood pressure (Abnormal) 142/62, pulse 89, temperature 99.7 F (37.6 C), temperature source Oral, resp. rate (Abnormal) 31, height 5\' 6"  (1.676 m), weight 92.8 kg, SpO2 (Abnormal) 87 %. CVP:  [6 mmHg-7 mmHg] 7 mmHg  Vent Mode: PRVC FiO2 (%):  [40 %-100 %] 40 % Set Rate:  [24 bmp] 24 bmp Vt Set:  [510 mL] 510 mL PEEP:  [8 cmH20-12 cmH20] 8 cmH20 Plateau Pressure:  [17 cmH20-29 cmH20] 18 cmH20   Intake/Output Summary (Last 24 hours) at 06-23-18 0926 Last data filed at June 23, 2018 0900 Gross per 24 hour  Intake 1640.17 ml  Output 2048 ml  Net -407.83 ml   Filed Weights   06/15/18 0123 06/16/18 0141 06-23-18 0439  Weight: 93.7 kg 92.5 kg 92.8 kg   Remote exam General: 78 year old male currently remains minimally responsive with the exception of gag and cough HEENT: Orally intubated.  No further epistaxis pupils equal reactive Pulmonary: Diminished equal chest rise FiO2 and PEEP requirements improved Cardiac: Regular irregular with atrial fibrillation on telemetry no murmur rub or gallop Abdomen: Soft nontender Extremities: Warm dry diffuse anasarca pulses are palpable Neuro: Unresponsive with exception of gag and cough no focal movement.  No purposeful movement.  Intermittently will open eyes while turning. GU: Clear yellow  Assessment & Plan:   S/p VF cardiac arrest Plan Continue telemetry monitoring  Once COVID is ruled out and mental status declared could consider follow-up echocardiogram.  I am not sure this is warranted currently given what seems to be significant anoxic brain injury    Anoxic brain  injury s/p cardiac arrest -completed TTM Plan Minimizing sedation MRI once COVID ruled out EEG once COVID ruled out  Acute hypoxemic respiratory failure s/p cardiac arrest  Favor aspiration PNA over COVID (has had 2 negative tests) + possible pulm contusions -Sputum positive for Enterobacter CANCERROGENOUS -PEEP and FiO2 requirements have significantly improved with diuresis plan Continue to push diuresis as long as BUN and creatinine allow Day #5 of 8 antibiotics.  Changed to cefepime Continue pulse oximetry A.m. chest x-ray VAP bundle  Hiccups Plan Add scheduled Thorazine Suspect diaphragmatic spasm contributing to ventilator compliance  Fever, leukocytosis, R/o COVID -19 -has had two neg test via nasal swab BUT still having fevers. Unfortunately...LFTs and ddimer unlikely to be helpful. It does seem as though we have alternative explaination for his fever: pulm contusions, anoxic injury, aspiration. -doubt COVID -urinary cath removed 4/17 Plan Continue current isolation strategy  Tracheal aspirate sent to lab core, this will be third rule out test  Cont to rx PNA Remove CVL  Life-threatening epistaxis 4/17 -Received tranexamic acid, afrin and pressure. Heparin stopped; stopped at time ENT arrived Plan Hold heparin Cont afrin per ENT   AF w/ CVR Plan Continuing amiodarone Heparin discontinued due to epistaxis  CRI Creatinine and BUN a little worse Plan Back down on diuresis Continue daily chemistries for now Avoid hypotension  Fluid and electrolyte imbalance: Hypernatremia Plan Add free water replacement via tube Back off from diuretics A.m. chemistry  Diabetes with hyperglycemia Plan Sliding scale insulin, will escalate basal dosing given hyperglycemia  Mild coagulopathy, warfarin induced Stable, slowly trending down Plan Holding anticoagulation giving recent life-threatening epistaxis  Best practice:  Diet: No enteral access.   Pain/Anxiety/Delirium protocol (if indicated): Off from neuromuscular blockade and IV sedating infusions VAP protocol (if indicated): Bundle in place DVT prophylaxis: Therapeutic heparin IV GI prophylaxis: Pantoprazole Glucose control: Sliding scale insulin  mobility: Bedrest Code Status: Full code Family Communication: Spoke to patient's wife Meriam SpragueBeverly who is a Lexicographerpsych mental health nurse.  She denies any credible COVID-19 exposure.  Continuing supportive care.  We will touch base with wife later today.  Ongoing diuresis with adjusted dosing schedule.  Unfortunately need to hold anticoagulation given epistaxis.  I suspect he has had a life altering neurological insult  Simonne MartinetPeter E Kassidy Frankson ACNP-BC [facility] Pulmonary/Critical Care Pager # [phone] OR # [phone] if no answer

## 2018-06-30 NOTE — Progress Notes (Signed)
Dr. Vassie Loll paged RN. He spoke with Infectious Disease to see if we could lift precautions for family to be at bedside with patient. Infectious disease communicated that if family went in the room, they would need to wear gowns and masks.  When family arrives, RN to call over to [facility] and have MD speak discuss plan of care with family. RN will communicate with oncoming nightshift RN regarding circumstance.

## 2018-06-30 NOTE — Progress Notes (Signed)
PROGRESS NOTE    Ross Lewis  IZT:[id number] DOB: 1940/08/17 DOA: 06/25/2018 PCP: System, Pcp Not In  Outpatient Specialists:   Brief Narrative: Patient is a 78 year old male past medical history significant for atrial fibrillation, chronic kidney disease, TIA and hypertension.  Patient was admitted following V. fib cardiac arrest.  Patient was defibrillated with ROSC within 16 minutes.  Patient was intubated and admitted for further assessment.  There was concern for possible COVID on presentation, as collateral information revealed possible contact with COVID.  COVID 19 testing has come negative on 2 occasions.  However, patient continues to have fever, and a significant respiratory distress, therefore, COVID-19 precautions are still in place.  Further testing for COVID-19 is in process.  Sputum culture grew Enterobacter.  Patient remains unresponsive.  Guarded prognosis.    Assessment & Plan:   Active Problems:   Cardiac arrest ([place])   Cardiogenic shock ([name])   Acute respiratory failure with hypoxia ([name])   Ischemic encephalopathy   Unspecified atrial fibrillation ([name])   CKD (chronic kidney disease), stage II   CVA (cerebral vascular accident) ([place])   Anoxic brain injury ([place])   Lobar pneumonia ([place])   ARDS (adult respiratory distress syndrome) ([name])   Pressure injury of skin   Status post cardiac arrest with encephalopathy: For MRI and neuro prognostication when closely testing is finalized. Continue current management.  Acute respiratory failure: Query ARDS Pulmonary/critical care team is managing. Patient remains on the vent.  Possible anoxic versus hypoxic encephalopathy: Patient is status post cardiac arrest  Fever/pneumonia: Rule out COVID Continue antibiotics (Cefepime).  Atrial fibrillation: On amiodarone. Heparin discontinued due to a nosebleed.   DVT prophylaxis: Heparin discontinued Code Status: Full Family Communication:  Disposition Plan: Depend  on hospital course.  Guarded prognosis   Consultants:   Pulmonary/critical care team  ENT for nosebleeds  Procedures:   Intubation  Antimicrobials:   IV cefepime   Subjective: No history from patient  Objective: Vitals:   07/03/18 1600 2018/07/03 1609 2018-07-03 1630 07/03/2018 1700  BP: (!) 150/74 (!) 150/74 (!) 147/63 (!) 152/79  Pulse: 85  86 88  Resp: (!) 27  (!) 33 (!) 31  Temp: (!) 100.5 F (38.1 C)     TempSrc: Oral     SpO2: 98%  97% 97%  Weight:      Height:        Intake/Output Summary (Last 24 hours) at 2018/07/03 1718 Last data filed at 07-03-2018 1600 Gross per 24 hour  Intake 2499.23 ml  Output 2048 ml  Net 451.23 ml   Filed Weights   06/15/18 0123 06/16/18 0141 07-03-18 0439  Weight: 93.7 kg 92.5 kg 92.8 kg    Examination:  General exam: In significant respiratory distress. Respiratory system: Decreased air entry, with expiratory wheeze.   Cardiovascular system: S1 & S2 heard Gastrointestinal system: Mildly obese, soft and nontender. No organomegaly or masses felt. Normal bowel sounds heard. Central nervous system: Non-responsive. Extremities: 2-2+ leg edema.  Data Reviewed: I have personally reviewed following labs and imaging studies  CBC: Recent Labs  Lab 06/13/18 0509  06/14/18 0546 06/15/18 0532 06/16/18 0446 06/16/18 0502 07-03-18 0015 07-03-2018 0400 Jul 03, 2018 0409  WBC 7.8  --  8.6 8.6 11.7*  --  13.5* 13.1*  --   NEUTROABS 6.8  --  7.0 6.7 9.5*  --   --  11.3*  --   HGB 12.5*   < > 11.4* 11.2* 11.6* 10.9* 12.1* 11.3* 11.2*  HCT 40.2   < >  36.0* 33.4* 36.6* 32.0* 36.9* 35.6* 33.0*  MCV 101.5*  --  101.1* 98.8 99.2  --  100.5* 100.8*  --   PLT PLATELET CLUMPS NOTED ON SMEAR, COUNT APPEARS DECREASED  --  114* 125* 139*  --  152 159   147*  --    < > = values in this interval not displayed.   Basic Metabolic Panel: Recent Labs  Lab 06/11/18 2131 06/12/18 0203  06/12/18 1700  06/13/18 0509  06/14/18 0546 06/15/18 0532  06/16/18 0446 06/16/18 0502 2018-07-05 0400 2018-07-05 0409  NA  --   --    < >  --    < > 140   < > 140 142 145 146* 147* 148*  K  --   --    < >  --    < > 4.5   < > 3.6 3.6 4.1 3.9 4.1 4.0  CL  --   --   --   --   --  109  --  106 107 110  --  111  --   CO2  --   --   --   --   --  21*  --  _0 --  24  --   GLUCOSE  --   --   --   --   --  118*  --  132* 153* 170*  --  211*  --   BUN  --   --   --   --   --  45*  --  53* 64* 66*  --  86*  --   CREATININE  --   --   --   --   --  1.73*  --  1.98* 1.91* 1.77*  --  1.96*  --   CALCIUM  --   --   --   --   --  7.7*  --  8.3* 8.4* 8.6*  --  8.3*  --   MG 1.7 1.7  --  1.8  --  1.5*  --   --  2.0 2.1  --  2.3  --   PHOS 3.8 4.0  --  3.0  --  2.6  --   --   --   --   --   --   --    < > = values in this interval not displayed.   GFR: Estimated Creatinine Clearance: 32.6 mL/min (A) (by C-G formula based on SCr of 1.96 mg/dL (H)). Liver Function Tests: Recent Labs  Lab 06/10/18 1800 06/16/18 0446  AST 88* 26  ALT 104* 21  ALKPHOS 32* 63  BILITOT 0.7 2.2*  PROT 4.8* 4.8*  ALBUMIN 2.5* 1.5*   No results for input(s): LIPASE, AMYLASE in the last 168 hours. No results for input(s): AMMONIA in the last 168 hours. Coagulation Profile: Recent Labs  Lab 06/13/18 0509 06/14/18 0546 06/15/18 0532 06/16/18 0446 2018-07-05 0400  INR 2.2* 1.8* 1.7* 1.6* 1.9*   1.9*   Cardiac Enzymes: Recent Labs  Lab 06/10/18 1800  TROPONINI 1.55*   BNP (last 3 results) No results for input(s): PROBNP in the last 8760 hours. HbA1C: No results for input(s): HGBA1C in the last 72 hours. CBG: Recent Labs  Lab 06/16/18 1557 06/16/18 2124 Jul 05, 2018 0014 Jul 05, 2018 0403 2018-07-05 0804  GLUCAP 158* 165* 151* 190* 163*   Lipid Profile: No results for input(s): CHOL, HDL, LDLCALC, TRIG, CHOLHDL, LDLDIRECT in the last 72 hours. Thyroid Function Tests: No  results for input(s): TSH, T4TOTAL, FREET4, T3FREE, THYROIDAB in the last 72 hours. Anemia  Panel: No results for input(s): VITAMINB12, FOLATE, FERRITIN, TIBC, IRON, RETICCTPCT in the last 72 hours. Urine analysis: No results found for: COLORURINE, APPEARANCEUR, [place], [place], GLUCOSEU, HGBUR, BILIRUBINUR, KETONESUR, PROTEINUR, UROBILINOGEN, NITRITE, LEUKOCYTESUR Sepsis Labs: _0 (procalcitonin:4,lacticidven:4)  ) Recent Results (from the past 240 hour(s))  Novel Coronavirus, NAA (hospital order; send-out to ref lab)     Status: None   Collection Time: 06/10/18 12:09 AM  Result Value Ref Range Status   SARS-CoV-2, NAA NOT DETECTED NOT DETECTED Final    Comment: Performed at [facility]  Final    Comment: Performed at [facility], [place] [address], [place], [place] [address]  MRSA PCR Screening     Status: None   Collection Time: 06/10/18 12:09 AM  Result Value Ref Range Status   MRSA by PCR NEGATIVE NEGATIVE Final    Comment:        The GeneXpert MRSA Assay (FDA approved for NASAL specimens only), is one component of a comprehensive MRSA colonization surveillance program. It is not intended to diagnose MRSA infection nor to guide or monitor treatment for MRSA infections. Performed at [facility], [place] [address], [place], [place] [address]   Culture, respiratory (non-expectorated)     Status: None   Collection Time: 06/13/18  6:45 AM  Result Value Ref Range Status   Specimen Description TRACHEAL ASPIRATE  Final   Special Requests NONE  Final   Gram Stain   Final    RARE WBC PRESENT,BOTH PMN AND MONONUCLEAR RARE GRAM POSITIVE COCCI IN PAIRS    Culture   Final    FEW Consistent with normal respiratory flora. Performed at [facility], [place] [address] [place], [place] [address]    Report Status 06/15/2018 FINAL  Final  Culture, blood (Routine X 2) w Reflex to ID Panel     Status: Abnormal   Collection Time: 06/13/18 11:40 PM  Result Value Ref Range Status   Specimen Description BLOOD RIGHT HAND   Final   Special Requests   Final    BOTTLES DRAWN AEROBIC ONLY Blood Culture results may not be optimal due to an inadequate volume of blood received in culture bottles   Culture  Setup Time   Final    AEROBIC BOTTLE ONLY GRAM POSITIVE COCCI CRITICAL RESULT CALLED TO, READ BACK BY AND VERIFIED WITH: L SEAY PHARMD 06/15/18 0050    Culture (A)  Final    STAPHYLOCOCCUS SPECIES (COAGULASE NEGATIVE) THE SIGNIFICANCE OF ISOLATING THIS ORGANISM FROM A SINGLE SET OF BLOOD CULTURES WHEN MULTIPLE SETS ARE DRAWN IS UNCERTAIN. PLEASE NOTIFY THE MICROBIOLOGY DEPARTMENT WITHIN ONE WEEK IF SPECIATION AND SENSITIVITIES ARE REQUIRED. Performed at [facility], [place] [address], [place], [place] [address]    Report Status Jul 11, 2018 FINAL  Final  Blood Culture ID Panel (Reflexed)     Status: Abnormal   Collection Time: 06/13/18 11:40 PM  Result Value Ref Range Status   Enterococcus species NOT DETECTED NOT DETECTED Final   Listeria monocytogenes NOT DETECTED NOT DETECTED Final   Staphylococcus species DETECTED (A) NOT DETECTED Final    Comment: Methicillin (oxacillin) susceptible coagulase negative staphylococcus. Possible blood culture contaminant (unless isolated from more than one blood culture draw or clinical case suggests pathogenicity). No antibiotic treatment is indicated for blood  culture contaminants. CRITICAL RESULT CALLED TO, READ BACK BY AND VERIFIED WITH: L  SEAY PHARMD 06/15/18 0050 JDW    Staphylococcus aureus (BCID) NOT DETECTED NOT DETECTED Final   Methicillin resistance NOT DETECTED NOT DETECTED Final   Streptococcus species NOT DETECTED NOT DETECTED Final   Streptococcus agalactiae NOT DETECTED NOT DETECTED Final   Streptococcus pneumoniae NOT DETECTED NOT DETECTED Final   Streptococcus pyogenes NOT DETECTED NOT DETECTED Final   Acinetobacter baumannii NOT DETECTED NOT DETECTED Final   Enterobacteriaceae species NOT DETECTED NOT DETECTED Final   Enterobacter cloacae complex NOT  DETECTED NOT DETECTED Final   Escherichia coli NOT DETECTED NOT DETECTED Final   Klebsiella oxytoca NOT DETECTED NOT DETECTED Final   Klebsiella pneumoniae NOT DETECTED NOT DETECTED Final   Proteus species NOT DETECTED NOT DETECTED Final   Serratia marcescens NOT DETECTED NOT DETECTED Final   Haemophilus influenzae NOT DETECTED NOT DETECTED Final   Neisseria meningitidis NOT DETECTED NOT DETECTED Final   Pseudomonas aeruginosa NOT DETECTED NOT DETECTED Final   Candida albicans NOT DETECTED NOT DETECTED Final   Candida glabrata NOT DETECTED NOT DETECTED Final   Candida krusei NOT DETECTED NOT DETECTED Final   Candida parapsilosis NOT DETECTED NOT DETECTED Final   Candida tropicalis NOT DETECTED NOT DETECTED Final    Comment: Performed at [facility], [place] [address], [place], [place] [address]  Culture, blood (Routine X 2) w Reflex to ID Panel     Status: None (Preliminary result)   Collection Time: 06/13/18 11:45 PM  Result Value Ref Range Status   Specimen Description BLOOD RIGHT HAND  Final   Special Requests   Final    BOTTLES DRAWN AEROBIC ONLY Blood Culture results may not be optimal due to an inadequate volume of blood received in culture bottles   Culture   Final    NO GROWTH 3 DAYS Performed at [facility], [place] [address], [place], [place] [address]    Report Status PENDING  Incomplete  Culture, respiratory (non-expectorated)     Status: None   Collection Time: 06/14/18  1:03 AM  Result Value Ref Range Status   Specimen Description TRACHEAL ASPIRATE  Final   Special Requests NONE  Final   Gram Stain   Final    ABUNDANT WBC PRESENT, PREDOMINANTLY PMN MODERATE GRAM NEGATIVE RODS MODERATE GRAM POSITIVE COCCI RARE YEAST Performed at [facility], [address] [place], [place] [address]    Culture MODERATE ENTEROBACTER CANCEROGENUS  Final   Report Status 06/16/2018 FINAL  Final   Organism ID, Bacteria ENTEROBACTER CANCEROGENUS  Final       Susceptibility   Enterobacter cancerogenus - MIC*    CEFAZOLIN <=4 SENSITIVE Sensitive     CEFEPIME <=1 SENSITIVE Sensitive     CEFTAZIDIME <=1 SENSITIVE Sensitive     CEFTRIAXONE <=1 SENSITIVE Sensitive     CIPROFLOXACIN <=0.25 SENSITIVE Sensitive     GENTAMICIN <=1 SENSITIVE Sensitive     IMIPENEM <=0.25 SENSITIVE Sensitive     TRIMETH/SULFA <=20 SENSITIVE Sensitive     PIP/TAZO <=4 SENSITIVE Sensitive     * MODERATE ENTEROBACTER CANCEROGENUS  SARS Coronavirus 2 [facility] order, Performed in [facility] lab)     Status: None   Collection Time: 06/14/18  2:02 PM  Result Value Ref Range Status   SARS Coronavirus 2 NEGATIVE NEGATIVE Final    Comment: (NOTE) If result is NEGATIVE SARS-CoV-2 target nucleic acids are NOT DETECTED. The SARS-CoV-2 RNA is generally detectable in upper and lower  respiratory specimens during the acute phase of  infection. The lowest  concentration of SARS-CoV-2 viral copies this assay can detect is 250  copies / mL. A negative result does not preclude SARS-CoV-2 infection  and should not be used as the sole basis for treatment or other  patient management decisions.  A negative result may occur with  improper specimen collection / handling, submission of specimen other  than nasopharyngeal swab, presence of viral mutation(s) within the  areas targeted by this assay, and inadequate number of viral copies  (<250 copies / mL). A negative result must be combined with clinical  observations, patient history, and epidemiological information. If result is POSITIVE SARS-CoV-2 target nucleic acids are DETECTED. The SARS-CoV-2 RNA is generally detectable in upper and lower  respiratory specimens dur ing the acute phase of infection.  Positive  results are indicative of active infection with SARS-CoV-2.  Clinical  correlation with patient history and other diagnostic information is  necessary to determine patient infection status.  Positive results do  not  rule out bacterial infection or co-infection with other viruses. If result is PRESUMPTIVE POSTIVE SARS-CoV-2 nucleic acids MAY BE PRESENT.   A presumptive positive result was obtained on the submitted specimen  and confirmed on repeat testing.  While 2019 novel coronavirus  (SARS-CoV-2) nucleic acids may be present in the submitted sample  additional confirmatory testing may be necessary for epidemiological  and / or clinical management purposes  to differentiate between  SARS-CoV-2 and other Sarbecovirus currently known to infect humans.  If clinically indicated additional testing with an alternate test  methodology [phone]) is advised. The SARS-CoV-2 RNA is generally  detectable in upper and lower respiratory sp ecimens during the acute  phase of infection. The expected result is Negative. Fact Sheet for Patients:  StrictlyIdeas.no Fact Sheet for Healthcare Providers: BankingDealers.co.za This test is not yet approved or cleared by the [place] FDA and has been authorized for detection and/or diagnosis of SARS-CoV-2 by FDA under an Emergency Use Authorization (EUA).  This EUA will remain in effect (meaning this test can be used) for the duration of the COVID-19 declaration under Section 564(b)(1) of the Act, 21 U.S.C. section 360bbb-3(b)(1), unless the authorization is terminated or revoked sooner. Performed at [facility], [place] [address] [place], [place] [address]          Radiology Studies: Ct Head Wo Contrast  Result Date: 06/16/2018 CLINICAL DATA:  Cardiac arrest. Rule out anoxic brain injury. Presumptive positive COVID-19. EXAM: CT HEAD WITHOUT CONTRAST TECHNIQUE: Contiguous axial images were obtained from the base of the skull through the vertex without intravenous contrast. COMPARISON:  Brain MRI 05/03/2018 FINDINGS: Brain: There is no evidence of acute infarct, intracranial hemorrhage, mass, midline shift, or  extra-axial fluid collection. The ventricles and sulci are within normal limits for age. A small chronic infarct in the right cerebellum is unchanged. Vascular: Calcified atherosclerosis at the skull base. No hyperdense vessel. Skull: No fracture or focal osseous lesion. Sinuses/Orbits: Complete opacification of the left maxillary and left sphenoid sinuses. Moderate mucosal thickening and small volume fluid in the right maxillary and right sphenoid sinuses. Mild bilateral ethmoid air cell mucosal thickening. Bilateral mastoid and middle ear effusions. Unremarkable orbits. Other: Partially visualized endotracheal and orogastric tubes. IMPRESSION: No evidence of acute intracranial abnormality. Electronically Signed   By: Logan Bores M.D.   On: 06/16/2018 15:25   Dg Chest Port 1 View  Result Date: 06/16/2018 CLINICAL DATA:  Endotracheal tube present. Fever. Increased respiratory rate. PUI. EXAM: PORTABLE CHEST  1 VIEW COMPARISON:  One-view chest x-ray 06/13/2018 and 06/12/2018. FINDINGS: The heart is mildly enlarged, exaggerated by low lung volumes. Endotracheal tube terminates just above the carina and could be pulled back 2-3 cm for more optimal positioning. Diffuse interstitial and airspace disease is again seen. Patchy airspace opacities are more prominent on the left and less prominent in the right perihilar area. IMPRESSION: 1. Migrating patchy interstitial and airspace disease concerning for infection or ARDS. 2. Endotracheal tube terminates just above the carina and could be pulled back 2-3 cm for more optimal positioning. 3. Support apparatus is otherwise stable. These results will be called to the ordering clinician or representative by the Radiologist Assistant, and communication documented in the PACS or zVision Dashboard. Electronically Signed   By: San Morelle M.D.   On: 06/16/2018 08:27        Scheduled Meds:  amiodarone  400 mg Per Tube BID   Followed by   Derrill Memo ON 06/19/2018]  amiodarone  400 mg Per Tube Daily   chlorhexidine gluconate (MEDLINE KIT)  15 mL Mouth Rinse BID   Chlorhexidine Gluconate Cloth  6 each Topical Q0600   chlorproMAZINE  25 mg Per Tube TID   feeding supplement (PRO-STAT SUGAR FREE 64)  60 mL Per Tube TID   free water  200 mL Per Tube Q4H   [START ON 06/18/2018] furosemide  40 mg Intravenous Daily   insulin aspart  0-20 Units Subcutaneous Q4H   lidocaine  2 patch Transdermal Q24H   mouth rinse  15 mL Mouth Rinse 10 times per day   [START ON 06/18/2018] pantoprazole sodium  40 mg Per Tube Q1200   potassium chloride  40 mEq Per Tube Daily   sodium chloride  1 spray Each Nare Q2H while awake   sodium chloride flush  10-40 mL Intracatheter Q12H   Continuous Infusions:  sodium chloride 10 mL/hr at 2018/07/17 0400   ceFEPime (MAXIPIME) IV 2 g (07-17-2018 1149)   feeding supplement (OSMOLITE 1.5 CAL)       LOS: 8 days   Dana Allan, MD  [facility] Pager #: [phone] 7PM-7AM contact night coverage as above

## 2018-06-30 NOTE — Progress Notes (Signed)
Clifton Custard (patient's son) called RN and expressed some things on patient's living will. RN communicated to Dr. Vassie Loll that family would like to speak with a physician to go over goals of care. Dr. Vassie Loll told RN to allow family to come to bedside to meet via [facility] and discuss further care.  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 RN paged chaplain to bedside when family arrives.

## 2018-06-30 NOTE — Plan of Care (Signed)
  Problem: Clinical Measurements: Goal: Ability to maintain clinical measurements within normal limits will improve Outcome: Progressing Goal: Diagnostic test results will improve Outcome: Progressing Goal: Respiratory complications will improve Outcome: Progressing Goal: Cardiovascular complication will be avoided Outcome: Progressing   Problem: Activity: Goal: Risk for activity intolerance will decrease Outcome: Progressing   Problem: Nutrition: Goal: Adequate nutrition will be maintained Outcome: Progressing   Problem: Coping: Goal: Level of anxiety will decrease Outcome: Progressing   Problem: Elimination: Goal: Will not experience complications related to bowel motility Outcome: Progressing Goal: Will not experience complications related to urinary retention Outcome: Progressing   Problem: Pain Managment: Goal: General experience of comfort will improve Outcome: Progressing   Problem: Safety: Goal: Ability to remain free from injury will improve Outcome: Progressing   Problem: Cardiac: Goal: Ability to achieve and maintain adequate cardiopulmonary perfusion will improve Outcome: Progressing   Problem: Education: Goal: Knowledge of General Education information will improve Description Including pain rating scale, medication(s)/side effects and non-pharmacologic comfort measures Outcome: Not Progressing   Problem: Health Behavior/Discharge Planning: Goal: Ability to manage health-related needs will improve Outcome: Not Progressing   Problem: Clinical Measurements: Goal: Will remain free from infection Outcome: Not Progressing   Problem: Skin Integrity: Goal: Risk for impaired skin integrity will decrease Outcome: Not Progressing Note:  2 new skin tears noted to sacrum, pressure related wound to left outer mid ear.   Problem: Education: Goal: Ability to manage disease process will improve Outcome: Not Progressing   Problem: Neurologic: Goal: Promote  progressive neurologic recovery Outcome: Not Progressing   Problem: Skin Integrity: Goal: Risk for impaired skin integrity will be minimized. Outcome: Not Progressing

## 2018-06-30 NOTE — Progress Notes (Signed)
Patient noted with significant nose bleed. On call MD notified and updated. E-Link about to camera in and view patient. Patient with bright red frank blood coming out of mouth, up from ET tube, out of his nares. Deep oral suction noted to present large blood clots from blood draining down back of throat. Orders received to stop IV Heparin infusion, one time dose of Afrin ordered and administered. MD to assess patient at bedside. Orders received for TXA to be administered. Previous orders slowed bleeding but did not completely stop the bleeding. Blood from mouth and blood clots from deep oral suction continued to be removed. Consult obtained for ENT specialist. ENT MD assessed patient at bedside but by the time of assessment bleeding did officially completely stop. CBC obtained to assess hemoglobin. Patients BP at time of nose bleed and blood coming up from ET tube noted with SBP > 260. PRN Labetalol administered as ordered and noted to be effective. SBP decreased to 150s. Oral care completed at end of shift noted with no new frank blood coming from mouth, minimal blood residue continuing to be cleaned out.

## 2018-06-30 NOTE — Consult Note (Signed)
Responded to nurse's call after family arrived. They were seated outside the glass doors and had a good view of the pt. Offered prayer and spiritual/emotional/grief support.We all prayed together the Lord's prayer at the end. Gave family copy of Psalms and New Testament. Invited them to call again if chaplain services are desired.   Rev. Donnel Saxon Chaplain

## 2018-06-30 NOTE — Progress Notes (Signed)
   Jun 28, 2018 2210  Clinical Encounter Type  Visited With Family  Visit Type Initial;Spiritual support  Referral From Chaplain  Consult/Referral To Chaplain  This chaplain was made aware of Pt. withdrawal of care from daytime chaplain.  This chaplain introduced herself to the Unit Sec. and Pt. family-3 sons:Donald, Georgeanna Lea and Pt. Wife.  The chaplain was pastorally present with the family. In the time together prayer was requested for a peaceful passing.  The chaplain will provide F/U spiritual care as needed.

## 2018-06-30 NOTE — Plan of Care (Signed)
PCCM Plan of Care note Evaluated [name] at bedside for epistaxis.  He is a 78 year old man admitted on 4/10 after V-fib arrest at home with estimated downtime of 15 times. His hospitalization is remarkable for persistent fever and respiratory failure with ARDS thought to be due to PNA and possible COVID 19 (he has had two negative nasal swabs and a confirmatory tracheal aspirate is pending) as well as persistent encephalopathy thought to be due to hypoxic/anoxic brain injury.  He has been on heparin gtt for atrial fibrillation.  He had an episode of extreme hypertension with systolic blood pressure in the 200's followed by sudden anterior and posterior epistaxis.  He had mild O2 desaturation and his FIO2 was bumped to 100%. Blood suctioned from both his ETT and from his oropharynx. Labetalol given with decrease in blood pressure. Nasal pressure applied with decrease in epistaxis but not complete resolution.   BP (!) 144/64   Pulse 72   Temp 98.9 F (37.2 C) (Axillary)   Resp (!) 25   Ht 5\' 6"  (1.676 m)   Wt 92.5 kg   SpO2 100%   BMI 32.91 kg/m    Ongoing nasal pressure being applied  Assessment and Plan 77 YO with acute onset anterior and posterior epistaxis in the setting of hypertension and anticoagulation with heparin.  -Heparin gtt discontinued -Gave 2 sprays of oxymetazoline in each nostril with significant slowing down in bleeding -Will give a dose of tranexamic acid -Stat CBC obtained- Hgb 12.1 (was 11.6 on 4/17 at 4 am) -Stat ABG obtained. 7.48/39/357 FIO2 decreased to 40% and PEEP decreased to 8 -Hopefully after he receives the tranexamic acid the bleeding will completely resolve. If it is persistent after that will obtain an urgent ENT consult for rhinoscopy and packing.  Critical Care time: 5 Minutes  Larna Daughters, M.D. [facility] Pulmonary/Critical Care Medicine. Pager:  After hours pager: [phone].

## 2018-06-30 NOTE — Progress Notes (Signed)
78 year old man admitted 4/10 after V. fib arrest at home received bystander CPR, estimated downtime about 15 minutes, underwent TTM  Course was complicated by acute encephalopathy and ARDS and persistent fever, he has a history of COVID contact and even though 2 swabs were negative, he was placed under isolation due to ongoing fever and tracheal aspirate was sent for repeat testing  4/18 developed epistaxis overnight, heparin stopped, seen by ENT  He remains unresponsive, critically ill, on 60%/PEEP of 8 Nasal bleeding has decreased  Labs show mild hyponatremia, PO2 of 76 on ABG and mild leukocytosis with stable anemia  Head CT 4/17 does not show any edema or shift.  Shows left maxillary and sphenoid sinusitis Chest x-ray 4/17 personally reviewed which shows bilateral airspace disease  Impression/plan Main issue remains anoxic encephalopathy -unfortunately MRI & neuro prognostication has been delayed due to repeat COVID testing.  Head CT repeated on 4/17 not helpful.  ARDS due to Enterobacter pneumonia -continue ceftriaxone He was tested negative for COVID x2 with nasopharyngeal swab and tracheal aspirate has been sent due to persistent fever  VF cardiac arrest -ischemia evaluation has been deferred due to neurological status Atrial fibrillation-continue amiodarone, heparin has been discontinued due to epistaxis on 4/18  Prognosis is poor for neurological recovery 8 days after initial catastrophic event  The patient is critically ill with multiple organ systems failure and requires high complexity decision making for assessment and support, frequent evaluation and titration of therapies, application of advanced monitoring technologies and extensive interpretation of multiple databases. Critical Care Time devoted to patient care services described in this note independent of APP/resident  time is 32 minutes.   Comer Locket Vassie Loll mD

## 2018-06-30 NOTE — Consult Note (Signed)
Responded to page, went to meet family arriving from about 1/2 hr. away at bedside of pt for withdrawal of care. Met with nurse, as family had not yet arrived. Pt is being treated as if he had covid exposure--tests are negative but he's deemed to have symptoms. Thus family will see me in hall, and, unless anything changes, will not enter room. Nurse advised that son now had information from pt's will to convey to doctor re: his wishes for care, and family will now have [place] with doctor when they arrive. Nurse will call me again to come down when they are ready.Wife had told nurse they were Darrick Meigs, & requested chaplain.  Standing by.  Rev. [place]

## 2018-06-30 NOTE — Consult Note (Signed)
OTOLARYNGOLOGY CONSULTATION    Primary Care Physician: System, Pcp Not In Patient Location at Initial Consult: Inpatient Service: ICU Chief Complaint/Reason for Consult: epistaxis  History of Presenting Illness:  History obtained from EMR, nursing staff. Ross Lewis is a  78 y.o. male presenting with several hours of epistaxis.  This has seemed to come from both sides of the nose.  Unsure which side it started on.  It has been intermittent.  He has recently been passing some smaller clots of blood.  He was previously on a heparin drip.  This has been discontinued.  Bleeding has slowly improved since stopping the heparin drip.  IV TXA was also administered.    He was admitted to the hospital 1 week ago after 2 episodes of cardiac arrest.  He has remained febrile with ARDS.  He did have a COVID-19 exposure at home prior to this.  He has had 2- COVID tests performed so far.  A COVID sampling of his tracheal aspirate is still pending.  Since admission, patient has remained unresponsive except for coughing to deep suction.  History reviewed. No pertinent surgical history.  History reviewed. No pertinent family history.  Social History   Socioeconomic History  . Marital status: Married    Spouse name: Not on file  . Number of children: Not on file  . Years of education: Not on file  . Highest education level: Not on file  Occupational History  . Not on file  Social Needs  . Financial resource strain: Not on file  . Food insecurity:    Worry: Not on file    Inability: Not on file  . Transportation needs:    Medical: Not on file    Non-medical: Not on file  Tobacco Use  . Smoking status: Not on file  Substance and Sexual Activity  . Alcohol use: Not on file  . Drug use: Not on file  . Sexual activity: Not on file  Lifestyle  . Physical activity:    Days per week: Not on file    Minutes per session: Not on file  . Stress: Not on file  Relationships  . Social connections:     Talks on phone: Not on file    Gets together: Not on file    Attends religious service: Not on file    Active member of club or organization: Not on file    Attends meetings of clubs or organizations: Not on file    Relationship status: Not on file  Other Topics Concern  . Not on file  Social History Narrative  . Not on file    No current facility-administered medications on file prior to encounter.    Current Outpatient Medications on File Prior to Encounter  Medication Sig Dispense Refill  . acetaminophen (TYLENOL) 500 MG tablet Take 500 mg by mouth every 6 (six) hours as needed for headache (pain).    Marland Kitchen diltiazem (CARTIA XT) 120 MG 24 hr capsule Take 120 mg by mouth daily.    . Menthol, Topical Analgesic, (ICY HOT EX) Apply 1 application topically daily as needed (muscle pain).    Marland Kitchen omeprazole (PRILOSEC) 40 MG capsule Take 40 mg by mouth daily as needed (acid reflux).    . tamsulosin (FLOMAX) 0.4 MG CAPS capsule Take 0.4 mg by mouth daily.     Marland Kitchen warfarin (COUMADIN) 5 MG tablet Take 5-7.5 mg by mouth See admin instructions. Take 1 1/2 tablets (7.5 mg) on Fridays, take 1 tablet (5 mg)  on all other days of the week      No Known Allergies   Review of Systems: Unable to complete secondary to patient's mental status as he is intubated and sedated  OBJECTIVE: Vital Signs: Vitals:   06/16/18 2200 06/20/2018 0017  BP:    Pulse: 87 72  Resp: (!) 21 (!) 25  Temp:    SpO2: 100% 100%    I&O  Intake/Output Summary (Last 24 hours) at 06/01/2018 0431 Last data filed at 06/16/2018 2300 Gross per 24 hour  Intake 1431.09 ml  Output 2198 ml  Net -766.91 ml    Physical Exam General:  Intubated, sedated, ill-appearing.   Limited time in exam room due to patient's possible coronavirus exposure.  Head/Face: Normocephalic, atraumatic. No scars or lesions. No sinus tenderness. Facial nerve intact and equal bilaterally.  No facial lacerations. Salivary glands non tender and without  palpable masses  Eyes: Globes well positioned, no proptosis Lids: No periorbital edema/ecchymosis. No lid laceration Conjunctiva: No chemosis, hemorrhage   Ears: No gross deformity.    Hearing:  Unable to assess  Nose: No gross deformity or lesions. No purulent discharge. Septum midline. No turbinate hypertrophy.  Afrin sprayed in the nose and both sides the nose were suctioned back to the nasopharynx without any active nose bleeding on either side  Mouth/Oropharynx: Lips without any lesions. Dentition normal for age. No mucosal lesions within the oropharynx. No tonsillar enlargement, exudate, or lesions. Pharyngeal walls symmetrical. Uvula midline. Tongue midline without lesions.  Endotracheal tube makes exam somewhat limited but there is no clot in the posterior oropharynx  Neck:  Did not assess  Lymphatic:  Not assessed  Respiratory: No stridor or distress. Vent Mode: PRVC FiO2 (%):  [40 %-100 %] 100 % Set Rate:  [24 bmp] 24 bmp Vt Set:  [510 mL] 510 mL PEEP:  [10 cmH20-12 cmH20] 10 cmH20 Plateau Pressure:  [17 cmH20-29 cmH20] 20 cmH20    Labs: Lab Results  Component Value Date   WBC 13.5 (H) 06/08/2018   HGB 11.2 (L) 06/12/2018   HCT 33.0 (L) 06/18/2018   PLT 152 06/18/2018   ALT 21 06/16/2018   AST 26 06/16/2018   NA 148 (H) 06/22/2018   K 4.0 06/06/2018   CL 110 06/16/2018   CREATININE 1.77 (H) 06/16/2018   BUN 66 (H) 06/16/2018   CO2 29 06/16/2018   INR 1.6 (H) 06/16/2018     Review of Ancillary Data / Diagnostic Tests: Hemoglobin 11.2  Reviewed critical care notes.  06/14/2018 COVID test negative.  ASSESSMENT:  78 y.o. male with epistaxis.  He seems to have resolved.  He is now off his heparin drip which is likely why his epistaxis has resolved.  I suctioned out both sides of the nasopharynx as well as in the mouth to ensure there is no further bleeding and indeed there was not.  No packing was placed in his nose.  Standard Epistaxis  RECOMMENDATIONS: 1. Thrombocytopenia certainly increases risk of bleeding in this cold dry environment. Platelet transfusions as needed. 2. Maintain tight BP control, as hypertension will certainly increase epistaxis occurrences.  3. Reduce nasal manipulation - no nose picking, no tissues in the nose other than for dabbing. No nose blowing x 72 hours.    4. Once one epistaxis episode occurs, it is common for some minor dripping to occur again over the next couple of days. If it does, use Afrin nasal spray for ACTIVE bleeding on the bleeding side and hold very  firm anterior nasal pressure for 10-15 mins for ACTIVE bleeding. Put the bottle of Afrin parallel to the floor and pour medicine in nose, not spray. Recommend keeping a bottle of Afrin at the bedside just in case. 5. Use Ocean nasal spray q2 hr while awake to moisten the nasal mucosa to decrease nasal crusting and cracking. Use nasal saline spray for congestion instead of blowing the nose. After using the nasal saline spray, there may be some old blood that comes out with the irrigation; this is normal.  6. If nose continues to bleed bright red blood after pouring Afrin and holding firm pressure for 15 minutes, please contact ENT for assistance.      Misty Stanley, MD  [facility] Office phone [phone]

## 2018-06-30 NOTE — Progress Notes (Signed)
eLink Physician-Brief Progress Note Patient Name: Ross Lewis DOB: Nov 25, 1940 MRN: [id number]   Date of Service  06/29/2018  HPI/Events of Note  Discussion via phone with patient's wife who is a Engineer, civil (consulting).  She is aware that the patient has experienced a significant neurologic event following a witnessed VF arrest.  She understands that the chance for meaningful recovery is non-existent.  She and her family are in agreement that [name] would never have wanted aggressive management of a condition for which he will never recover.  eICU Interventions  Plan: Full DNR Withdrawal of care orders in place Morphine gtt PRNs as needed for patient comfort To be extubated to room air as tolerated     Intervention Category Major Interventions: End of life / care limitation discussion  DETERDING,ELIZABETH 06/07/2018, 7:57 PM

## 2018-06-30 DEATH — deceased

## 2018-07-09 NOTE — Discharge Summary (Signed)
Physician Discharge Summary  Snapper Decelles MWN:[id number] DOB: 19-Mar-1940 DOA: 06/26/2018  PCP: System, Pcp Not In  Admit date: 06/16/2018 Discharge date: Jul 09, 2018  Date of discharge summary: Patient was a 78 year old male past medical history significant for atrial fibrillation, chronic kidney disease, TIA and hypertension.  Patient was admitted following V. fib cardiac arrest.  Patient was defibrillated with ROSC within 16 minutes.  Patient was intubated and admitted for further assessment.  There was concern for possible COVID on presentation, as collateral information revealed possible contact with COVID.  COVID 19 testing was negative on 2 occasions.  However, patient continued to have fever, and a significant respiratory distress, therefore, COVID-19 precautions remained place.  Further testing for COVID-19 was in process.  Sputum culture grew Enterobacter.  Patient remains unresponsive.  After discussion with patient's wife, comfort directed care was pursued.  Patient died on 07-09-18.  For the records, mild contact with the patient was on 2018-07-08.   Discharge Diagnoses:  Active Problems:   Cardiac arrest ([name])   Cardiogenic shock ([name])   Acute respiratory failure with hypoxia ([name])   Ischemic encephalopathy   Unspecified atrial fibrillation ([name])   CKD (chronic kidney disease), stage II   CVA (cerebral vascular accident) ([name])   Anoxic brain injury ([name])   Lobar pneumonia ([name])   ARDS (adult respiratory distress syndrome) ([name])   Pressure injury of skin   Signed:  Berton Mount, MD  [facility] Pager #: [phone] 7PM-7AM contact night coverage as above

## 2019-05-16 IMAGING — CT CT HEAD CODE STROKE
3 of 4 series · 15 of 47 positions shown, 18 images · non-contrast
Comparison: Head CT 10/19/2017 and earlier.

CLINICAL DATA: Code stroke. 77-year-old male with aphasia and
diaphoresis last known well 30 minutes ago.

EXAM:
CT HEAD WITHOUT CONTRAST
TECHNIQUE: Contiguous axial images were obtained from the base of the skull
through the vertex without intravenous contrast.

[Series 2: head code stroke wo · axial · 0.47mm/px · z∈[+50,+175]mm · 9 of 31 slices shown, 12 images]
[im 3/31  brain]
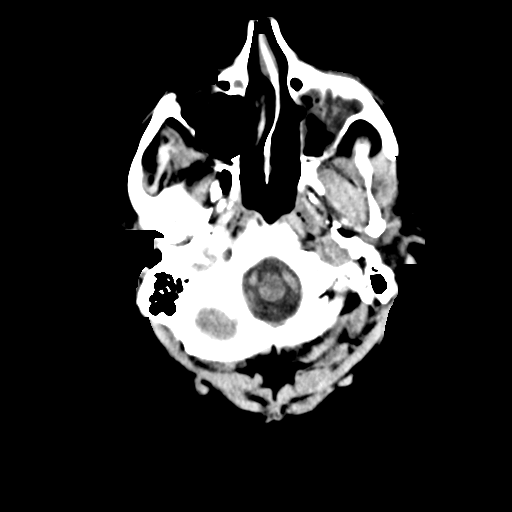
[im 3/31  bone]
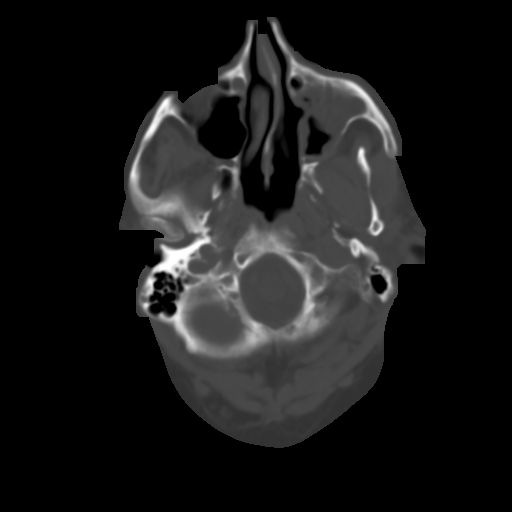
[im 7/31  brain]
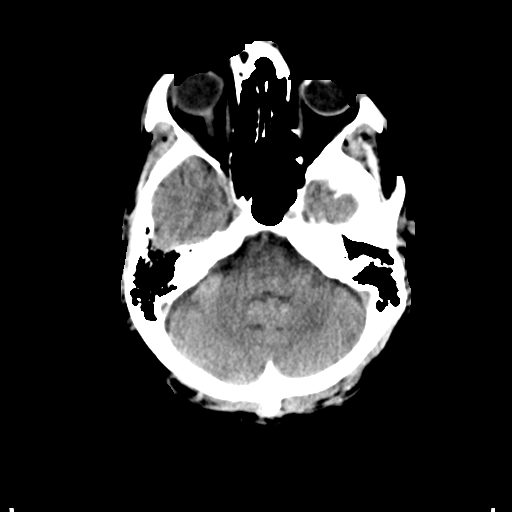
[im 9/31  brain]
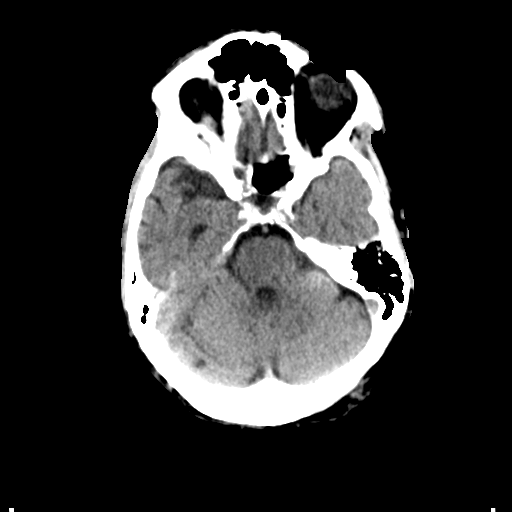
[im 13/31  brain]
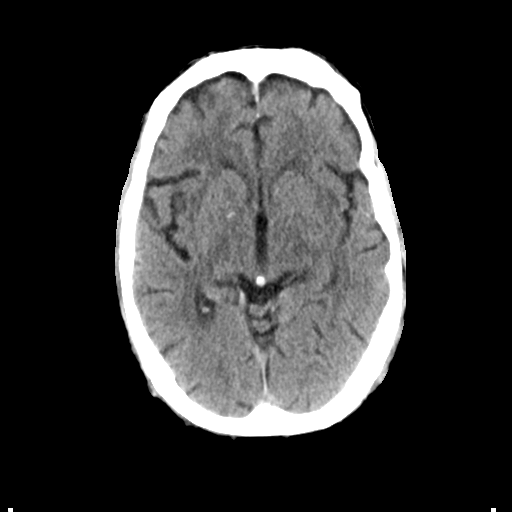
[im 16/31  brain]
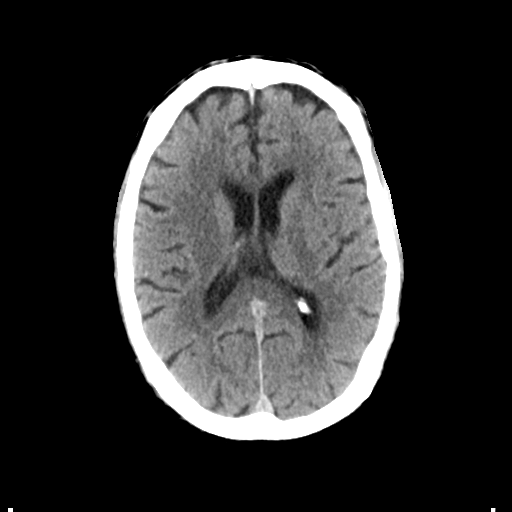
[im 16/31  bone]
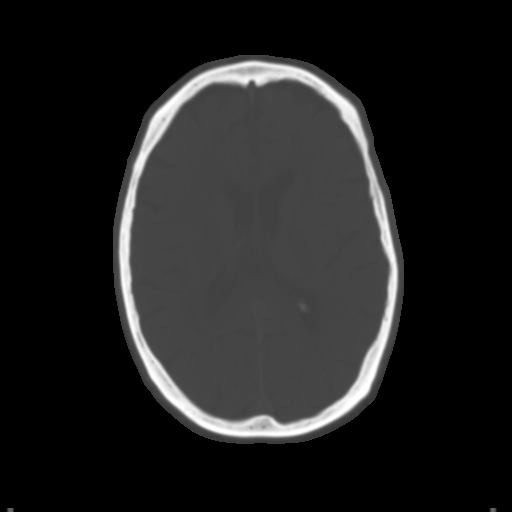
[im 18/31  brain]
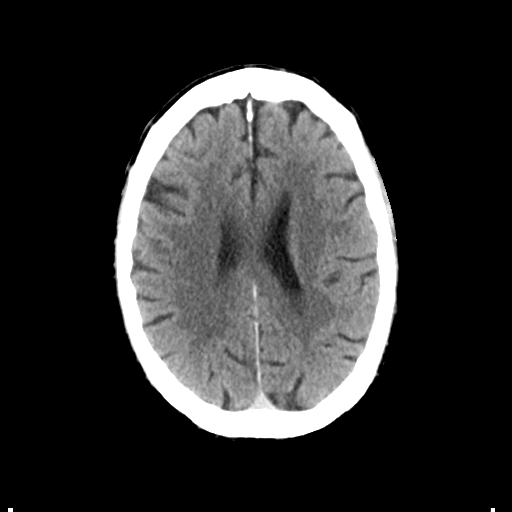
[im 22/31  brain]
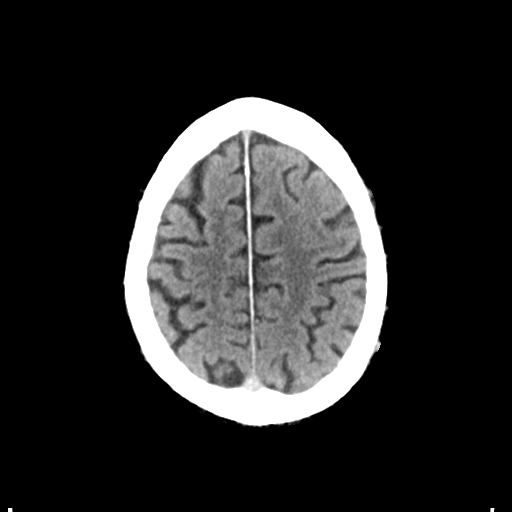
[im 24/31  brain]
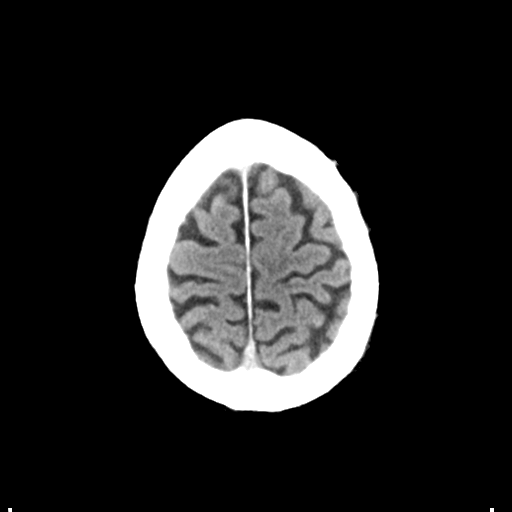
[im 28/31  brain]
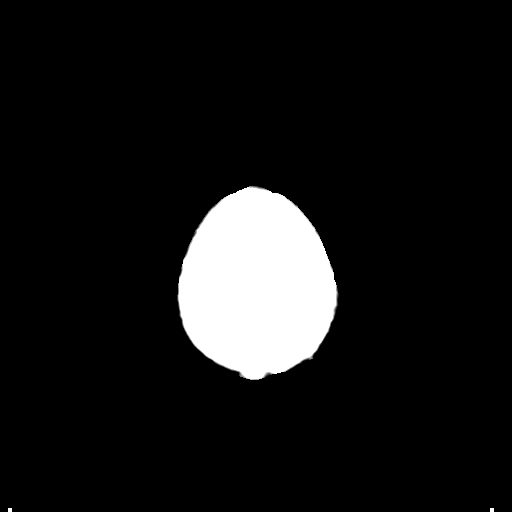
[im 28/31  bone]
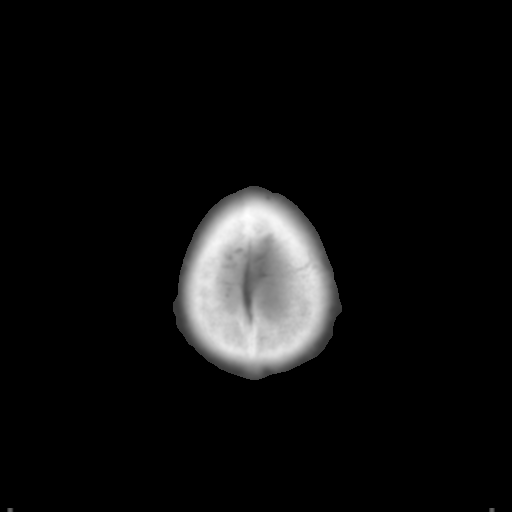

[Series 4: coronal soft tissue · coronal · 0.32mm/px · 3 of 66 slices shown]
[im 22/66  brain]
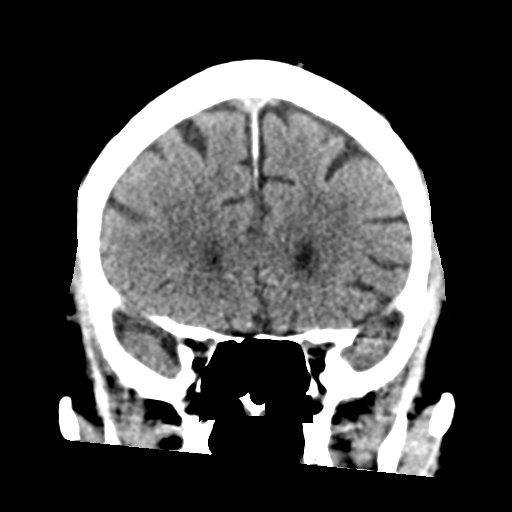
[im 29/66  brain]
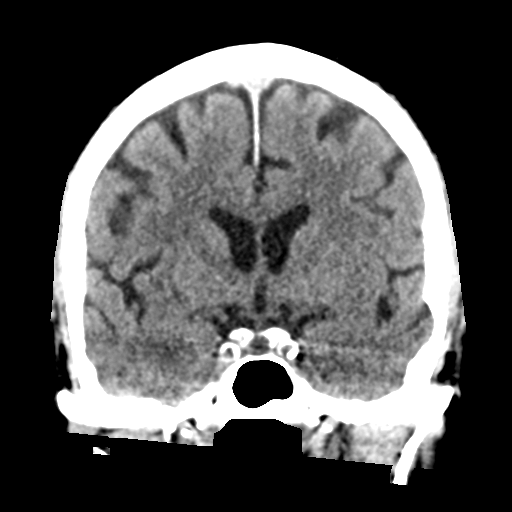
[im 37/66  brain]
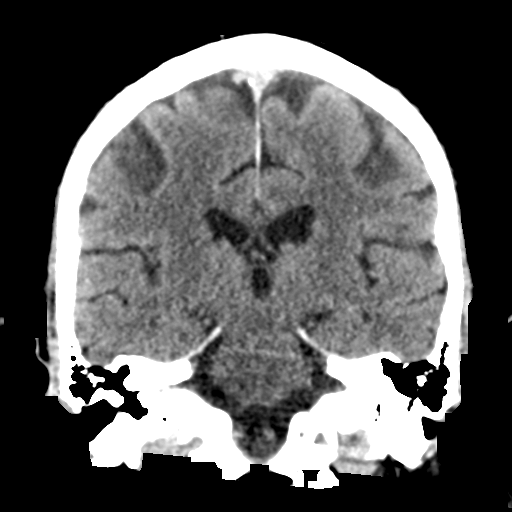

[Series 5: sagittal soft tissue · sagittal · 0.33mm/px · 3 of 57 slices shown]
[im 19/57  brain]
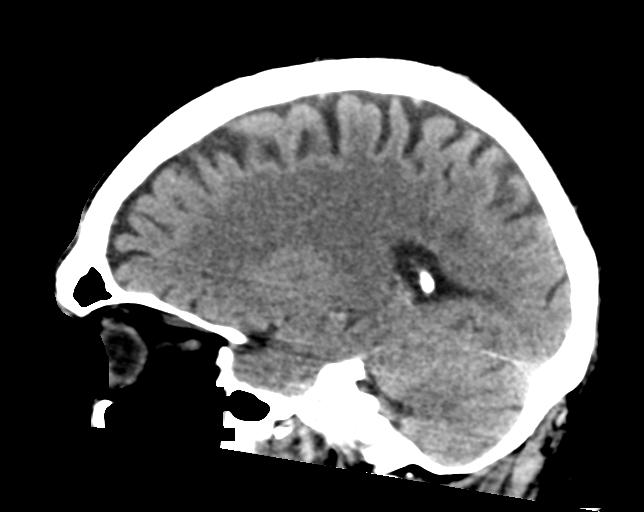
[im 29/57  brain]
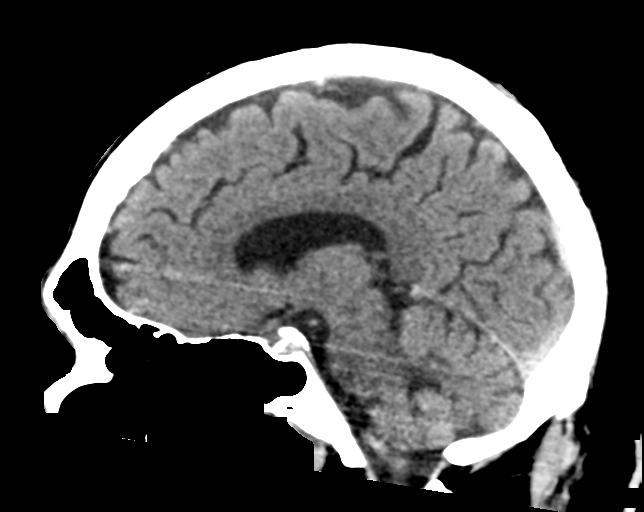
[im 38/57  brain]
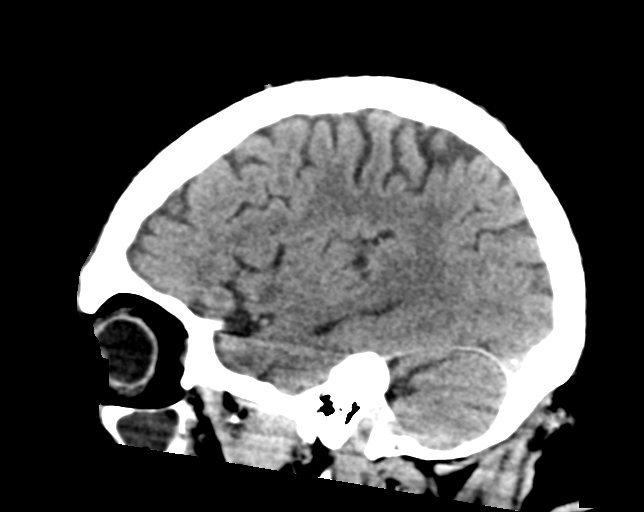

[15 of 47 positions shown; findings below may reference images not displayed]

FINDINGS: Brain: Small chronic infarct in the posterior right cerebellum is
stable. Mild for age patchy white matter hypodensity. No midline
shift, ventriculomegaly, mass effect, evidence of mass lesion,
intracranial hemorrhage or evidence of cortically based acute
infarction.

Vascular: Calcified atherosclerosis at the skull base. No suspicious
intracranial vascular hyperdensity.

Skull: Negative.

Sinuses/Orbits: Chronic left maxillary sinus disease. Other
Visualized paranasal sinuses and mastoids are stable and well
pneumatized.

Other: No acute orbit or scalp soft tissue findings.

ASPECTS (Alberta Stroke Program Early CT Score)

- Ganglionic level infarction (caudate, lentiform nuclei, internal
capsule, insula, M1-M3 cortex): 7

- Supraganglionic infarction (M4-M6 cortex): 3

Total score (0-10 with 10 being normal): 10
IMPRESSION: 1. No acute cortically based infarct or acute intracranial
hemorrhage identified. ASPECTS is 10.
2. Small chronic right cerebellar infarct.
3. Study discussed by telephone with Dr. PANFILO GREENWOOD on
